# Patient Record
Sex: Female | Born: 1990 | Race: Black or African American | Hispanic: No | Marital: Single | Smoking: Former smoker
Health system: Southern US, Community
[De-identification: ages and names within clinical notes are randomized; demographics above are authoritative.]

## PROBLEM LIST (undated history)

## (undated) DIAGNOSIS — F419 Anxiety disorder, unspecified: Secondary | ICD-10-CM

## (undated) DIAGNOSIS — E0590 Thyrotoxicosis, unspecified without thyrotoxic crisis or storm: Secondary | ICD-10-CM

## (undated) DIAGNOSIS — K3184 Gastroparesis: Secondary | ICD-10-CM

## (undated) DIAGNOSIS — E0500 Thyrotoxicosis with diffuse goiter without thyrotoxic crisis or storm: Secondary | ICD-10-CM

## (undated) DIAGNOSIS — F319 Bipolar disorder, unspecified: Secondary | ICD-10-CM

## (undated) DIAGNOSIS — F32A Depression, unspecified: Secondary | ICD-10-CM

---
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
## 2012-01-20 DIAGNOSIS — F3130 Bipolar disorder, current episode depressed, mild or moderate severity, unspecified: Secondary | ICD-10-CM | POA: Insufficient documentation

## 2012-01-20 HISTORY — DX: Bipolar disorder, current episode depressed, mild or moderate severity, unspecified: F31.30

## 2015-10-27 DIAGNOSIS — Z9889 Other specified postprocedural states: Secondary | ICD-10-CM | POA: Insufficient documentation

## 2015-12-02 DIAGNOSIS — F419 Anxiety disorder, unspecified: Secondary | ICD-10-CM | POA: Insufficient documentation

## 2015-12-02 DIAGNOSIS — D72829 Elevated white blood cell count, unspecified: Secondary | ICD-10-CM | POA: Insufficient documentation

## 2016-04-17 DIAGNOSIS — D75839 Thrombocytosis, unspecified: Secondary | ICD-10-CM | POA: Insufficient documentation

## 2017-05-18 DIAGNOSIS — R45851 Suicidal ideations: Secondary | ICD-10-CM | POA: Insufficient documentation

## 2017-05-18 DIAGNOSIS — Z9151 Personal history of suicidal behavior: Secondary | ICD-10-CM

## 2017-05-18 HISTORY — DX: Personal history of suicidal behavior: Z91.51

## 2017-05-19 DIAGNOSIS — F1010 Alcohol abuse, uncomplicated: Secondary | ICD-10-CM

## 2017-05-19 HISTORY — DX: Alcohol abuse, uncomplicated: F10.10

## 2020-07-08 DIAGNOSIS — G8929 Other chronic pain: Secondary | ICD-10-CM

## 2020-07-08 DIAGNOSIS — N946 Dysmenorrhea, unspecified: Secondary | ICD-10-CM

## 2020-07-08 HISTORY — DX: Other chronic pain: G89.29

## 2020-07-08 HISTORY — DX: Dysmenorrhea, unspecified: N94.6

## 2021-03-15 DIAGNOSIS — K589 Irritable bowel syndrome 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: Secondary | ICD-10-CM | POA: Insufficient documentation

## 2021-03-15 DIAGNOSIS — R7989 Other specified abnormal findings of blood chemistry: Secondary | ICD-10-CM

## 2021-03-15 DIAGNOSIS — F1290 Cannabis use, unspecified, uncomplicated: Secondary | ICD-10-CM | POA: Insufficient documentation

## 2021-03-15 HISTORY — DX: Other specified abnormal findings of blood chemistry: R79.89

## 2021-03-15 HISTORY — DX: Cannabis use, unspecified, uncomplicated: F12.90

## 2021-04-25 ENCOUNTER — Other Ambulatory Visit: Payer: Self-pay

## 2021-04-25 ENCOUNTER — Encounter (HOSPITAL_COMMUNITY): Payer: Self-pay

## 2021-04-25 ENCOUNTER — Inpatient Hospital Stay (HOSPITAL_COMMUNITY)
Admission: EM | Admit: 2021-04-25 | Discharge: 2021-04-29 | DRG: 392 | Disposition: A | Discharge status: Home / Self Care | Payer: No Typology Code available for payment source | Attending: Internal Medicine | Admitting: Internal Medicine

## 2021-04-25 DIAGNOSIS — F319 Bipolar disorder, unspecified: Secondary | ICD-10-CM | POA: Diagnosis present

## 2021-04-25 DIAGNOSIS — E876 Hypokalemia: Secondary | ICD-10-CM | POA: Diagnosis present

## 2021-04-25 DIAGNOSIS — Z880 Allergy status to penicillin: Secondary | ICD-10-CM

## 2021-04-25 DIAGNOSIS — Z6841 Body Mass Index (BMI) 40.0 and over, adult: Secondary | ICD-10-CM

## 2021-04-25 DIAGNOSIS — Z79899 Other long term (current) drug therapy: Secondary | ICD-10-CM

## 2021-04-25 DIAGNOSIS — E0500 Thyrotoxicosis with diffuse goiter without thyrotoxic crisis or storm: Secondary | ICD-10-CM | POA: Diagnosis present

## 2021-04-25 DIAGNOSIS — R1110 Vomiting, unspecified: Secondary | ICD-10-CM | POA: Diagnosis present

## 2021-04-25 DIAGNOSIS — Z20822 Contact with and (suspected) exposure to covid-19: Secondary | ICD-10-CM | POA: Diagnosis present

## 2021-04-25 DIAGNOSIS — Z9079 Acquired absence of other genital organ(s): Secondary | ICD-10-CM

## 2021-04-25 DIAGNOSIS — F1210 Cannabis abuse, uncomplicated: Secondary | ICD-10-CM | POA: Diagnosis present

## 2021-04-25 DIAGNOSIS — Z888 Allergy status to other drugs, medicaments and biological substances status: Secondary | ICD-10-CM

## 2021-04-25 DIAGNOSIS — K3184 Gastroparesis: Secondary | ICD-10-CM | POA: Diagnosis not present

## 2021-04-25 DIAGNOSIS — R112 Nausea with vomiting, unspecified: Secondary | ICD-10-CM | POA: Diagnosis present

## 2021-04-25 DIAGNOSIS — I10 Essential (primary) hypertension: Secondary | ICD-10-CM | POA: Diagnosis present

## 2021-04-25 DIAGNOSIS — Z7151 Drug abuse counseling and surveillance of drug abuser: Secondary | ICD-10-CM

## 2021-04-25 DIAGNOSIS — F419 Anxiety disorder, unspecified: Secondary | ICD-10-CM | POA: Diagnosis present

## 2021-04-25 HISTORY — DX: Bipolar disorder, unspecified: F31.9

## 2021-04-25 HISTORY — DX: Anxiety disorder, unspecified: F41.9

## 2021-04-25 HISTORY — DX: Thyrotoxicosis with diffuse goiter without thyrotoxic crisis or storm: E05.00

## 2021-04-25 HISTORY — DX: Thyrotoxicosis, unspecified without thyrotoxic crisis or storm: E05.90

## 2021-04-25 HISTORY — DX: Depression, unspecified: F32.A

## 2021-04-25 HISTORY — DX: Gastroparesis: K31.84

## 2021-04-25 MED ORDER — ONDANSETRON 4 MG PO TBDP
4.0000 mg | ORAL_TABLET | Freq: Once | ORAL | Status: AC
  Administered 2021-04-25: 4 mg via ORAL
  Filled 2021-04-25: qty 1

## 2021-04-25 NOTE — ED Provider Triage Note (Signed)
Emergency Medicine Provider Triage Evaluation Note  Deborah Kelly , a 31 y.o. female  was evaluated in triage.  Pt complains of nausea and vomiting associated with abdominal pain x3-4 days. History of gastroparesis. Admits to smoking marijuana.   Review of Systems  Positive: Abdominal pain, N/V Negative: fever  Physical Exam  There were no vitals taken for this visit. Gen:   Awake, no distress   Resp:  Normal effort  MSK:   Moves extremities without difficulty  Other:  +epigastric tenderness  Medical Decision Making  Medically screening exam initiated at 11:26 PM.  Appropriate orders placed.  Deborah Kelly was informed that the remainder of the evaluation will be completed by another provider, this initial triage assessment does not replace that evaluation, and the importance of remaining in the ED until their evaluation is complete.  Abdominal labs Zofran given in triage   Jesusita Oka 04/25/21 2334

## 2021-04-25 NOTE — ED Triage Notes (Signed)
Patient BIB [facility] from home. Abdominal pain, nausea, vomiting for 4 days. Has not been able to eat/drink for 3 days. History of gastroparesis.

## 2021-04-26 ENCOUNTER — Emergency Department (HOSPITAL_COMMUNITY): Payer: No Typology Code available for payment source

## 2021-04-26 ENCOUNTER — Encounter (HOSPITAL_COMMUNITY): Payer: Self-pay

## 2021-04-26 DIAGNOSIS — E876 Hypokalemia: Secondary | ICD-10-CM

## 2021-04-26 DIAGNOSIS — R112 Nausea with vomiting, unspecified: Secondary | ICD-10-CM | POA: Diagnosis not present

## 2021-04-26 DIAGNOSIS — F1210 Cannabis abuse, uncomplicated: Secondary | ICD-10-CM | POA: Diagnosis present

## 2021-04-26 DIAGNOSIS — K3184 Gastroparesis: Secondary | ICD-10-CM

## 2021-04-26 DIAGNOSIS — I10 Essential (primary) hypertension: Secondary | ICD-10-CM

## 2021-04-26 DIAGNOSIS — F319 Bipolar disorder, unspecified: Secondary | ICD-10-CM

## 2021-04-26 LAB — URINALYSIS, ROUTINE W REFLEX MICROSCOPIC
Bacteria, UA: NONE SEEN
Bilirubin Urine: NEGATIVE
Glucose, UA: NEGATIVE mg/dL
Hgb urine dipstick: NEGATIVE
Ketones, ur: 5 mg/dL — AB
Leukocytes,Ua: NEGATIVE
Nitrite: NEGATIVE
Protein, ur: 100 mg/dL — AB
Specific Gravity, Urine: 1.034 — ABNORMAL HIGH (ref 1.005–1.030)
pH: 5 (ref 5.0–8.0)

## 2021-04-26 LAB — CBC WITH DIFFERENTIAL/PLATELET
Abs Immature Granulocytes: 0.07 10*3/uL (ref 0.00–0.07)
Basophils Absolute: 0.1 10*3/uL (ref 0.0–0.1)
Basophils Relative: 1 %
Eosinophils Absolute: 0 10*3/uL (ref 0.0–0.5)
Eosinophils Relative: 0 %
HCT: 48.1 % — ABNORMAL HIGH (ref 36.0–46.0)
Hemoglobin: 15.9 g/dL — ABNORMAL HIGH (ref 12.0–15.0)
Immature Granulocytes: 1 %
Lymphocytes Relative: 19 %
Lymphs Abs: 2.4 10*3/uL (ref 0.7–4.0)
MCH: 29.7 pg (ref 26.0–34.0)
MCHC: 33.1 g/dL (ref 30.0–36.0)
MCV: 89.7 fL (ref 80.0–100.0)
Monocytes Absolute: 1 10*3/uL (ref 0.1–1.0)
Monocytes Relative: 8 %
Neutro Abs: 8.9 10*3/uL — ABNORMAL HIGH (ref 1.7–7.7)
Neutrophils Relative %: 71 %
Platelets: 401 10*3/uL — ABNORMAL HIGH (ref 150–400)
RBC: 5.36 MIL/uL — ABNORMAL HIGH (ref 3.87–5.11)
RDW: 13.3 % (ref 11.5–15.5)
WBC: 12.5 10*3/uL — ABNORMAL HIGH (ref 4.0–10.5)
nRBC: 0 % (ref 0.0–0.2)

## 2021-04-26 LAB — RESP PANEL BY RT-PCR (FLU A&B, COVID) ARPGX2
Influenza A by PCR: NEGATIVE
Influenza B by PCR: NEGATIVE
SARS Coronavirus 2 by RT PCR: NEGATIVE

## 2021-04-26 LAB — COMPREHENSIVE METABOLIC PANEL
ALT: 36 U/L (ref 0–44)
AST: 38 U/L (ref 15–41)
Albumin: 5 g/dL (ref 3.5–5.0)
Alkaline Phosphatase: 78 U/L (ref 38–126)
Anion gap: 12 (ref 5–15)
BUN: 11 mg/dL (ref 6–20)
CO2: 29 mmol/L (ref 22–32)
Calcium: 9.6 mg/dL (ref 8.9–10.3)
Chloride: 98 mmol/L (ref 98–111)
Creatinine, Ser: 1.01 mg/dL — ABNORMAL HIGH (ref 0.44–1.00)
GFR, Estimated: >60 mL/min (ref >60)
Glucose, Bld: 126 mg/dL — ABNORMAL HIGH (ref 70–99)
Potassium: 3.4 mmol/L — ABNORMAL LOW (ref 3.5–5.1)
Sodium: 139 mmol/L (ref 135–145)
Total Bilirubin: 0.7 mg/dL (ref 0.3–1.2)
Total Protein: 8.9 g/dL — ABNORMAL HIGH (ref 6.5–8.1)

## 2021-04-26 LAB — LIPASE, BLOOD: Lipase: 23 U/L (ref 11–51)

## 2021-04-26 LAB — I-STAT BETA HCG BLOOD, ED (MC, WL, AP ONLY): I-stat hCG, quantitative: <5 m[IU]/mL (ref <5)

## 2021-04-26 LAB — MAGNESIUM: Magnesium: 2.2 mg/dL (ref 1.7–2.4)

## 2021-04-26 MED ORDER — SODIUM CHLORIDE 0.9 % IV BOLUS
1000.0000 mL | Freq: Once | INTRAVENOUS | Status: AC
  Administered 2021-04-26: 1000 mL via INTRAVENOUS

## 2021-04-26 MED ORDER — LORAZEPAM 2 MG/ML IJ SOLN
1.0000 mg | Freq: Once | INTRAMUSCULAR | Status: AC
Start: 2021-04-26 — End: 2021-04-26
  Administered 2021-04-26: 1 mg via INTRAVENOUS
  Filled 2021-04-26: qty 1

## 2021-04-26 MED ORDER — KETOROLAC TROMETHAMINE 15 MG/ML IJ SOLN
15.0000 mg | Freq: Three times a day (TID) | INTRAMUSCULAR | Status: DC | PRN
  Administered 2021-04-26 – 2021-04-29 (×6): 15 mg via INTRAVENOUS
  Filled 2021-04-26 (×6): qty 1

## 2021-04-26 MED ORDER — PROCHLORPERAZINE EDISYLATE 10 MG/2ML IJ SOLN
10.0000 mg | Freq: Four times a day (QID) | INTRAMUSCULAR | Status: DC | PRN
Start: 2021-04-26 — End: 2021-04-29
  Administered 2021-04-27: 10 mg via INTRAVENOUS
  Filled 2021-04-26: qty 2

## 2021-04-26 MED ORDER — POTASSIUM CHLORIDE 10 MEQ/100ML IV SOLN
10.0000 meq | INTRAVENOUS | Status: AC
  Administered 2021-04-26 (×4): 10 meq via INTRAVENOUS
  Filled 2021-04-26 (×4): qty 100

## 2021-04-26 MED ORDER — DROPERIDOL 2.5 MG/ML IJ SOLN
2.5000 mg | Freq: Once | INTRAMUSCULAR | Status: DC
Start: 2021-04-26 — End: 2021-04-26

## 2021-04-26 MED ORDER — METOPROLOL TARTRATE 5 MG/5ML IV SOLN: 5.0000 mg | Freq: Four times a day (QID) | INTRAVENOUS | Status: DC | PRN

## 2021-04-26 MED ORDER — HYDROMORPHONE HCL 1 MG/ML IJ SOLN
1.0000 mg | Freq: Four times a day (QID) | INTRAMUSCULAR | Status: DC | PRN
  Administered 2021-04-26 – 2021-04-29 (×11): 1 mg via INTRAVENOUS
  Filled 2021-04-26 (×11): qty 1

## 2021-04-26 MED ORDER — HALOPERIDOL LACTATE 5 MG/ML IJ SOLN
5.0000 mg | Freq: Two times a day (BID) | INTRAMUSCULAR | Status: DC
Start: 2021-04-26 — End: 2021-04-29
  Administered 2021-04-26 – 2021-04-29 (×6): 5 mg via INTRAMUSCULAR
  Filled 2021-04-26 (×6): qty 1

## 2021-04-26 MED ORDER — HYDROMORPHONE HCL 1 MG/ML IJ SOLN
1.0000 mg | Freq: Once | INTRAMUSCULAR | Status: AC
  Administered 2021-04-26: 1 mg via INTRAVENOUS
  Filled 2021-04-26: qty 1

## 2021-04-26 MED ORDER — DROPERIDOL 2.5 MG/ML IJ SOLN
1.2500 mg | Freq: Once | INTRAMUSCULAR | Status: AC
  Administered 2021-04-26: 1.25 mg via INTRAVENOUS
  Filled 2021-04-26: qty 2

## 2021-04-26 MED ORDER — PANTOPRAZOLE SODIUM 40 MG IV SOLR
40.0000 mg | INTRAVENOUS | Status: DC
  Administered 2021-04-26 – 2021-04-28 (×3): 40 mg via INTRAVENOUS
  Filled 2021-04-26 (×4): qty 10

## 2021-04-26 MED ORDER — FENTANYL CITRATE PF 50 MCG/ML IJ SOSY
50.0000 ug | PREFILLED_SYRINGE | Freq: Once | INTRAMUSCULAR | Status: AC
  Administered 2021-04-26: 50 ug via INTRAVENOUS
  Filled 2021-04-26: qty 1

## 2021-04-26 MED ORDER — IOHEXOL 300 MG/ML  SOLN
100.0000 mL | Freq: Once | INTRAMUSCULAR | Status: AC | PRN
  Administered 2021-04-26: 100 mL via INTRAVENOUS

## 2021-04-26 MED ORDER — LORAZEPAM 2 MG/ML IJ SOLN
1.0000 mg | Freq: Four times a day (QID) | INTRAMUSCULAR | Status: DC | PRN
  Administered 2021-04-26 – 2021-04-27 (×5): 1 mg via INTRAVENOUS
  Filled 2021-04-26 (×5): qty 1

## 2021-04-26 MED ORDER — SODIUM CHLORIDE 0.9 % IV SOLN
12.5000 mg | Freq: Four times a day (QID) | INTRAVENOUS | Status: DC | PRN
  Administered 2021-04-26 – 2021-04-28 (×4): 12.5 mg via INTRAVENOUS
  Filled 2021-04-26: qty 12.5
  Filled 2021-04-26 (×2): qty 0.5
  Filled 2021-04-26 (×3): qty 12.5

## 2021-04-26 MED ORDER — SODIUM CHLORIDE 0.9 % IV SOLN: INTRAVENOUS | Status: DC

## 2021-04-26 MED ORDER — FENTANYL CITRATE PF 50 MCG/ML IJ SOSY
50.0000 ug | PREFILLED_SYRINGE | Freq: Once | INTRAMUSCULAR | Status: AC
Start: 2021-04-26 — End: 2021-04-26
  Administered 2021-04-26: 50 ug via INTRAVENOUS
  Filled 2021-04-26: qty 1

## 2021-04-26 NOTE — ED Notes (Signed)
Unsuccessful IV insertion x3. IV team consulted.

## 2021-04-26 NOTE — H&P (Signed)
History and Physical    Patient: Deborah Kelly [id number] DOB: [birth year] DOA: 04/25/2021 DOS: the patient was seen and examined on 04/26/2021 PCP: Pcp, No  Patient coming from: Home  Chief Complaint:  Chief Complaint  Patient presents with   Abdominal Pain    HPI: Deborah Kelly is a 31 y.o. female with medical history significant of bipolar d/o, gastroparesis, graves disease. Presenting with intractable N/V/ab pain. She reports that she just moved to [place] from [place]. She has yet to establish care with GI here, but in [place] she was followed regularly. She reports that she seems to have a gastroparesis flare every couple of months. She is not a diabetic. She reports that 4 days ago she started having N/V. She was unable to keep her normal medications down -- or anything really. She began having hypogastric abdominal pain a couple of days ago. It felt like " someone trying to pull intestines through the belly button." When her symptoms didn't resolve last night, she decided to come to the ED for assistance.  Of note, she reports that she still uses marijuana "socially". Otherwise, she denies any other aggravating or alleviating factors.    Review of Systems: As mentioned in the history of present illness. All other systems reviewed and are negative. Past Medical History:  Diagnosis Date   Anxiety    Bipolar 1 disorder ([place])    Depression    Gastroparesis    Graves disease    Hyperthyroidism    History reviewed. No pertinent surgical history. Social History:  has no history on file for tobacco use, alcohol use, and drug use.  Allergies  Allergen Reactions   Amlodipine Hives   Dicyclomine Other (See Comments)    Pt states it makes her hands shake; pt also states a rash.    Penicillins     History reviewed. No pertinent family history.  Prior to Admission medications   Medication Sig Start Date End Date Taking? Authorizing Provider  acetaminophen-codeine  (TYLENOL #4) 300-60 MG tablet Take 1 tablet by mouth daily as needed for pain.   Yes [provider]  atenolol-chlorthalidone (TENORETIC) 100-25 MG tablet Take 1 tablet by mouth every evening. 09/16/20  Yes [provider]  clonazePAM (KLONOPIN) 1 MG tablet Take 1 mg by mouth 2 (two) times daily as needed for anxiety. 04/17/21  Yes [provider]  haloperidol (HALDOL) 5 MG tablet Take 10 mg by mouth at bedtime. 02/11/21  Yes [provider]  lamoTRIgine (LAMICTAL) 200 MG tablet Take 200 mg by mouth daily at 12 noon. 02/11/21  Yes [provider]  omeprazole (PRILOSEC OTC) 20 MG tablet Take 1 tablet by mouth daily as needed (for acid reflux). 08/30/20  Yes [provider]  promethazine (PHENERGAN) 12.5 MG tablet Take 25 mg by mouth every 6 (six) hours as needed for nausea or vomiting. 03/18/21  Yes [provider]  QUEtiapine (SEROQUEL) 300 MG tablet Take 300 mg by mouth at bedtime. 03/18/21  Yes [provider]    Physical Exam: Vitals:   04/26/21 0430 04/26/21 0530 04/26/21 0630 04/26/21 0728  BP: (!) 137/102 (!) 147/89 (!) 151/92 138/69  Pulse: 60 79 72 71  Resp: 17 15 18 18   Temp:      TempSrc:      SpO2: 99% 98% 98% 99%   General: 31 y.o. female resting in bed in NAD Eyes: PERRL, normal sclera ENMT: Nares patent w/o discharge, orophaynx clear, dentition normal, ears w/o discharge/lesions/ulcers Neck: Supple,  trachea midline Cardiovascular: RRR, +S1, S2, no m/g/r, equal pulses throughout Respiratory: CTABL, no w/r/r, normal WOB GI: BS+, ND, mild TTP LLQ, RLQ, no masses noted, no organomegaly noted MSK: No e/c/c Skin: No rashes, bruises, ulcerations noted Neuro: A&O x 3, no focal deficits Psyc: Appropriate interaction and affect, calm/cooperative  Data Reviewed:  K+ 3.4 WBC 12.5 Hgb 15.9 Plt 401  CT ab/pelvis w/ con: Negative CT Abdomen and Pelvis.  Assessment and Plan: No notes have been filed under this  hospital service. Service: Hospitalist Intractable N/V Abdominal pain Hx of gastroparesis Hx of cyclical vomiting syndrome     - place in obs, med-surg     - check EKG     - reports that reglan is ineffective for her; will start with phenergan/compazine/ativan; if not achieving better control, consider addition of erythromycin     - PPI     - was following with GI in [place]; just moved here, needs to establish with GI here; ambulatory referral at discharge     - NPO except ice chips for now; add IVF     - limit narcotic use     - have counseled against further mj use  Marijuana abuse     - counseled against further mj use  Hypokalemia     - replace K+, check Mg2+  HTN     - PRN BP meds available     - resume home regimen when she is tolerating PO  Bipolar d/o     - continue home regimen when tolerating PO; has PRN meds for now; will convert her haldol to IM  Advance Care Planning:   Code Status: FULL  Consults: None  Family Communication: None at bedside.  Severity of Illness: The appropriate patient status for this patient is OBSERVATION. Observation status is judged to be reasonable and necessary in order to provide the required intensity of service to ensure the patient's safety. The patient's presenting symptoms, physical exam findings, and initial radiographic and laboratory data in the context of their medical condition is felt to place them at decreased risk for further clinical deterioration. Furthermore, it is anticipated that the patient will be medically stable for discharge from the hospital within 2 midnights of admission.   Author: Jonnie Finner, DO 04/26/2021 9:06 AM  For on call review [email or web address].

## 2021-04-26 NOTE — ED Provider Notes (Signed)
[facility]-EMERGENCY DEPT Provider Note   CSN: [id number] Arrival date & time: 04/25/21  2319     History  Chief Complaint  Patient presents with   Abdominal Pain    Deborah Kelly is a 31 y.o. female.  Presented to the emergency department with concern for abdominal pain.  She reports that she suffers from gastroparesis, episode today feels similar to prior.  Current episode ongoing for the past 3 to 4 days.  Epigastric pain, multiple episodes of nausea and vomiting.  Pain is moderate to severe, upper abdomen.  Denies any pelvic pain, vaginal discharge, dysuria or hematuria.  Reports that she recently moved to [place] area from [place].  Had received most of her care in the [place] [facility] before.  HPI     Home Medications Prior to Admission medications   Medication Sig Start Date End Date Taking? Authorizing Provider  acetaminophen-codeine (TYLENOL #4) 300-60 MG tablet Take 1 tablet by mouth daily as needed for pain.   Yes [provider]  atenolol-chlorthalidone (TENORETIC) 100-25 MG tablet Take 1 tablet by mouth every evening. 09/16/20  Yes [provider]  clonazePAM (KLONOPIN) 1 MG tablet Take 1 mg by mouth 2 (two) times daily as needed for anxiety. 04/17/21  Yes [provider]  haloperidol (HALDOL) 5 MG tablet Take 10 mg by mouth at bedtime. 02/11/21  Yes [provider]  lamoTRIgine (LAMICTAL) 200 MG tablet Take 200 mg by mouth daily at 12 noon. 02/11/21  Yes [provider]  omeprazole (PRILOSEC OTC) 20 MG tablet Take 1 tablet by mouth daily as needed (for acid reflux). 08/30/20  Yes [provider]  promethazine (PHENERGAN) 12.5 MG tablet Take 25 mg by mouth every 6 (six) hours as needed for nausea or vomiting. 03/18/21  Yes [provider]  QUEtiapine (SEROQUEL) 300 MG tablet Take 300 mg by mouth at bedtime. 03/18/21  Yes [provider]      Allergies    Amlodipine,  Dicyclomine, and Penicillins    Review of Systems   Review of Systems  Constitutional:  Negative for chills and fever.  HENT:  Negative for ear pain and sore throat.   Eyes:  Negative for pain and visual disturbance.  Respiratory:  Negative for cough and shortness of breath.   Cardiovascular:  Negative for chest pain and palpitations.  Gastrointestinal:  Positive for abdominal pain, nausea and vomiting.  Genitourinary:  Negative for dysuria and hematuria.  Musculoskeletal:  Negative for arthralgias and back pain.  Skin:  Negative for color change and rash.  Neurological:  Negative for seizures and syncope.  All other systems reviewed and are negative.  Physical Exam Updated Vital Signs BP (!) 151/92    Pulse 72    Temp 99 F (37.2 C) (Oral)    Resp 18    SpO2 98%  Physical Exam Vitals and nursing note reviewed.  Constitutional:      General: She is not in acute distress.    Appearance: She is well-developed.  HENT:     Head: Normocephalic and atraumatic.  Eyes:     Conjunctiva/sclera: Conjunctivae normal.  Cardiovascular:     Rate and Rhythm: Normal rate and regular rhythm.     Heart sounds: No murmur heard. Pulmonary:     Effort: Pulmonary effort is normal. No respiratory distress.     Breath sounds: Normal breath sounds.  Abdominal:     Palpations: Abdomen is soft.     Tenderness: There is abdominal  tenderness in the epigastric area and left upper quadrant. There is no guarding or rebound. Negative signs include Murphy's sign and McBurney's sign.  Musculoskeletal:        General: No swelling.     Cervical back: Neck supple.  Skin:    General: Skin is warm and dry.     Capillary Refill: Capillary refill takes less than 2 seconds.  Neurological:     Mental Status: She is alert.  Psychiatric:        Mood and Affect: Mood normal.    ED Results / Procedures / Treatments   Labs (all labs ordered are listed, but only abnormal results are displayed) Labs Reviewed  CBC  WITH DIFFERENTIAL/PLATELET - Abnormal; Notable for the following components:      Result Value   WBC 12.5 (*)    RBC 5.36 (*)    Hemoglobin 15.9 (*)    HCT 48.1 (*)    Platelets 401 (*)    Neutro Abs 8.9 (*)    All other components within normal limits  COMPREHENSIVE METABOLIC PANEL - Abnormal; Notable for the following components:   Potassium 3.4 (*)    Glucose, Bld 126 (*)    Creatinine, Ser 1.01 (*)    Total Protein 8.9 (*)    All other components within normal limits  URINALYSIS, ROUTINE W REFLEX MICROSCOPIC - Abnormal; Notable for the following components:   Color, Urine AMBER (*)    Specific Gravity, Urine 1.034 (*)    Ketones, ur 5 (*)    Protein, ur 100 (*)    All other components within normal limits  LIPASE, BLOOD  I-STAT BETA HCG BLOOD, ED (MC, WL, AP ONLY)    EKG None  Radiology No results found.  Procedures Ultrasound ED Peripheral IV (Provider)  Date/Time: 04/26/2021 4:29 AM Performed by: Lucrezia Starch, MD Authorized by: Lucrezia Starch, MD   Procedure details:    Indications: hydration, multiple failed IV attempts and poor IV access     Skin Prep: isopropyl alcohol     Location:  Left AC   Angiocath:  20 G   Bedside Ultrasound Guided: Yes     Images: not archived     Patient tolerated procedure without complications: Yes     Dressing applied: Yes      Medications Ordered in ED Medications  promethazine (PHENERGAN) 12.5 mg in sodium chloride 0.9 % 50 mL IVPB (has no administration in time range)  ondansetron (ZOFRAN-ODT) disintegrating tablet 4 mg (4 mg Oral Given 04/25/21 2350)  droperidol (INAPSINE) 2.5 MG/ML injection 1.25 mg (1.25 mg Intravenous Given 04/26/21 0340)  HYDROmorphone (DILAUDID) injection 1 mg (1 mg Intravenous Given 04/26/21 0340)  sodium chloride 0.9 % bolus 1,000 mL (0 mLs Intravenous Stopped 04/26/21 0514)  LORazepam (ATIVAN) injection 1 mg (1 mg Intravenous Given 04/26/21 0439)  fentaNYL (SUBLIMAZE) injection 50 mcg (50  mcg Intravenous Given 04/26/21 0439)    ED Course/ Medical Decision Making/ A&P                           Medical Decision Making Risk Prescription drug management.   31 year old presents to ER with concern for nausea vomiting and abdominal pain.  She endorses history of gastroparesis and reports this feels similar to prior.  Also had endorsed marijuana abuse in triage.  Suspect either gastroparesis or cyclic vomiting from cannabis abuse.  On exam she is in no distress and has stable vital signs,  mild tenderness in the left upper quadrant and epigastric regions noted.  Lab work reviewed, mild leukocytosis, no transaminitis.  Normal lipase.  UA without infection.  Provided symptomatic control.   On reassessment her symptoms were improving and attempted p.o. trial.  However she states that she felt nauseated after drinking some fluids.  Will provide additional dose of antiemetic with Phenergan.  While awaiting additional antiemetic and repeat p.o. trial and reassessment, signed out to Dr. Armandina Gemma.  Plan and disposition pending reassessment.          Final Clinical Impression(s) / ED Diagnoses Final diagnoses:  Gastroparesis    Rx / DC Orders ED Discharge Orders     None         Lucrezia Starch, MD 04/26/21 [phone]

## 2021-04-26 NOTE — ED Provider Notes (Signed)
°  Physical Exam  BP (!) 151/92    Pulse 72    Temp 99 F (37.2 C) (Oral)    Resp 18    SpO2 98%     Procedures  Procedures  ED Course / MDM    Medical Decision Making Amount and/or Complexity of Data Reviewed Radiology: ordered.  Risk Prescription drug management. Decision regarding hospitalization.   89F w/ gastroparesis, still not tolerating PO, plan for Phenergan and repeat PO challenge.   0734 I assessed the patient bedside.  The patient states that she is complaining of 8 out of 10 periumbilical pain with some radiation to her right lower quadrant.  She states that she her last bowel movement was 4 days ago.  She states that she thinks she has been passing gas over the last 24 hours.  She describes a sharp pain periumbilically that "feels like someone is trying to rip my intestines out through my bellybutton."  IV fentanyl ordered.  Her Phenergan for nausea and vomiting was being hooked up bedside.  I reviewed the patient's laboratory work-up.  She does have a leukocytosis to 12.5.  I examined the patient's abdomen.  She has periumbilical and right lower quadrant tenderness to palpation.  The patient states that her fallopian tube was removed previously but that is her only intra-abdominal surgery.  I have moderate concern for appendicitis due to a leukocytosis, anorexia, periumbilical and radiating right lower quadrant abdominal pain in the setting of nausea and vomiting.  The patient's temperature was low-grade elevated to 99.  No fever.  We will evaluate further with a CT abdomen pelvis, administer pain medications and antiemetics and reassess.  0853 CT abdomen pelvis negative for evidence of appendicitis, negative for other acute abnormalities.  The patient is still unable to tolerate oral intake despite IV Phenergan.  Hospitalist medicine consulted for admission for rehydration in the setting of severe gastroparesis. Dr. Marylyn Ishihara accepted the patient in admission.    Regan Lemming, MD 04/26/21 1108

## 2021-04-27 DIAGNOSIS — Z880 Allergy status to penicillin: Secondary | ICD-10-CM | POA: Diagnosis not present

## 2021-04-27 DIAGNOSIS — F1210 Cannabis abuse, uncomplicated: Secondary | ICD-10-CM | POA: Diagnosis present

## 2021-04-27 DIAGNOSIS — K3184 Gastroparesis: Principal | ICD-10-CM

## 2021-04-27 DIAGNOSIS — Z79899 Other long term (current) drug therapy: Secondary | ICD-10-CM | POA: Diagnosis not present

## 2021-04-27 DIAGNOSIS — E876 Hypokalemia: Secondary | ICD-10-CM | POA: Diagnosis present

## 2021-04-27 DIAGNOSIS — R1110 Vomiting, unspecified: Secondary | ICD-10-CM | POA: Diagnosis present

## 2021-04-27 DIAGNOSIS — Z6841 Body Mass Index (BMI) 40.0 and over, adult: Secondary | ICD-10-CM | POA: Diagnosis not present

## 2021-04-27 DIAGNOSIS — Z20822 Contact with and (suspected) exposure to covid-19: Secondary | ICD-10-CM | POA: Diagnosis present

## 2021-04-27 DIAGNOSIS — I10 Essential (primary) hypertension: Secondary | ICD-10-CM | POA: Diagnosis present

## 2021-04-27 DIAGNOSIS — Z7151 Drug abuse counseling and surveillance of drug abuser: Secondary | ICD-10-CM | POA: Diagnosis not present

## 2021-04-27 DIAGNOSIS — R112 Nausea with vomiting, unspecified: Secondary | ICD-10-CM | POA: Diagnosis not present

## 2021-04-27 DIAGNOSIS — Z9079 Acquired absence of other genital organ(s): Secondary | ICD-10-CM | POA: Diagnosis not present

## 2021-04-27 DIAGNOSIS — F319 Bipolar disorder, unspecified: Secondary | ICD-10-CM | POA: Diagnosis present

## 2021-04-27 DIAGNOSIS — F419 Anxiety disorder, unspecified: Secondary | ICD-10-CM | POA: Diagnosis present

## 2021-04-27 DIAGNOSIS — Z888 Allergy status to other drugs, medicaments and biological substances status: Secondary | ICD-10-CM | POA: Diagnosis not present

## 2021-04-27 DIAGNOSIS — E0500 Thyrotoxicosis with diffuse goiter without thyrotoxic crisis or storm: Secondary | ICD-10-CM | POA: Diagnosis present

## 2021-04-27 HISTORY — DX: Vomiting, unspecified: R11.10

## 2021-04-27 LAB — COMPREHENSIVE METABOLIC PANEL
ALT: 51 U/L — ABNORMAL HIGH (ref 0–44)
AST: 38 U/L (ref 15–41)
Albumin: 3.7 g/dL (ref 3.5–5.0)
Alkaline Phosphatase: 58 U/L (ref 38–126)
Anion gap: 5 (ref 5–15)
BUN: 10 mg/dL (ref 6–20)
CO2: 27 mmol/L (ref 22–32)
Calcium: 7.9 mg/dL — ABNORMAL LOW (ref 8.9–10.3)
Chloride: 108 mmol/L (ref 98–111)
Creatinine, Ser: 0.69 mg/dL (ref 0.44–1.00)
GFR, Estimated: >60 mL/min (ref >60)
Glucose, Bld: 85 mg/dL (ref 70–99)
Potassium: 2.9 mmol/L — ABNORMAL LOW (ref 3.5–5.1)
Sodium: 140 mmol/L (ref 135–145)
Total Bilirubin: 0.8 mg/dL (ref 0.3–1.2)
Total Protein: 6.2 g/dL — ABNORMAL LOW (ref 6.5–8.1)

## 2021-04-27 LAB — CBC
HCT: 38.3 % (ref 36.0–46.0)
Hemoglobin: 12.4 g/dL (ref 12.0–15.0)
MCH: 29.6 pg (ref 26.0–34.0)
MCHC: 32.4 g/dL (ref 30.0–36.0)
MCV: 91.4 fL (ref 80.0–100.0)
Platelets: 308 10*3/uL (ref 150–400)
RBC: 4.19 MIL/uL (ref 3.87–5.11)
RDW: 13.3 % (ref 11.5–15.5)
WBC: 9.6 10*3/uL (ref 4.0–10.5)
nRBC: 0 % (ref 0.0–0.2)

## 2021-04-27 LAB — HIV ANTIBODY (ROUTINE TESTING W REFLEX): HIV Screen 4th Generation wRfx: NONREACTIVE

## 2021-04-27 MED ORDER — CLONAZEPAM 0.125 MG PO TBDP
1.0000 mg | ORAL_TABLET | Freq: Two times a day (BID) | ORAL | Status: DC
  Administered 2021-04-27 – 2021-04-29 (×5): 1 mg via ORAL
  Filled 2021-04-27 (×5): qty 8

## 2021-04-27 MED ORDER — LAMOTRIGINE 100 MG PO TABS
200.0000 mg | ORAL_TABLET | Freq: Every day | ORAL | Status: DC
  Administered 2021-04-27 – 2021-04-28 (×2): 200 mg via ORAL
  Filled 2021-04-27 (×2): qty 2

## 2021-04-27 MED ORDER — QUETIAPINE FUMARATE 50 MG PO TABS
300.0000 mg | ORAL_TABLET | Freq: Every day | ORAL | Status: DC
  Administered 2021-04-27 – 2021-04-28 (×2): 300 mg via ORAL
  Filled 2021-04-27 (×2): qty 6

## 2021-04-27 MED ORDER — POTASSIUM CHLORIDE 10 MEQ/100ML IV SOLN
10.0000 meq | INTRAVENOUS | Status: AC
  Administered 2021-04-27 (×4): 10 meq via INTRAVENOUS
  Filled 2021-04-27: qty 100

## 2021-04-27 NOTE — Assessment & Plan Note (Addendum)
Could be contributing to cyclic vomiting.  Counseling was done

## 2021-04-27 NOTE — Assessment & Plan Note (Addendum)
History of cyclic vomiting syndrome.  Patient received Phenergan, Haldol and Klonopin, Zofran ODT.  Reglan not effective as per the patient..  Continue PPI on discharge..  Follows up with GI in [place] but has recently moved to this area..  Also received IV Dilaudid for pain with Toradol.    Nondiabetic gastroparesis.  Had previous admissions for the same.Counseled about dietary modification including low fiber, low-fat small frequent meals.  At this time patient has tolerated oral diet and wishes to go home.  Advised soft/full liquids and advance as tolerated.

## 2021-04-27 NOTE — Assessment & Plan Note (Addendum)
On atenolol chlorthalidone at home.  Will be given potassium supplementation as well.

## 2021-04-27 NOTE — Assessment & Plan Note (Addendum)
Received significant IV supplementation during hospitalization.  Will be given oral potassium on discharge as well.

## 2021-04-27 NOTE — Hospital Course (Addendum)
Deborah Kelly is a 31 y.o. female with past medical history of bipolar disorder, gastroparesis, Graves' disease presented to hospital with intractable nausea,vomiting and abdominal pain.  Patient recently moved from [place] to [place] and has not established care here.  She does have a history of gastroparesis flare every few months but this is not due to diabetes.  She was unable to keep anything down at home and presented to the hospital.  She does however smoke marijuana  "socially".  Patient was then admitted to hospital for further evaluation and treatment.

## 2021-04-27 NOTE — Assessment & Plan Note (Addendum)
Continue Haldol,Lamictal Seroquel and Klonopin on discharge.  Prescription for Haldol given.

## 2021-04-27 NOTE — Progress Notes (Signed)
PROGRESS NOTE    Deborah Kelly  RXV:[id number] DOB: 22-Feb-1991 DOA: 04/25/2021 PCP: Pcp, No    Brief Narrative:  Deborah Kelly is a 31 y.o. female with past medical history of bipolar disorder, gastroparesis, Graves' disease presented to hospital with intractable nausea vomiting and abdominal pain.  Patient recently moved from [place] to [place] and has not established care here.  She does have a history of gastroparesis flare every few months but this is not diabetes.  She was unable to keep anything down at home and presented to the hospital.  She did however smoke marijuana  "socially".  Patient was then admitted to hospital for further evaluation and treatment.     Assessment and Plan: * Intractable nausea and vomiting- (present on admission) History of cyclic vomiting syndrome.  On Phenergan Compazine and Ativan.  Reglan not effective.  Continue PPI.  Follows up with GI in [place].  We will limit narcotic usage.  Counseled against marijuana usage.  We will start clears today.  Nondiabetic gastroparesis.  Had previous admissions for the same  Gastroparesis Continue supportive care.  Ineffective Reglan as per the patient.  Counseled against marijuana usage.  Hypokalemia Significant hypokalemia.  We will replace with IV KCl.  Continue IV fluids.  Bipolar disorder (HCC) On Haldol IM at this time.  Patient is on Haldol p.o. at bedtime, Lamictal daily and Seroquel at nighttime.  She also takes Klonopin twice a day.  Will resume Lamictal Seroquel and Klonopin.  HTN (hypertension) On atenolol chlorthalidone at home.  Currently on IV metoprolol.  Marijuana abuse- (present on admission) Could be contributing to cyclic vomiting.  Counseling done.     DVT prophylaxis: SCDs Start: 04/26/21 1622   Code Status:     Code Status: Full Code  Disposition: Home likely in 1 to 2 days  Status is: Observation The patient will require care spanning > 2 midnights and should be moved to  inpatient because: IV fluids, antiemetics, gastroparesis, poor oral intolerance    Family Communication: With the patient at bedside  Consultants:  None  Procedures:  None  Antimicrobials:  None  Anti-infectives (From admission, onward)    None        Subjective: Today, patient was seen and examined at bedside.  Patient still complains of nausea but no overt vomiting.  N.p.o. still.  Has mild abdominal pain.  States that she cannot sleep at all yesterday.  Feels anxious.  Wishes to be resumed on her home medication regimen.  Objective: Vitals:   04/26/21 2151 04/27/21 0112 04/27/21 0515 04/27/21 0932  BP: (!) 105/59 128/63 110/69 119/70  Pulse: (!) 54 66 (!) 55 70  Resp: 16 16 16 18   Temp: 98.9 F (37.2 C) 98.5 F (36.9 C) 98.3 F (36.8 C) 97.7 F (36.5 C)  TempSrc: Oral Oral Oral Oral  SpO2: 98% 99% 100% 100%  Weight:      Height:        Intake/Output Summary (Last 24 hours) at 04/27/2021 1305 Last data filed at 04/27/2021 0600 Gross per 24 hour  Intake 2442.79 ml  Output --  Net 2442.79 ml   Filed Weights   04/26/21 1618  Weight: (!) 147.2 kg   Body mass index is 49.34 kg/m.   Physical Examination:  General: Morbidly obese built, not in obvious distress HENT:   No scleral pallor or icterus noted. Oral mucosa is moist.  Chest:  Clear breath sounds.  Diminished breath sounds bilaterally. No crackles or wheezes.  CVS: S1 &  S2 heard. No murmur.  Regular rate and rhythm. Abdomen: Soft, mild tenderness over the lower abdomen, nondistended.  Bowel sounds are heard.   Extremities: No cyanosis, clubbing or edema.  Peripheral pulses are palpable. Psych: Alert, awake and oriented, normal mood CNS:  No cranial nerve deficits.  Power equal in all extremities.   Skin: Warm and dry.  No rashes noted.  Data Reviewed:   CBC: Recent Labs  Lab 04/25/21 2325 04/27/21 0423  WBC 12.5* 9.6  NEUTROABS 8.9*  --   HGB 15.9* 12.4  HCT 48.1* 38.3  MCV 89.7 91.4  PLT  401* 308    Basic Metabolic Panel: Recent Labs  Lab 04/25/21 2325 04/26/21 1015 04/27/21 0423  NA 139  --  140  K 3.4*  --  2.9*  CL 98  --  108  CO2 29  --  27  GLUCOSE 126*  --  85  BUN 11  --  10  CREATININE 1.01*  --  0.69  CALCIUM 9.6  --  7.9*  MG  --  2.2  --     Liver Function Tests: Recent Labs  Lab 04/25/21 2325 04/27/21 0423  AST 38 38  ALT 36 51*  ALKPHOS 78 58  BILITOT 0.7 0.8  PROT 8.9* 6.2*  ALBUMIN 5.0 3.7     Radiology Studies: CT ABDOMEN PELVIS W CONTRAST  Result Date: 04/26/2021 CLINICAL DATA:  31 year old female with nausea vomiting. Abdominal pain for 4 days. EXAM: CT ABDOMEN AND PELVIS WITH CONTRAST TECHNIQUE: Multidetector CT imaging of the abdomen and pelvis was performed using the standard protocol following bolus administration of intravenous contrast. RADIATION DOSE REDUCTION: This exam was performed according to the departmental dose-optimization program which includes automated exposure control, adjustment of the mA and/or kV according to patient size and/or use of iterative reconstruction technique. CONTRAST:  OMNIPAQUE IOHEXOL 300 MG/ML  SOLN COMPARISON:  None. FINDINGS: Lower chest: Mild lung base atelectasis but otherwise negative. No pericardial or pleural effusion. Hepatobiliary: Gallbladder Phrygian cap, normal variant. Negative liver and gallbladder. No bile duct enlargement. Pancreas: Negative. Spleen: Negative. Adrenals/Urinary Tract: Normal adrenal glands. Kidneys appears symmetric and negative. No nephrolithiasis or pararenal inflammation. Decompressed ureters. Unremarkable bladder. Deep left hemipelvis phlebolith. Stomach/Bowel: Decompressed large bowel with some oral contrast. Questionable solitary sigmoid diverticula. No large bowel inflammation. Diminutive, normal appendix on coronal image 80. Small volume oral contrast in the terminal ileum which otherwise appears negative. No dilated small bowel. Negative stomach and duodenum.  No free air, free fluid, mesenteric inflammation. Vascular/Lymphatic: Major arterial structures appear patent and normal. No lymphadenopathy identified. Grossly patent portal venous system, early portal venous phase contrast. Reproductive: Negative. Other: No pelvic free fluid. Musculoskeletal: Negative. IMPRESSION: Negative CT Abdomen and Pelvis. Electronically Signed   By: Odessa Fleming M.D.   On: 04/26/2021 08:19      LOS: 0 days    Joycelyn Das, MD [facility] 04/27/2021, 1:05 PM

## 2021-04-27 NOTE — Assessment & Plan Note (Addendum)
Improved after supportive care..  Reglan is ineffective as per the patient.  Counseled against marijuana usage.  On Phenergan at home.

## 2021-04-28 LAB — MAGNESIUM: Magnesium: 2.1 mg/dL (ref 1.7–2.4)

## 2021-04-28 LAB — BASIC METABOLIC PANEL
Anion gap: 3 — ABNORMAL LOW (ref 5–15)
BUN: 7 mg/dL (ref 6–20)
CO2: 26 mmol/L (ref 22–32)
Calcium: 7.6 mg/dL — ABNORMAL LOW (ref 8.9–10.3)
Chloride: 107 mmol/L (ref 98–111)
Creatinine, Ser: 0.62 mg/dL (ref 0.44–1.00)
GFR, Estimated: >60 mL/min (ref >60)
Glucose, Bld: 93 mg/dL (ref 70–99)
Potassium: 2.9 mmol/L — ABNORMAL LOW (ref 3.5–5.1)
Sodium: 136 mmol/L (ref 135–145)

## 2021-04-28 MED ORDER — ONDANSETRON 4 MG PO TBDP
4.0000 mg | ORAL_TABLET | Freq: Four times a day (QID) | ORAL | Status: DC | PRN
  Administered 2021-04-28 – 2021-04-29 (×2): 4 mg via ORAL
  Filled 2021-04-28 (×2): qty 1

## 2021-04-28 MED ORDER — POTASSIUM CHLORIDE 10 MEQ/100ML IV SOLN
10.0000 meq | INTRAVENOUS | Status: AC
  Administered 2021-04-28 (×6): 10 meq via INTRAVENOUS
  Filled 2021-04-28 (×6): qty 100

## 2021-04-28 MED ORDER — POTASSIUM CHLORIDE CRYS ER 20 MEQ PO TBCR
40.0000 meq | EXTENDED_RELEASE_TABLET | Freq: Once | ORAL | Status: AC
  Administered 2021-04-28: 40 meq via ORAL
  Filled 2021-04-28: qty 2

## 2021-04-28 NOTE — TOC Initial Note (Signed)
Transition of Care [facility]) - Initial/Assessment Note    Patient Details  Name: Deborah Kelly MRN: [id number] Date of Birth: 19-Feb-1991  Transition of Care [facility]) CM/SW Contact:    Joaquin Courts, RN Phone Number: 04/28/2021, 12:12 PM  Clinical Narrative:                 CM noted no PCP listed on chart review.  Met with patient at bedside and provided List on pcp providers.  Patient instructed to reach out to provider of preference to establish care.  No further TOC needs identified.   Expected Discharge Plan: Home/Self Care Barriers to Discharge: Continued Medical Work up   Patient Goals and CMS Choice Patient states their goals for this hospitalization and ongoing recovery are:: to get better      Expected Discharge Plan and Services Expected Discharge Plan: Home/Self Care   Discharge Planning Services: CM Consult   Living arrangements for the past 2 months: Apartment                                      Prior Living Arrangements/Services Living arrangements for the past 2 months: Apartment Lives with:: Self Patient language and need for interpreter reviewed:: Yes Do you feel safe going back to the place where you live?: Yes      Need for Family Participation in Patient Care: No (Comment)     Criminal Activity/Legal Involvement Pertinent to Current Situation/Hospitalization: No - Comment as needed  Activities of Daily Living Home Assistive Devices/Equipment: None ADL Screening (condition at time of admission) Patient's cognitive ability adequate to safely complete daily activities?: No Is the patient deaf or have difficulty hearing?: No Does the patient have difficulty seeing, even when wearing glasses/contacts?: No Does the patient have difficulty concentrating, remembering, or making decisions?: No Patient able to express need for assistance with ADLs?: Yes Does the patient have difficulty dressing or bathing?: No Independently performs ADLs?: Yes  (appropriate for developmental age) Does the patient have difficulty walking or climbing stairs?: No Weakness of Legs: None Weakness of Arms/Hands: None  Permission Sought/Granted                  Emotional Assessment Appearance:: Appears stated age Attitude/Demeanor/Rapport: Engaged Affect (typically observed): Accepting Orientation: : Oriented to Self, Oriented to Place, Oriented to  Time, Oriented to Situation   Psych Involvement: No (comment)  Admission diagnosis:  Gastroparesis [K31.84] Intractable nausea and vomiting [R11.2] Intractable vomiting [R11.10] Patient Active Problem List   Diagnosis Date Noted   Intractable vomiting 04/27/2021   Intractable nausea and vomiting 04/26/2021   Gastroparesis 04/26/2021   Marijuana abuse 04/26/2021   HTN (hypertension) 04/26/2021   Bipolar disorder ([place]) 04/26/2021   Hypokalemia 04/26/2021   PCP:  Merryl Hacker, No Pharmacy:   [facility] [id number], [place] [address] Phone: [phone] Fax: [phone]     Social Determinants of Health (SDOH) Interventions    Readmission Risk Interventions No flowsheet data found.

## 2021-04-28 NOTE — Plan of Care (Signed)
°  Problem: Pain Managment: Goal: General experience of comfort will improve Outcome: Progressing   Problem: Elimination: Goal: Will not experience complications related to bowel motility Outcome: Progressing   Problem: Coping: Goal: Level of anxiety will decrease Outcome: Progressing

## 2021-04-28 NOTE — Progress Notes (Signed)
PROGRESS NOTE    Deborah Kelly  [id number] DOB: 24-Nov-1990 DOA: 04/25/2021 PCP: Pcp, No    Brief Narrative:  Deborah Kelly is a 31 y.o. female with past medical history of bipolar disorder, gastroparesis, Graves' disease presented to hospital with intractable nausea,vomiting and abdominal pain.  Patient recently moved from [place] to [place] and has not established care here.  She does have a history of gastroparesis flare every few months but this is not due to diabetes.  She was unable to keep anything down at home and presented to the hospital.  She does however smoke marijuana  "socially".  Patient was then admitted to hospital for further evaluation and treatment.     Assessment and Plan: * Intractable nausea and vomiting- (present on admission) History of cyclic vomiting syndrome.  On Phenergan, Haldol and Klonopin.  Add Zofran ODT.  Reglan not effective as per the patient..  Continue PPI.  Follows up with GI in [place].  On IV Dilaudid for pain with Toradol.    Nondiabetic gastroparesis.  Had previous admissions for the same.Counseled about cutting down on the doses. on clears and wishes to be advanced on the diet.  Will advance to full liquids today.  We will gradually advance diet.  Gastroparesis Continue supportive care.  Reglan is ineffective as per the patient.  Counseled against marijuana usage.  On Phenergan at this time  Hypokalemia Still with significant hypokalemia.  We will replace with IV KCl.  Add oral potassium chloride as well.  Check levels in a.m.  Continue IV fluids.  Bipolar disorder ([place]) On Haldol IM at this time.  Have resumed Lamictal Seroquel and Klonopin.  HTN (hypertension) On atenolol chlorthalidone at home.  Currently on IV metoprolol.  Continue to hold her chlorthalidone due to significant hypokalemia.  Might not be a good choice on discharge.  Marijuana abuse- (present on admission) Could be contributing to cyclic vomiting.     DVT  prophylaxis: SCDs Start: 04/26/21 1622   Code Status:     Code Status: Full Code  Disposition: Home likely in 1 to 2 days  Status is: Inpatient  The patient wis inpatient because: IV fluids, antiemetics, gastroparesis, poor oral intolerance    Family Communication:  Spoke with the patient at bedside  Consultants:  None  Procedures:  None  Antimicrobials:  None  Anti-infectives (From admission, onward)    None       Subjective: Today, patient was seen and examined at bedside.  Patient complains of abdominal pain but no nausea or vomiting over the last 24 hours was able to tolerate clears.  Wishes to be advanced on full liquids today.  Has not had a bowel movement.    Objective: Vitals:   04/27/21 0932 04/27/21 1309 04/27/21 2155 04/28/21 0557  BP: 119/70 (!) 145/97 (!) 156/68 (!) 145/73  Pulse: 70 (!) 54 80 73  Resp: 18 18 18 18   Temp: 97.7 F (36.5 C)  99 F (37.2 C) 97.7 F (36.5 C)  TempSrc: Oral  Oral Oral  SpO2: 100% 99% 98% 100%  Weight:      Height:        Intake/Output Summary (Last 24 hours) at 04/28/2021 1130 Last data filed at 04/28/2021 0600 Gross per 24 hour  Intake 2669.94 ml  Output --  Net 2669.94 ml   Filed Weights   04/26/21 1618  Weight: (!) 147.2 kg   Body mass index is 49.34 kg/m.   Physical Examination:  General: Morbidly obese built, not  in obvious distress HENT:   No scleral pallor or icterus noted. Oral mucosa is moist.  Chest:  Clear breath sounds.  Diminished breath sounds bilaterally. No crackles or wheezes.  CVS: S1 &S2 heard. No murmur.  Regular rate and rhythm. Abdomen: Soft, nonspecific epigastric tenderness noted, nondistended.  Bowel sounds are heard.   Extremities: No cyanosis, clubbing or edema.  Peripheral pulses are palpable. Psych: Alert, awake and oriented, normal mood CNS:  No cranial nerve deficits.  Power equal in all extremities.   Skin: Warm and dry.  No rashes noted.   Data Reviewed:    CBC: Recent Labs  Lab 04/25/21 2325 04/27/21 0423  WBC 12.5* 9.6  NEUTROABS 8.9*  --   HGB 15.9* 12.4  HCT 48.1* 38.3  MCV 89.7 91.4  PLT 401* [id number]    Basic Metabolic Panel: Recent Labs  Lab 04/25/21 2325 04/26/21 1015 04/27/21 0423 04/28/21 0723  NA 139  --  140 136  K 3.4*  --  2.9* 2.9*  CL 98  --  108 107  CO2 29  --  27 26  GLUCOSE 126*  --  85 93  BUN 11  --  10 7  CREATININE 1.01*  --  0.69 0.62  CALCIUM 9.6  --  7.9* 7.6*  MG  --  2.2  --  2.1    Liver Function Tests: Recent Labs  Lab 04/25/21 2325 04/27/21 0423  AST 38 38  ALT 36 51*  ALKPHOS 78 58  BILITOT 0.7 0.8  PROT 8.9* 6.2*  ALBUMIN 5.0 3.7     Radiology Studies: No results found.    LOS: 1 day    Flora Lipps, MD [facility] 04/28/2021, 11:30 AM

## 2021-04-29 DIAGNOSIS — R1110 Vomiting, unspecified: Secondary | ICD-10-CM

## 2021-04-29 LAB — COMPREHENSIVE METABOLIC PANEL
ALT: 28 U/L (ref 0–44)
AST: 17 U/L (ref 15–41)
Albumin: 2.9 g/dL — ABNORMAL LOW (ref 3.5–5.0)
Alkaline Phosphatase: 51 U/L (ref 38–126)
Anion gap: 5 (ref 5–15)
BUN: 6 mg/dL (ref 6–20)
CO2: 24 mmol/L (ref 22–32)
Calcium: 7.8 mg/dL — ABNORMAL LOW (ref 8.9–10.3)
Chloride: 110 mmol/L (ref 98–111)
Creatinine, Ser: 0.7 mg/dL (ref 0.44–1.00)
GFR, Estimated: >60 mL/min (ref >60)
Glucose, Bld: 103 mg/dL — ABNORMAL HIGH (ref 70–99)
Potassium: 3.7 mmol/L (ref 3.5–5.1)
Sodium: 139 mmol/L (ref 135–145)
Total Bilirubin: 0.5 mg/dL (ref 0.3–1.2)
Total Protein: 5.2 g/dL — ABNORMAL LOW (ref 6.5–8.1)

## 2021-04-29 LAB — CBC
HCT: 35 % — ABNORMAL LOW (ref 36.0–46.0)
Hemoglobin: 12 g/dL (ref 12.0–15.0)
MCH: 30.5 pg (ref 26.0–34.0)
MCHC: 34.3 g/dL (ref 30.0–36.0)
MCV: 89.1 fL (ref 80.0–100.0)
Platelets: 303 10*3/uL (ref 150–400)
RBC: 3.93 MIL/uL (ref 3.87–5.11)
RDW: 12.8 % (ref 11.5–15.5)
WBC: 7.4 10*3/uL (ref 4.0–10.5)
nRBC: 0 % (ref 0.0–0.2)

## 2021-04-29 LAB — MAGNESIUM: Magnesium: 1.9 mg/dL (ref 1.7–2.4)

## 2021-04-29 MED ORDER — HALOPERIDOL 5 MG PO TABS: 10.0000 mg | ORAL_TABLET | Freq: Every evening | ORAL | 2 refills | Status: DC

## 2021-04-29 MED ORDER — PROMETHAZINE HCL 12.5 MG PO TABS: 25.0000 mg | ORAL_TABLET | Freq: Four times a day (QID) | ORAL | 0 refills | Status: DC | PRN

## 2021-04-29 MED ORDER — POTASSIUM CHLORIDE CRYS ER 20 MEQ PO TBCR: 20.0000 meq | EXTENDED_RELEASE_TABLET | Freq: Every day | ORAL | 0 refills | Status: DC

## 2021-04-29 MED ORDER — PANTOPRAZOLE SODIUM 40 MG PO TBEC: 40.0000 mg | DELAYED_RELEASE_TABLET | Freq: Every day | ORAL | 1 refills | Status: AC

## 2021-04-29 NOTE — Discharge Summary (Signed)
Physician Discharge Summary   Patient: Deborah Kelly MRN: [id number] DOB: 01/22/1991  Admit date:     04/25/2021  Discharge date: 04/29/21  Discharge Physician: Rebekah Chesterfield Sennie Borden   PCP: Pcp, No   Recommendations at discharge:   Follow-up with your primary care physician in 1 week.    Discharge Diagnoses: Active Problems:   Gastroparesis   Hypokalemia   Marijuana abuse   HTN (hypertension)   Bipolar disorder (HCC)   Intractable vomiting  Principal Problem (Resolved):   Intractable nausea and vomiting   Hospital Course: Deborah Kelly is a 31 y.o. female with past medical history of bipolar disorder, gastroparesis, Graves' disease presented to hospital with intractable nausea,vomiting and abdominal pain.  Patient recently moved from [place] to [place] and has not established care here.  She does have a history of gastroparesis flare every few months but this is not due to diabetes.  She was unable to keep anything down at home and presented to the hospital.  She does however smoke marijuana  "socially".  Patient was then admitted to hospital for further evaluation and treatment.   Assessment and Plan: * Intractable nausea and vomiting-resolved as of 04/29/2021, (present on admission) History of cyclic vomiting syndrome.  Patient received Phenergan, Haldol and Klonopin, Zofran ODT.  Reglan not effective as per the patient..  Continue PPI on discharge..  Follows up with GI in [place] but has recently moved to this area..  Also received IV Dilaudid for pain with Toradol.    Nondiabetic gastroparesis.  Had previous admissions for the same.Counseled about dietary modification including low fiber, low-fat small frequent meals.  At this time patient has tolerated oral diet and wishes to go home.  Advised soft/full liquids and advance as tolerated.  Gastroparesis Improved after supportive care..  Reglan is ineffective as per the patient.  Counseled against marijuana usage.  On Phenergan at  home.  Hypokalemia Received significant IV supplementation during hospitalization.  Will be given oral potassium on discharge as well.  Bipolar disorder (HCC) Continue Haldol,Lamictal Seroquel and Klonopin on discharge.  Prescription for Haldol given.  HTN (hypertension) On atenolol chlorthalidone at home.  Will be given potassium supplementation as well.  Marijuana abuse- (present on admission) Could be contributing to cyclic vomiting.  Counseling was done    Consultants: Full code Procedures performed: None Disposition: Home Diet recommendation:  Discharge Diet Orders (From admission, onward)     Start     Ordered   04/29/21 0000  Diet - low sodium heart healthy        04/29/21 0845           Full liquid diet  DISCHARGE MEDICATION: Allergies as of 04/29/2021       Reactions   Amlodipine Hives   Dicyclomine Other (See Comments)   Pt states it makes her hands shake; pt also states a rash.    Penicillins         Medication List     TAKE these medications    acetaminophen-codeine 300-60 MG tablet Commonly known as: TYLENOL #4 Take 1 tablet by mouth daily as needed for pain.   atenolol-chlorthalidone 100-25 MG tablet Commonly known as: TENORETIC Take 1 tablet by mouth every evening.   clonazePAM 1 MG tablet Commonly known as: KLONOPIN Take 1 mg by mouth 2 (two) times daily as needed for anxiety.   haloperidol 5 MG tablet Commonly known as: HALDOL Take 2 tablets (10 mg total) by mouth at bedtime.   lamoTRIgine 200 MG tablet Commonly  known as: LAMICTAL Take 200 mg by mouth daily at 12 noon.   omeprazole 20 MG tablet Commonly known as: PRILOSEC OTC Take 1 tablet by mouth daily as needed (for acid reflux).   pantoprazole 40 MG tablet Commonly known as: Protonix Take 1 tablet (40 mg total) by mouth daily.   potassium chloride SA 20 MEQ tablet Commonly known as: KLOR-CON M Take 1 tablet (20 mEq total) by mouth daily for 10 days.   promethazine  12.5 MG tablet Commonly known as: PHENERGAN Take 2 tablets (25 mg total) by mouth every 6 (six) hours as needed for nausea or vomiting.   QUEtiapine 300 MG tablet Commonly known as: SEROQUEL Take 300 mg by mouth at bedtime.        Follow-up Information     Primary care provider. Schedule an appointment as soon as possible for a visit in 1 week(s).   Why: blood work, regular followup               Subjective Today, patient was seen and examined at bedside.  Has been able to tolerate oral diet.  No nausea vomiting.  Wishes to go home.  Discharge Exam: Filed Weights   04/26/21 1618  Weight: (!) 147.2 kg   General: Morbidly obese not in obvious distress HENT:   No scleral pallor or icterus noted. Oral mucosa is moist.  Chest:  Clear breath sounds.  Diminished breath sounds bilaterally. No crackles or wheezes.  CVS: S1 &S2 heard. No murmur.  Regular rate and rhythm. Abdomen: Soft, nontender, nondistended.  Bowel sounds are heard.   Extremities: No cyanosis, clubbing or edema.  Peripheral pulses are palpable. Psych: Alert, awake and oriented, normal mood CNS:  No cranial nerve deficits.  Power equal in all extremities.   Skin: Warm and dry.  No rashes noted.   Condition at discharge: good  The results of significant diagnostics from this hospitalization (including imaging, microbiology, ancillary and laboratory) are listed below for reference.   Imaging Studies: CT ABDOMEN PELVIS W CONTRAST  Result Date: 04/26/2021 CLINICAL DATA:  31 year old female with nausea vomiting. Abdominal pain for 4 days. EXAM: CT ABDOMEN AND PELVIS WITH CONTRAST TECHNIQUE: Multidetector CT imaging of the abdomen and pelvis was performed using the standard protocol following bolus administration of intravenous contrast. RADIATION DOSE REDUCTION: This exam was performed according to the departmental dose-optimization program which includes automated exposure control, adjustment of the mA and/or kV  according to patient size and/or use of iterative reconstruction technique. CONTRAST:  100mL OMNIPAQUE IOHEXOL 300 MG/ML  SOLN COMPARISON:  None. FINDINGS: Lower chest: Mild lung base atelectasis but otherwise negative. No pericardial or pleural effusion. Hepatobiliary: Gallbladder Phrygian cap, normal variant. Negative liver and gallbladder. No bile duct enlargement. Pancreas: Negative. Spleen: Negative. Adrenals/Urinary Tract: Normal adrenal glands. Kidneys appears symmetric and negative. No nephrolithiasis or pararenal inflammation. Decompressed ureters. Unremarkable bladder. Deep left hemipelvis phlebolith. Stomach/Bowel: Decompressed large bowel with some oral contrast. Questionable solitary sigmoid diverticula. No large bowel inflammation. Diminutive, normal appendix on coronal image 80. Small volume oral contrast in the terminal ileum which otherwise appears negative. No dilated small bowel. Negative stomach and duodenum. No free air, free fluid, mesenteric inflammation. Vascular/Lymphatic: Major arterial structures appear patent and normal. No lymphadenopathy identified. Grossly patent portal venous system, early portal venous phase contrast. Reproductive: Negative. Other: No pelvic free fluid. Musculoskeletal: Negative. IMPRESSION: Negative CT Abdomen and Pelvis. Electronically Signed   By: Odessa FlemingH  Hall M.D.   On: 04/26/2021 08:19  Microbiology: Results for orders placed or performed during the hospital encounter of 04/25/21  Resp Panel by RT-PCR (Flu A&B, Covid) Nasopharyngeal Swab     Status: None   Collection Time: 04/26/21 10:14 AM   Specimen: Nasopharyngeal Swab; Nasopharyngeal(NP) swabs in vial transport medium  Result Value Ref Range Status   SARS Coronavirus 2 by RT PCR NEGATIVE NEGATIVE Final    Comment: (NOTE) SARS-CoV-2 target nucleic acids are NOT DETECTED.  The SARS-CoV-2 RNA is generally detectable in upper respiratory specimens during the acute phase of infection. The  lowest concentration of SARS-CoV-2 viral copies this assay can detect is 138 copies/mL. A negative result does not preclude SARS-Cov-2 infection and should not be used as the sole basis for treatment or other patient management decisions. A negative result may occur with  improper specimen collection/handling, submission of specimen other than nasopharyngeal swab, presence of viral mutation(s) within the areas targeted by this assay, and inadequate number of viral copies(<138 copies/mL). A negative result must be combined with clinical observations, patient history, and epidemiological information. The expected result is Negative.  Fact Sheet for Patients:  BloggerCourse.com  Fact Sheet for Healthcare Providers:  SeriousBroker.it  This test is no t yet approved or cleared by the [place] FDA and  has been authorized for detection and/or diagnosis of SARS-CoV-2 by FDA under an Emergency Use Authorization (EUA). This EUA will remain  in effect (meaning this test can be used) for the duration of the COVID-19 declaration under Section 564(b)(1) of the Act, 21 U.S.C.section 360bbb-3(b)(1), unless the authorization is terminated  or revoked sooner.       Influenza A by PCR NEGATIVE NEGATIVE Final   Influenza B by PCR NEGATIVE NEGATIVE Final    Comment: (NOTE) The Xpert Xpress SARS-CoV-2/FLU/RSV plus assay is intended as an aid in the diagnosis of influenza from Nasopharyngeal swab specimens and should not be used as a sole basis for treatment. Nasal washings and aspirates are unacceptable for Xpert Xpress SARS-CoV-2/FLU/RSV testing.  Fact Sheet for Patients: BloggerCourse.com  Fact Sheet for Healthcare Providers: SeriousBroker.it  This test is not yet approved or cleared by the [place] FDA and has been authorized for detection and/or diagnosis of SARS-CoV-2 by FDA under  an Emergency Use Authorization (EUA). This EUA will remain in effect (meaning this test can be used) for the duration of the COVID-19 declaration under Section 564(b)(1) of the Act, 21 U.S.C. section 360bbb-3(b)(1), unless the authorization is terminated or revoked.  Performed at [facility], [address] [place], [place] [address]     Labs: CBC: Recent Labs  Lab 04/25/21 2325 04/27/21 0423 04/29/21 0446  WBC 12.5* 9.6 7.4  NEUTROABS 8.9*  --   --   HGB 15.9* 12.4 12.0  HCT 48.1* 38.3 35.0*  MCV 89.7 91.4 89.1  PLT 401* 308 303   Basic Metabolic Panel: Recent Labs  Lab 04/25/21 2325 04/26/21 1015 04/27/21 0423 04/28/21 0723 04/29/21 0446  NA 139  --  140 136 139  K 3.4*  --  2.9* 2.9* 3.7  CL 98  --  108 107 110  CO2 29  --  27 26 24   GLUCOSE 126*  --  85 93 103*  BUN 11  --  10 7 6   CREATININE 1.01*  --  0.69 0.62 0.70  CALCIUM 9.6  --  7.9* 7.6* 7.8*  MG  --  2.2  --  2.1 1.9   Liver Function Tests: Recent Labs  Lab 04/25/21  2325 04/27/21 0423 04/29/21 0446  AST 38 38 17  ALT 36 51* 28  ALKPHOS 78 58 51  BILITOT 0.7 0.8 0.5  PROT 8.9* 6.2* 5.2*  ALBUMIN 5.0 3.7 2.9*   CBG: No results for input(s): GLUCAP in the last 168 hours.  Discharge time spent: greater than 30 minutes.  Signed: Joycelyn Das, MD [facility] 04/29/2021

## 2021-07-14 ENCOUNTER — Inpatient Hospital Stay (HOSPITAL_COMMUNITY)
Admission: EM | Admit: 2021-07-14 | Discharge: 2021-07-22 | DRG: 392 | Disposition: A | Discharge status: Home / Self Care | Payer: No Typology Code available for payment source | Attending: Internal Medicine | Admitting: Internal Medicine

## 2021-07-14 ENCOUNTER — Encounter (HOSPITAL_COMMUNITY): Payer: Self-pay | Admitting: Emergency Medicine

## 2021-07-14 ENCOUNTER — Other Ambulatory Visit: Payer: Self-pay

## 2021-07-14 DIAGNOSIS — R1110 Vomiting, unspecified: Secondary | ICD-10-CM | POA: Diagnosis present

## 2021-07-14 DIAGNOSIS — E876 Hypokalemia: Secondary | ICD-10-CM | POA: Diagnosis present

## 2021-07-14 DIAGNOSIS — Z880 Allergy status to penicillin: Secondary | ICD-10-CM

## 2021-07-14 DIAGNOSIS — G8929 Other chronic pain: Secondary | ICD-10-CM | POA: Diagnosis present

## 2021-07-14 DIAGNOSIS — E66813 Obesity, class 3: Secondary | ICD-10-CM | POA: Diagnosis present

## 2021-07-14 DIAGNOSIS — Z79899 Other long term (current) drug therapy: Secondary | ICD-10-CM

## 2021-07-14 DIAGNOSIS — R109 Unspecified abdominal pain: Secondary | ICD-10-CM | POA: Diagnosis present

## 2021-07-14 DIAGNOSIS — E0500 Thyrotoxicosis with diffuse goiter without thyrotoxic crisis or storm: Secondary | ICD-10-CM | POA: Diagnosis present

## 2021-07-14 DIAGNOSIS — K3184 Gastroparesis: Principal | ICD-10-CM | POA: Diagnosis present

## 2021-07-14 DIAGNOSIS — F1290 Cannabis use, unspecified, uncomplicated: Secondary | ICD-10-CM | POA: Diagnosis present

## 2021-07-14 DIAGNOSIS — F319 Bipolar disorder, unspecified: Secondary | ICD-10-CM | POA: Diagnosis present

## 2021-07-14 DIAGNOSIS — R112 Nausea with vomiting, unspecified: Secondary | ICD-10-CM | POA: Diagnosis present

## 2021-07-14 DIAGNOSIS — F419 Anxiety disorder, unspecified: Secondary | ICD-10-CM | POA: Diagnosis present

## 2021-07-14 DIAGNOSIS — K56609 Unspecified intestinal obstruction, unspecified as to partial versus complete obstruction: Secondary | ICD-10-CM

## 2021-07-14 DIAGNOSIS — Z6841 Body Mass Index (BMI) 40.0 and over, adult: Secondary | ICD-10-CM

## 2021-07-14 DIAGNOSIS — Z888 Allergy status to other drugs, medicaments and biological substances status: Secondary | ICD-10-CM

## 2021-07-14 DIAGNOSIS — I10 Essential (primary) hypertension: Secondary | ICD-10-CM | POA: Diagnosis present

## 2021-07-14 DIAGNOSIS — R591 Generalized enlarged lymph nodes: Secondary | ICD-10-CM | POA: Diagnosis present

## 2021-07-14 DIAGNOSIS — R9431 Abnormal electrocardiogram [ECG] [EKG]: Secondary | ICD-10-CM | POA: Diagnosis present

## 2021-07-14 DIAGNOSIS — Z87891 Personal history of nicotine dependence: Secondary | ICD-10-CM

## 2021-07-14 NOTE — ED Triage Notes (Signed)
Pt has hx of gastroparesis and has been having abdominal pain and vomiting for 4 days. Pt reports pain around her navel. ?

## 2021-07-15 ENCOUNTER — Encounter (HOSPITAL_COMMUNITY): Payer: Self-pay | Admitting: Emergency Medicine

## 2021-07-15 DIAGNOSIS — R109 Unspecified abdominal pain: Secondary | ICD-10-CM | POA: Diagnosis present

## 2021-07-15 DIAGNOSIS — K3184 Gastroparesis: Secondary | ICD-10-CM | POA: Diagnosis not present

## 2021-07-15 DIAGNOSIS — R1013 Epigastric pain: Secondary | ICD-10-CM

## 2021-07-15 DIAGNOSIS — E876 Hypokalemia: Secondary | ICD-10-CM

## 2021-07-15 DIAGNOSIS — F319 Bipolar disorder, unspecified: Secondary | ICD-10-CM | POA: Diagnosis not present

## 2021-07-15 LAB — COMPREHENSIVE METABOLIC PANEL
ALT: 20 U/L (ref 0–44)
AST: 32 U/L (ref 15–41)
Albumin: 4.6 g/dL (ref 3.5–5.0)
Alkaline Phosphatase: 70 U/L (ref 38–126)
Anion gap: 10 (ref 5–15)
BUN: 15 mg/dL (ref 6–20)
CO2: 26 mmol/L (ref 22–32)
Calcium: 8.9 mg/dL (ref 8.9–10.3)
Chloride: 106 mmol/L (ref 98–111)
Creatinine, Ser: 1.04 mg/dL — ABNORMAL HIGH (ref 0.44–1.00)
GFR, Estimated: >60 mL/min (ref >60)
Glucose, Bld: 135 mg/dL — ABNORMAL HIGH (ref 70–99)
Potassium: 3.5 mmol/L (ref 3.5–5.1)
Sodium: 142 mmol/L (ref 135–145)
Total Bilirubin: 1.1 mg/dL (ref 0.3–1.2)
Total Protein: 8.2 g/dL — ABNORMAL HIGH (ref 6.5–8.1)

## 2021-07-15 LAB — CBC
HCT: 41.5 % (ref 36.0–46.0)
HCT: 43.5 % (ref 36.0–46.0)
Hemoglobin: 13.3 g/dL (ref 12.0–15.0)
Hemoglobin: 14.6 g/dL (ref 12.0–15.0)
MCH: 29.5 pg (ref 26.0–34.0)
MCH: 30.2 pg (ref 26.0–34.0)
MCHC: 32 g/dL (ref 30.0–36.0)
MCHC: 33.6 g/dL (ref 30.0–36.0)
MCV: 89.9 fL (ref 80.0–100.0)
MCV: 92 fL (ref 80.0–100.0)
Platelets: 373 10*3/uL (ref 150–400)
Platelets: 404 10*3/uL — ABNORMAL HIGH (ref 150–400)
RBC: 4.51 MIL/uL (ref 3.87–5.11)
RBC: 4.84 MIL/uL (ref 3.87–5.11)
RDW: 14.2 % (ref 11.5–15.5)
RDW: 14.2 % (ref 11.5–15.5)
WBC: 13.4 10*3/uL — ABNORMAL HIGH (ref 4.0–10.5)
WBC: 16.2 10*3/uL — ABNORMAL HIGH (ref 4.0–10.5)
nRBC: 0 % (ref 0.0–0.2)
nRBC: 0 % (ref 0.0–0.2)

## 2021-07-15 LAB — BASIC METABOLIC PANEL
Anion gap: 10 (ref 5–15)
BUN: 15 mg/dL (ref 6–20)
CO2: 29 mmol/L (ref 22–32)
Calcium: 8.7 mg/dL — ABNORMAL LOW (ref 8.9–10.3)
Chloride: 106 mmol/L (ref 98–111)
Creatinine, Ser: 0.9 mg/dL (ref 0.44–1.00)
GFR, Estimated: >60 mL/min (ref >60)
Glucose, Bld: 123 mg/dL — ABNORMAL HIGH (ref 70–99)
Potassium: 3.3 mmol/L — ABNORMAL LOW (ref 3.5–5.1)
Sodium: 145 mmol/L (ref 135–145)

## 2021-07-15 LAB — URINALYSIS, ROUTINE W REFLEX MICROSCOPIC
Bilirubin Urine: NEGATIVE
Glucose, UA: NEGATIVE mg/dL
Hgb urine dipstick: NEGATIVE
Ketones, ur: 5 mg/dL — AB
Nitrite: NEGATIVE
Protein, ur: 30 mg/dL — AB
Specific Gravity, Urine: 1.033 — ABNORMAL HIGH (ref 1.005–1.030)
pH: 6 (ref 5.0–8.0)

## 2021-07-15 LAB — MAGNESIUM: Magnesium: 2.5 mg/dL — ABNORMAL HIGH (ref 1.7–2.4)

## 2021-07-15 LAB — RAPID URINE DRUG SCREEN, HOSP PERFORMED
Amphetamines: NOT DETECTED
Barbiturates: NOT DETECTED
Benzodiazepines: POSITIVE — AB
Cocaine: NOT DETECTED
Opiates: POSITIVE — AB
Tetrahydrocannabinol: POSITIVE — AB

## 2021-07-15 LAB — I-STAT BETA HCG BLOOD, ED (MC, WL, AP ONLY): I-stat hCG, quantitative: <5 m[IU]/mL (ref <5)

## 2021-07-15 LAB — LIPASE, BLOOD: Lipase: 27 U/L (ref 11–51)

## 2021-07-15 MED ORDER — METOPROLOL TARTRATE 5 MG/5ML IV SOLN: 5.0000 mg | Freq: Four times a day (QID) | INTRAVENOUS | Status: DC

## 2021-07-15 MED ORDER — SODIUM CHLORIDE 0.9 % IV SOLN
25.0000 mg | Freq: Four times a day (QID) | INTRAVENOUS | Status: DC | PRN
  Administered 2021-07-15 – 2021-07-21 (×13): 25 mg via INTRAVENOUS
  Filled 2021-07-15: qty 25
  Filled 2021-07-15: qty 1
  Filled 2021-07-15: qty 25
  Filled 2021-07-15: qty 1
  Filled 2021-07-15 (×4): qty 25
  Filled 2021-07-15: qty 1
  Filled 2021-07-15 (×4): qty 25
  Filled 2021-07-15: qty 1
  Filled 2021-07-15: qty 25

## 2021-07-15 MED ORDER — CLONAZEPAM 1 MG PO TBDP
1.0000 mg | ORAL_TABLET | Freq: Once | ORAL | Status: AC
  Administered 2021-07-15: 1 mg via ORAL
  Filled 2021-07-15: qty 1

## 2021-07-15 MED ORDER — POTASSIUM CHLORIDE IN NACL 20-0.9 MEQ/L-% IV SOLN
INTRAVENOUS | Status: DC
  Filled 2021-07-15 (×26): qty 1000

## 2021-07-15 MED ORDER — PANTOPRAZOLE SODIUM 40 MG IV SOLR
40.0000 mg | INTRAVENOUS | Status: DC
  Administered 2021-07-16 – 2021-07-22 (×7): 40 mg via INTRAVENOUS
  Filled 2021-07-15 (×7): qty 10

## 2021-07-15 MED ORDER — CLONAZEPAM 1 MG PO TABS
1.0000 mg | ORAL_TABLET | Freq: Once | ORAL | Status: AC
  Administered 2021-07-15: 1 mg via ORAL
  Filled 2021-07-15: qty 1

## 2021-07-15 MED ORDER — POTASSIUM CHLORIDE IN NACL 40-0.9 MEQ/L-% IV SOLN
INTRAVENOUS | Status: AC
  Filled 2021-07-15 (×2): qty 1000

## 2021-07-15 MED ORDER — QUETIAPINE FUMARATE 200 MG PO TABS
400.0000 mg | ORAL_TABLET | Freq: Every day | ORAL | Status: DC
  Administered 2021-07-15 – 2021-07-21 (×7): 400 mg via ORAL
  Filled 2021-07-15 (×8): qty 2

## 2021-07-15 MED ORDER — METOCLOPRAMIDE HCL 5 MG/ML IJ SOLN
10.0000 mg | Freq: Four times a day (QID) | INTRAMUSCULAR | Status: DC
  Administered 2021-07-15 – 2021-07-17 (×9): 10 mg via INTRAVENOUS
  Filled 2021-07-15 (×10): qty 2

## 2021-07-15 MED ORDER — LACTATED RINGERS IV BOLUS
1000.0000 mL | Freq: Once | INTRAVENOUS | Status: AC
Start: 2021-07-15 — End: 2021-07-15
  Administered 2021-07-15: 1000 mL via INTRAVENOUS

## 2021-07-15 MED ORDER — MILK AND MOLASSES ENEMA
1.0000 | Freq: Once | RECTAL | Status: DC | PRN
  Filled 2021-07-15: qty 240

## 2021-07-15 MED ORDER — PROCHLORPERAZINE EDISYLATE 10 MG/2ML IJ SOLN
10.0000 mg | Freq: Once | INTRAMUSCULAR | Status: AC
  Administered 2021-07-15: 10 mg via INTRAVENOUS
  Filled 2021-07-15: qty 2

## 2021-07-15 MED ORDER — HALOPERIDOL LACTATE 5 MG/ML IJ SOLN
5.0000 mg | Freq: Once | INTRAMUSCULAR | Status: AC
Start: 2021-07-15 — End: 2021-07-15
  Administered 2021-07-15: 5 mg via INTRAVENOUS
  Filled 2021-07-15: qty 1

## 2021-07-15 MED ORDER — HYDROMORPHONE HCL 1 MG/ML IJ SOLN
1.0000 mg | INTRAMUSCULAR | Status: DC | PRN
  Administered 2021-07-15 – 2021-07-22 (×44): 1 mg via INTRAVENOUS
  Filled 2021-07-15 (×46): qty 1

## 2021-07-15 MED ORDER — LACTATED RINGERS IV BOLUS
1000.0000 mL | Freq: Once | INTRAVENOUS | Status: AC
  Administered 2021-07-15: 1000 mL via INTRAVENOUS

## 2021-07-15 MED ORDER — ACETAMINOPHEN 325 MG PO TABS
650.0000 mg | ORAL_TABLET | Freq: Four times a day (QID) | ORAL | Status: DC | PRN
  Administered 2021-07-19: 650 mg via ORAL
  Filled 2021-07-15 (×2): qty 2

## 2021-07-15 MED ORDER — BISACODYL 10 MG RE SUPP: 10.0000 mg | Freq: Every day | RECTAL | Status: DC | PRN

## 2021-07-15 MED ORDER — CLONAZEPAM 1 MG PO TABS
1.0000 mg | ORAL_TABLET | Freq: Two times a day (BID) | ORAL | Status: DC | PRN
  Administered 2021-07-16 – 2021-07-21 (×12): 1 mg via ORAL
  Filled 2021-07-15 (×13): qty 1

## 2021-07-15 MED ORDER — PANTOPRAZOLE SODIUM 40 MG IV SOLR
40.0000 mg | INTRAVENOUS | Status: DC
Start: 2021-07-15 — End: 2021-07-15

## 2021-07-15 MED ORDER — HYDROMORPHONE HCL 1 MG/ML IJ SOLN
1.0000 mg | Freq: Once | INTRAMUSCULAR | Status: AC
  Administered 2021-07-15: 1 mg via INTRAVENOUS
  Filled 2021-07-15: qty 1

## 2021-07-15 MED ORDER — ACETAMINOPHEN 650 MG RE SUPP: 650.0000 mg | Freq: Four times a day (QID) | RECTAL | Status: DC | PRN

## 2021-07-15 MED ORDER — HALOPERIDOL 2 MG PO TABS
10.0000 mg | ORAL_TABLET | Freq: Every day | ORAL | Status: DC
Start: 2021-07-15 — End: 2021-07-22
  Administered 2021-07-15 – 2021-07-21 (×7): 10 mg via ORAL
  Filled 2021-07-15 (×2): qty 5
  Filled 2021-07-15: qty 2
  Filled 2021-07-15: qty 5
  Filled 2021-07-15: qty 2
  Filled 2021-07-15 (×4): qty 5

## 2021-07-15 MED ORDER — CLONAZEPAM 0.125 MG PO TBDP
1.0000 mg | ORAL_TABLET | Freq: Once | ORAL | Status: DC
  Filled 2021-07-15: qty 8

## 2021-07-15 NOTE — H&P (Signed)
?History and Physical  ? ? ?Patient: Deborah Kelly OZH:[id number] DOB: May 16, 1990 ?DOA: 07/14/2021 ?DOS: the patient was seen and examined on 07/15/2021 ?PCP: Pcp, No  ?Patient coming from: Home ? ?Chief Complaint:  ?Chief Complaint  ?Patient presents with  ? Abdominal Pain  ? ?HPI: Deborah Kelly is a 31 y.o. female with medical history significant of alcohol abuse, cannabis misuse, anxiety, depression, bipolar 1 disorder, history of suicide attempt, history of chronic pelvic pain in female, dysmenorrhea, elevated prolactin level, laparoscopic with salpingectomy, Graves' disease/hyperthyroidism, gastroparesis with history of intractable abdominal pain and vomiting, class III obesity with a BMI of 48.94 kg/m? who is coming to the emergency department with complaints of periumbilical abdominal pain associated with nausea and multiple episodes of emesis for the past 4 days.  She has also been constipated for the past few days.  No hematemesis or bilious vomiting.  No melena, hematochezia, flank pain, dysuria, frequency or hematuria.  No fever, chills, but has been having a frontal headache.  No sore throat, rhinorrhea, dyspnea, wheezing or hemoptysis.  No chest pain, palpitations, diaphoresis, PND, orthopnea or recent pitting edema lower extremities. ? ?ED course: Initial vital signs were temperature 98 ?F, pulse 57, respirations 16, BP 144/82 mmHg O2 sat 99% on room air.  The patient received 2000 mL of LR bolus hydromorphone 1 mg IVP x3, clonazepam 1 mg p.o. x1 and prochlorperazine 10 mg IVP. ? ?Lab work: Her CBC showed a white count of 13.4, hemoglobin 14.6 g/dL platelets [id number].  Lipase is normal.  CMP showing normal electrolytes, glucose of 135 and creatinine 1.04 mg/dL.  LFTs were normal except for a total protein of 8.2 g/dL.  Lipase and i-STAT hCG were normal. ? ?Review of Systems: As mentioned in the history of present illness. All other systems reviewed and are negative. ? ?Past Medical History:  ?Diagnosis Date  ?  Alcohol abuse 05/19/2017  ? Anxiety   ? Bipolar 1 disorder (HCC)   ? Bipolar affective disorder, depressed (HCC) 01/20/2012  ? Cannabis misuse 03/15/2021  ? Chronic pelvic pain in female 07/08/2020  ? Depression   ? Dysmenorrhea 07/08/2020  ? Elevated prolactin level 03/15/2021  ? Gastroparesis   ? Graves disease   ? History of suicide attempt 05/18/2017  ? Hyperthyroidism   ? Intractable vomiting 04/27/2021  ? ?History reviewed. No pertinent surgical history. ?Social History:  reports that she has quit smoking. Her smoking use included cigarettes. She has never used smokeless tobacco. She reports that she does not currently use alcohol. She reports current drug use. Drug: Marijuana. ? ?Allergies  ?Allergen Reactions  ? Amlodipine Hives  ? Dicyclomine Other (See Comments)  ?  Pt states it makes her hands shake; pt also states a rash.   ? Penicillins   ? ? ?History reviewed. No pertinent family history. ? ?Prior to Admission medications   ?Medication Sig Start Date End Date Taking? Authorizing Provider  ?acetaminophen-codeine (TYLENOL #4) 300-60 MG tablet Take 1 tablet by mouth daily as needed for pain.    [provider]  ?atenolol-chlorthalidone (TENORETIC) 100-25 MG tablet Take 1 tablet by mouth every evening. 09/16/20   [provider]  ?clonazePAM (KLONOPIN) 1 MG tablet Take 1 mg by mouth 2 (two) times daily as needed for anxiety. 04/17/21   [provider]  ?haloperidol (HALDOL) 5 MG tablet Take 2 tablets (10 mg total) by mouth at bedtime. 04/29/21   Pokhrel, Rebekah ChesterfieldLaxman, MD  ?lamoTRIgine (LAMICTAL) 200 MG tablet Take 200 mg by mouth daily  at 12 noon. 02/11/21   [provider]  ?omeprazole (PRILOSEC OTC) 20 MG tablet Take 1 tablet by mouth daily as needed (for acid reflux). 08/30/20   [provider]  ?pantoprazole (PROTONIX) 40 MG tablet Take 1 tablet (40 mg total) by mouth daily. 04/29/21 04/29/22  Pokhrel, Rebekah Chesterfield, MD  ?potassium chloride SA (KLOR-CON M) 20 MEQ tablet Take 1 tablet (20  mEq total) by mouth daily for 10 days. 04/29/21 05/09/21  Pokhrel, Rebekah Chesterfield, MD  ?promethazine (PHENERGAN) 12.5 MG tablet Take 2 tablets (25 mg total) by mouth every 6 (six) hours as needed for nausea or vomiting. 04/29/21   Pokhrel, Rebekah Chesterfield, MD  ?QUEtiapine (SEROQUEL) 300 MG tablet Take 300 mg by mouth at bedtime. 03/18/21   [provider]  ? ? ?Physical Exam: ?Vitals:  ? 07/15/21 0200 07/15/21 0430 07/15/21 0630 07/15/21 7282  ?BP: 138/78 (!) 149/94 (!) 165/115   ?Pulse: 81 84 77   ?Resp: 16 18 18    ?Temp:    98 ?F (36.7 ?C)  ?TempSrc:    Oral  ?SpO2: 99% 97% 97%   ?Weight:      ?Height:      ? ?Physical Exam ?Vitals and nursing note reviewed.  ?Constitutional:   ?   Appearance: She is obese.  ?HENT:  ?   Head: Normocephalic.  ?   Mouth/Throat:  ?   Mouth: Mucous membranes are dry.  ?Eyes:  ?   General: No scleral icterus. ?   Pupils: Pupils are equal, round, and reactive to light.  ?Neck:  ?   Vascular: No JVD.  ?Cardiovascular:  ?   Rate and Rhythm: Normal rate and regular rhythm.  ?   Heart sounds: S1 normal and S2 normal.  ?Pulmonary:  ?   Effort: Pulmonary effort is normal.  ?   Breath sounds: Normal breath sounds.  ?Abdominal:  ?   General: Abdomen is protuberant. Bowel sounds are normal. There is no distension.  ?   Palpations: Abdomen is soft.  ?   Tenderness: There is abdominal tenderness in the epigastric area and left upper quadrant. There is no right CVA tenderness or left CVA tenderness.  ?Musculoskeletal:  ?   Cervical back: Neck supple.  ?   Right lower leg: No edema.  ?   Left lower leg: No edema.  ?Skin: ?   General: Skin is warm and dry.  ?Neurological:  ?   General: No focal deficit present.  ?   Mental Status: She is alert and oriented to person, place, and time.  ?Psychiatric:     ?   Mood and Affect: Mood normal.     ?   Behavior: Behavior normal.  ? ? ?Data Reviewed: ? ?There are no new results to review at this time. ? ?Assessment and Plan: ?Principal Problem: ?  Abdominal pain ?In the  setting of: ?  Gastroparesis ?Observation/MedSurg. ?Continue IV fluids. ?N.p.o. except for ice chips. ?Urinalysis still pending. ?Hydromorphone 1 mg IVP q 3 hours PRN. ?Metoclopramide 10 mg IVP every 6 hours. ?Prochlorperazine 10 mg IVP q 6 hours PRN. ?Dulcolax suppository and/or milk of molasses enema PRN. ?Follow-up CBC, CMP. ? ?Active Problems: ?  HTN (hypertension) ?Hold HCTZ to avoid dehydration. ?Hold atenolol due to occasional bradycardia. ?As needed parenteral antihypertensive. ? ?  Bipolar disorder (HCC) ?Continue Seroquel 400 mg at bedtime. ?Continue haloperidol 10 mg p.o. bedtime. ?Hold Lamictal per patient's request due to possible GI upset. ? ?  Hypokalemia ?Replacing. ?Magnesium was supplemented. ?Follow  potassium level. ? ?  Hypocalcemia ?Check calcium/albumin level in the morning. ?Further work-up depending on results. ? ?  Hypermagnesemia ?Continue IV fluids. ?Follow-up magnesium level as needed. ? ?  Class 3 obesity (HCC) ?BMI of 48.94 kg/m?. ?Lifestyle modifications. ?Follow-up closely with PCP. ? ? ? Advance Care Planning:   Code Status: Full code. ? ?Consults:  ? ?Family Communication:  ? ?Severity of Illness: ?The appropriate patient status for this patient is OBSERVATION. Observation status is judged to be reasonable and necessary in order to provide the required intensity of service to ensure the patient's safety. The patient's presenting symptoms, physical exam findings, and initial radiographic and laboratory data in the context of their medical condition is felt to place them at decreased risk for further clinical deterioration. Furthermore, it is anticipated that the patient will be medically stable for discharge from the hospital within 2 midnights of admission.  ? ?Author: ?Bobette Mo, MD ?07/15/2021 7:45 AM ? ?For on call review [email or web address].  ? ?This document was prepared using Dragon voice recognition software and may contain some unintended transcription errors. ?

## 2021-07-15 NOTE — ED Provider Notes (Signed)
?[facility]-EMERGENCY DEPT ?Provider Note ? ? ?CSN: [id number] ?Arrival date & time: 07/14/21  2342 ? ?  ? ?History ? ?Chief Complaint  ?Patient presents with  ? Abdominal Pain  ? ? ?Deborah Kelly is a 31 y.o. female. ? ?The history is provided by the patient.  ?Abdominal Pain ?She has history of hypertension, Graves' disease, bipolar disorder, gastroparesis, marijuana abuse and comes in because of a 4-day history of periumbilical pain with nausea and vomiting, typical of her flareups of gastroparesis.  She denies fever or chills and denies constipation or diarrhea.  She has been taking promethazine and pantoprazole at home without any benefit.  Of note, she denies any marijuana use. ?  ?Home Medications ?Prior to Admission medications   ?Medication Sig Start Date End Date Taking? Authorizing Provider  ?acetaminophen-codeine (TYLENOL #4) 300-60 MG tablet Take 1 tablet by mouth daily as needed for pain.    [provider]  ?atenolol-chlorthalidone (TENORETIC) 100-25 MG tablet Take 1 tablet by mouth every evening. 09/16/20   [provider]  ?clonazePAM (KLONOPIN) 1 MG tablet Take 1 mg by mouth 2 (two) times daily as needed for anxiety. 04/17/21   [provider]  ?haloperidol (HALDOL) 5 MG tablet Take 2 tablets (10 mg total) by mouth at bedtime. 04/29/21   Pokhrel, Rebekah Chesterfield, MD  ?lamoTRIgine (LAMICTAL) 200 MG tablet Take 200 mg by mouth daily at 12 noon. 02/11/21   [provider]  ?omeprazole (PRILOSEC OTC) 20 MG tablet Take 1 tablet by mouth daily as needed (for acid reflux). 08/30/20   [provider]  ?pantoprazole (PROTONIX) 40 MG tablet Take 1 tablet (40 mg total) by mouth daily. 04/29/21 04/29/22  Pokhrel, Rebekah Chesterfield, MD  ?potassium chloride SA (KLOR-CON M) 20 MEQ tablet Take 1 tablet (20 mEq total) by mouth daily for 10 days. 04/29/21 05/09/21  Pokhrel, Rebekah Chesterfield, MD  ?promethazine (PHENERGAN) 12.5 MG tablet Take 2 tablets (25 mg total) by mouth every 6 (six) hours  as needed for nausea or vomiting. 04/29/21   Pokhrel, Rebekah Chesterfield, MD  ?QUEtiapine (SEROQUEL) 300 MG tablet Take 300 mg by mouth at bedtime. 03/18/21   [provider]  ?   ? ?Allergies    ?Amlodipine, Dicyclomine, and Penicillins   ? ?Review of Systems   ?Review of Systems  ?Gastrointestinal:  Positive for abdominal pain.  ?All other systems reviewed and are negative. ? ?Physical Exam ?Updated Vital Signs ?BP (!) 141/79   Pulse 60   Temp 98 ?F (36.7 ?C) (Oral)   Resp 18   Ht 5\' 8"  (1.727 m)   Wt (!) 146 kg   SpO2 100%   BMI 48.94 kg/m?  ?Physical Exam ?Vitals and nursing note reviewed.  ?31 year old female, in obvious pain, but in no acute distress. Vital signs are significant for borderline elevated blood pressure. Oxygen saturation is 100%, which is normal. ?Head is normocephalic and atraumatic. PERRLA, EOMI. Oropharynx is clear. ?Neck is nontender and supple without adenopathy or JVD. ?Back is nontender and there is no CVA tenderness. ?Lungs are clear without rales, wheezes, or rhonchi. ?Chest is nontender. ?Heart has regular rate and rhythm without murmur. ?Abdomen is soft, flat, nontender with generalized tenderness.  There is no focal tenderness.  There is no rebound or guarding.  Peristalsis is hypoactive. ?Extremities have no cyanosis or edema, full range of motion is present. ?Skin is warm and dry without rash. ?Neurologic: Mental status is normal, cranial nerves are intact, moves all extremities equally. ? ?ED  Results / Procedures / Treatments   ?Labs ?(all labs ordered are listed, but only abnormal results are displayed) ?Labs Reviewed  ?COMPREHENSIVE METABOLIC PANEL - Abnormal; Notable for the following components:  ?    Result Value  ? Glucose, Bld 135 (*)   ? Creatinine, Ser 1.04 (*)   ? Total Protein 8.2 (*)   ? All other components within normal limits  ?CBC - Abnormal; Notable for the following components:  ? WBC 13.4 (*)   ? Platelets 404 (*)   ? All other components within normal limits   ?LIPASE, BLOOD  ?URINALYSIS, ROUTINE W REFLEX MICROSCOPIC  ?I-STAT BETA HCG BLOOD, ED ([facility], [facility], [facility] ONLY)  ? ?Procedures ?Procedures  ? ? ?Medications Ordered in ED ?Medications  ?promethazine (PHENERGAN) 25 mg in sodium chloride 0.9 % 50 mL IVPB (0 mg Intravenous Stopped 07/15/21 0355)  ?HYDROmorphone (DILAUDID) injection 1 mg (has no administration in time range)  ?lactated ringers bolus 1,000 mL (has no administration in time range)  ?HYDROmorphone (DILAUDID) injection 1 mg (1 mg Intravenous Given 07/15/21 0204)  ?lactated ringers bolus 1,000 mL (0 mLs Intravenous Stopped 07/15/21 0355)  ?haloperidol lactate (HALDOL) injection 5 mg (5 mg Intravenous Given 07/15/21 0202)  ?clonazepam (KLONOPIN) disintegrating tablet 1 mg (1 mg Oral Given 07/15/21 0236)  ?HYDROmorphone (DILAUDID) injection 1 mg (1 mg Intravenous Given 07/15/21 0353)  ?prochlorperazine (COMPAZINE) injection 10 mg (10 mg Intravenous Given 07/15/21 0352)  ? ? ?ED Course/ Medical Decision Making/ A&P ?  ?                        ?Medical Decision Making ?Amount and/or Complexity of Data Reviewed ?Labs: ordered. ? ?Risk ?Prescription drug management. ?Decision regarding hospitalization. ? ? ?Abdominal pain with vomiting consistent with gastroparesis.  Doubt bowel obstruction, gastroenteritis, pancreatitis.  Labs show mild leukocytosis and borderline thrombocytosis which is most likely reactive.  Lipase is normal.  Old records are reviewed, and she has numerous hospitalizations for similar presentations.  She will be given IV fluids, promethazine, hydromorphone, haloperidol. ? ?Following above noted medication, patient states that she is not feeling any better.  She is given a second dose of hydromorphone and a dose of prochlorperazine, also without any improvement.  She is given additional IV fluids and third dose of hydromorphone.  She will be admitted for ongoing IV fluids and pain management.  Case is discussed with Dr. Margo Aye of [facility], who agrees to  admit the patient. ? ?Final Clinical Impression(s) / ED Diagnoses ?Final diagnoses:  ?Gastroparesis  ? ? ?Rx / DC Orders ?ED Discharge Orders   ? ? None  ? ?  ? ? ?  ?Dione Booze, MD ?07/15/21 [phone] ? ?

## 2021-07-15 NOTE — ED Notes (Signed)
Rounded on pt. Pt currently denies any complaints and did not need any further assistance at this time.  ?

## 2021-07-15 NOTE — ED Notes (Signed)
ED TO INPATIENT HANDOFF REPORT ? ?Name/Age/Gender ?Deborah Kelly ?31 y.o. ?female ? ?Code Status ?Code Status History   ? ? Date Active Date Inactive Code Status Order ID Comments User Context  ? 04/26/2021 1622 04/29/2021 1541 Full Code [id number]  Jonnie Finner, DO Inpatient  ? ?  ? ? ?Home/SNF/Other ?Home ? ?Chief Complaint ?Abdominal pain [R10.9] ? ?Level of Care/Admitting Diagnosis ?ED Disposition   ? ? ED Disposition  ?Admit  ? Condition  ?--  ? Comment  ?Hospital Area: [facility] [id number] ? Level of Care: Med-Surg [16] ? May place patient in observation at [facility] or [place] if equivalent level of care is available:: Yes ? Covid Evaluation: Asymptomatic - no recent exposure (last 10 days) testing not required ? Diagnosis: Abdominal pain [id number] ? Admitting Physician: Kayleen Memos [id number] ? Attending Physician: Kayleen Memos [id number] ?  ?  ? ?  ? ? ?Medical History ?Past Medical History:  ?Diagnosis Date  ? Anxiety   ? Bipolar 1 disorder ([place])   ? Depression   ? Gastroparesis   ? Graves disease   ? Hyperthyroidism   ? ? ?Allergies ?Allergies  ?Allergen Reactions  ? Amlodipine Hives  ? Dicyclomine Other (See Comments)  ?  Pt states it makes her hands shake; pt also states a rash.   ? Penicillins   ? ? ?IV Location/Drains/Wounds ?Patient Lines/Drains/Airways Status   ? ? Active Line/Drains/Airways   ? ? Name Placement date Placement time Site Days  ? Peripheral IV 07/14/21 22 G 1" Anterior;Left;Proximal Forearm 07/14/21  2348  Forearm  1  ? ?  ?  ? ?  ? ? ?Labs/Imaging ?Results for orders placed or performed during the hospital encounter of 07/14/21 (from the past 48 hour(s))  ?Lipase, blood     Status: None  ? Collection Time: 07/15/21 12:02 AM  ?Result Value Ref Range  ? Lipase 27 11 - 51 U/L  ?  Comment: Performed at [facility], [place] [address] [place], [place] [address]  ?Comprehensive metabolic panel     Status: Abnormal  ? Collection Time: 07/15/21 12:02  AM  ?Result Value Ref Range  ? Sodium 142 135 - 145 mmol/L  ? Potassium 3.5 3.5 - 5.1 mmol/L  ? Chloride 106 98 - 111 mmol/L  ? CO2 26 22 - 32 mmol/L  ? Glucose, Bld 135 (H) 70 - 99 mg/dL  ?  Comment: Glucose reference range applies only to samples taken after fasting for at least 8 hours.  ? BUN 15 6 - 20 mg/dL  ? Creatinine, Ser 1.04 (H) 0.44 - 1.00 mg/dL  ? Calcium 8.9 8.9 - 10.3 mg/dL  ? Total Protein 8.2 (H) 6.5 - 8.1 g/dL  ? Albumin 4.6 3.5 - 5.0 g/dL  ? AST 32 15 - 41 U/L  ? ALT 20 0 - 44 U/L  ? Alkaline Phosphatase 70 38 - 126 U/L  ? Total Bilirubin 1.1 0.3 - 1.2 mg/dL  ? GFR, Estimated >60 >60 mL/min  ?  Comment: (NOTE) ?Calculated using the CKD-EPI Creatinine Equation (2021) ?  ? Anion gap 10 5 - 15  ?  Comment: Performed at [facility], [place] [address], [place], [place] [address]  ?CBC     Status: Abnormal  ? Collection Time: 07/15/21 12:02 AM  ?Result Value Ref Range  ? WBC 13.4 (H) 4.0 - 10.5 K/uL  ? RBC 4.84 3.87 - 5.11 MIL/uL  ? Hemoglobin 14.6  12.0 - 15.0 g/dL  ? HCT 43.5 36.0 - 46.0 %  ? MCV 89.9 80.0 - 100.0 fL  ? MCH 30.2 26.0 - 34.0 pg  ? MCHC 33.6 30.0 - 36.0 g/dL  ? RDW 14.2 11.5 - 15.5 %  ? Platelets 404 (H) 150 - 400 K/uL  ? nRBC 0.0 0.0 - 0.2 %  ?  Comment: Performed at [facility], [place] [address] [place], [place] [address]  ?I-Stat beta hCG blood, ED     Status: None  ? Collection Time: 07/15/21 12:06 AM  ?Result Value Ref Range  ? I-stat hCG, quantitative <5.0 <5 mIU/mL  ? Comment 3          ?  Comment:   GEST. AGE      CONC.  (mIU/mL) ?  <=1 WEEK        5 - 50 ?    2 WEEKS       50 - 500 ?    3 WEEKS       100 - 10,000 ?    4 WEEKS     1,000 - 30,000 ?       ?FEMALE AND NON-PREGNANT FEMALE: ?    LESS THAN 5 mIU/mL ?  ? ?No results found. ? ?Pending Labs ?Unresulted Labs (From admission, onward)  ? ?  Start     Ordered  ? 07/15/21 0634  Rapid urine drug screen (hospital performed)  ONCE - STAT,   STAT       ? 07/15/21 0633  ? 07/15/21 0002  Urinalysis,  Routine w reflex microscopic  Once,   URGENT       ? 07/15/21 0002  ? ?  ?  ? ?  ? ? ?Vitals/Pain ?Today's Vitals  ? 07/15/21 0236 07/15/21 0351 07/15/21 0418 07/15/21 0430  ?BP:    (!) 149/94  ?Pulse:    84  ?Resp:    18  ?Temp:      ?TempSrc:      ?SpO2:    97%  ?Weight:      ?Height:      ?PainSc: 7  8  7     ? ? ?Isolation Precautions ?No active isolations ? ?Medications ?Medications  ?promethazine (PHENERGAN) 25 mg in sodium chloride 0.9 % 50 mL IVPB (0 mg Intravenous Stopped 07/15/21 0355)  ?HYDROmorphone (DILAUDID) injection 1 mg (1 mg Intravenous Given 07/15/21 0204)  ?lactated ringers bolus 1,000 mL (0 mLs Intravenous Stopped 07/15/21 0355)  ?haloperidol lactate (HALDOL) injection 5 mg (5 mg Intravenous Given 07/15/21 0202)  ?clonazepam (KLONOPIN) disintegrating tablet 1 mg (1 mg Oral Given 07/15/21 0236)  ?HYDROmorphone (DILAUDID) injection 1 mg (1 mg Intravenous Given 07/15/21 0353)  ?prochlorperazine (COMPAZINE) injection 10 mg (10 mg Intravenous Given 07/15/21 0352)  ?HYDROmorphone (DILAUDID) injection 1 mg (1 mg Intravenous Given 07/15/21 0639)  ?lactated ringers bolus 1,000 mL (1,000 mLs Intravenous New Bag/Given 07/15/21 0640)  ? ? ?Mobility ?walks  ?

## 2021-07-15 NOTE — Progress Notes (Signed)
Transition of Care (TOC) Screening Note ? ?Patient Details  ?Name: Deborah Kelly ?Date of Birth: 10/27/1990 ? ?Transition of Care (TOC) CM/SW Contact:    ?Sherie Don, LCSW ?Phone Number: ?07/15/2021, 10:35 AM ? ?Transition of Care Department [facility]) has reviewed patient and no TOC needs have been identified at this time. We will continue to monitor patient advancement through interdisciplinary progression rounds. If new patient transition needs arise, please place a TOC consult. ?

## 2021-07-16 ENCOUNTER — Inpatient Hospital Stay (HOSPITAL_COMMUNITY): Payer: No Typology Code available for payment source

## 2021-07-16 DIAGNOSIS — G8929 Other chronic pain: Secondary | ICD-10-CM | POA: Diagnosis present

## 2021-07-16 DIAGNOSIS — R1010 Upper abdominal pain, unspecified: Secondary | ICD-10-CM

## 2021-07-16 DIAGNOSIS — E0500 Thyrotoxicosis with diffuse goiter without thyrotoxic crisis or storm: Secondary | ICD-10-CM | POA: Diagnosis present

## 2021-07-16 DIAGNOSIS — Z888 Allergy status to other drugs, medicaments and biological substances status: Secondary | ICD-10-CM | POA: Diagnosis not present

## 2021-07-16 DIAGNOSIS — E876 Hypokalemia: Secondary | ICD-10-CM | POA: Diagnosis present

## 2021-07-16 DIAGNOSIS — R591 Generalized enlarged lymph nodes: Secondary | ICD-10-CM | POA: Diagnosis present

## 2021-07-16 DIAGNOSIS — F310 Bipolar disorder, current episode hypomanic: Secondary | ICD-10-CM | POA: Diagnosis not present

## 2021-07-16 DIAGNOSIS — R9431 Abnormal electrocardiogram [ECG] [EKG]: Secondary | ICD-10-CM | POA: Diagnosis present

## 2021-07-16 DIAGNOSIS — R112 Nausea with vomiting, unspecified: Secondary | ICD-10-CM | POA: Diagnosis not present

## 2021-07-16 DIAGNOSIS — F419 Anxiety disorder, unspecified: Secondary | ICD-10-CM | POA: Diagnosis present

## 2021-07-16 DIAGNOSIS — Z6841 Body Mass Index (BMI) 40.0 and over, adult: Secondary | ICD-10-CM | POA: Diagnosis not present

## 2021-07-16 DIAGNOSIS — F1290 Cannabis use, unspecified, uncomplicated: Secondary | ICD-10-CM | POA: Diagnosis present

## 2021-07-16 DIAGNOSIS — K3184 Gastroparesis: Secondary | ICD-10-CM | POA: Diagnosis present

## 2021-07-16 DIAGNOSIS — R1013 Epigastric pain: Secondary | ICD-10-CM | POA: Diagnosis not present

## 2021-07-16 DIAGNOSIS — I10 Essential (primary) hypertension: Secondary | ICD-10-CM

## 2021-07-16 DIAGNOSIS — F319 Bipolar disorder, unspecified: Secondary | ICD-10-CM | POA: Diagnosis present

## 2021-07-16 DIAGNOSIS — Z880 Allergy status to penicillin: Secondary | ICD-10-CM | POA: Diagnosis not present

## 2021-07-16 DIAGNOSIS — R1110 Vomiting, unspecified: Secondary | ICD-10-CM | POA: Diagnosis present

## 2021-07-16 DIAGNOSIS — Z79899 Other long term (current) drug therapy: Secondary | ICD-10-CM | POA: Diagnosis not present

## 2021-07-16 DIAGNOSIS — Z87891 Personal history of nicotine dependence: Secondary | ICD-10-CM | POA: Diagnosis not present

## 2021-07-16 DIAGNOSIS — R1084 Generalized abdominal pain: Secondary | ICD-10-CM | POA: Diagnosis not present

## 2021-07-16 DIAGNOSIS — F3111 Bipolar disorder, current episode manic without psychotic features, mild: Secondary | ICD-10-CM | POA: Diagnosis not present

## 2021-07-16 LAB — COMPREHENSIVE METABOLIC PANEL
ALT: 26 U/L (ref 0–44)
AST: 31 U/L (ref 15–41)
Albumin: 3.7 g/dL (ref 3.5–5.0)
Alkaline Phosphatase: 58 U/L (ref 38–126)
Anion gap: 5 (ref 5–15)
BUN: 11 mg/dL (ref 6–20)
CO2: 29 mmol/L (ref 22–32)
Calcium: 7.9 mg/dL — ABNORMAL LOW (ref 8.9–10.3)
Chloride: 110 mmol/L (ref 98–111)
Creatinine, Ser: 0.72 mg/dL (ref 0.44–1.00)
GFR, Estimated: >60 mL/min (ref >60)
Glucose, Bld: 113 mg/dL — ABNORMAL HIGH (ref 70–99)
Potassium: 3 mmol/L — ABNORMAL LOW (ref 3.5–5.1)
Sodium: 144 mmol/L (ref 135–145)
Total Bilirubin: 0.9 mg/dL (ref 0.3–1.2)
Total Protein: 6.2 g/dL — ABNORMAL LOW (ref 6.5–8.1)

## 2021-07-16 LAB — CBC
HCT: 40 % (ref 36.0–46.0)
Hemoglobin: 12.9 g/dL (ref 12.0–15.0)
MCH: 29.9 pg (ref 26.0–34.0)
MCHC: 32.3 g/dL (ref 30.0–36.0)
MCV: 92.6 fL (ref 80.0–100.0)
Platelets: 316 10*3/uL (ref 150–400)
RBC: 4.32 MIL/uL (ref 3.87–5.11)
RDW: 14 % (ref 11.5–15.5)
WBC: 10 10*3/uL (ref 4.0–10.5)
nRBC: 0 % (ref 0.0–0.2)

## 2021-07-16 MED ORDER — HEPARIN SODIUM (PORCINE) 5000 UNIT/ML IJ SOLN
5000.0000 [IU] | Freq: Three times a day (TID) | INTRAMUSCULAR | Status: DC
  Administered 2021-07-17 – 2021-07-21 (×13): 5000 [IU] via SUBCUTANEOUS
  Filled 2021-07-16 (×16): qty 1

## 2021-07-16 MED ORDER — POTASSIUM CHLORIDE 10 MEQ/100ML IV SOLN
10.0000 meq | INTRAVENOUS | Status: AC
  Administered 2021-07-16 (×6): 10 meq via INTRAVENOUS
  Filled 2021-07-16 (×5): qty 100

## 2021-07-16 NOTE — Progress Notes (Signed)
24 hour chart audit completed 

## 2021-07-16 NOTE — Plan of Care (Signed)
°  Problem: Coping: °Goal: Level of anxiety will decrease °Outcome: Progressing °  °

## 2021-07-16 NOTE — Progress Notes (Signed)
?PROGRESS NOTE ? ? ? ?Deborah Kelly  NUU:[id number] DOB: 1991/01/16 DOA: 07/14/2021 ?PCP: Pcp, No  ? ?Brief Narrative:  ? 31 y.o. BF ETOHabuse, cannabis misuse, anxiety, depression, bipolar 1 disorder, history of suicide attempt, history of chronic pelvic pain in female, dysmenorrhea, elevated prolactin level, laparoscopic with salpingectomy at age 34, Graves' disease/hyperthyroidism, Gastroparesis (started post laparoscopic surgery), Hx intractable abdominal pain and vomiting, class III obesity with a BMI of 48.94 kg/m?  ? ?Admitted with complaints of periumbilical abdominal pain associated with nausea and multiple episodes of emesis for the past 4 days.  She has also been constipated for the past few days.  No hematemesis or bilious vomiting.  No melena, hematochezia, flank pain, dysuria, frequency or hematuria.  No fever, chills, but has been having a frontal headache.  No sore throat, rhinorrhea, dyspnea, wheezing or hemoptysis.  No chest pain, palpitations, diaphoresis, PND, orthopnea or recent pitting edema lower extremities. ? ?ED course: Initial vital signs were temperature 98 ?F, pulse 57, respirations 16, BP 144/82 mmHg O2 sat 99% on room air.  The patient received 2000 mL of LR bolus hydromorphone 1 mg IVP x3, clonazepam 1 mg p.o. x1 and prochlorperazine 10 mg IVP. ? ? ?Subjective: ?A/O x4, eats chewable cannabis for anxiety because does not want to take medication.  Was unaware could cause/exacerbate gastroparesis ? ? ?Assessment & Plan: ? Covid vaccination; ? ?Principal Problem: ?  Abdominal pain ?Active Problems: ?  Gastroparesis ?  HTN (hypertension) ?  Bipolar disorder (HCC) ?  Hypokalemia ?  Class 3 obesity (HCC) ?  Hypocalcemia ?  Hypermagnesemia ? ?Gastroparesis vs ileus/SBO ?-5/10 KUB pending: Depending upon results may require CT abdomen with oral contrast ? ? ?Substance Abuse/Cannabinoid Hyperemesis Syndrome ?-5/10 counseled patient that chronic daily cannabis use can cause/exacerbate  gastroparesis. ? ?HTN (hypertension) ?Hold HCTZ to avoid dehydration. ?Hold atenolol due to occasional bradycardia. ?As needed parenteral antihypertensive. ?  ?  Bipolar disorder (HCC) ?Continue Seroquel 400 mg at bedtime. ?Continue haloperidol 10 mg p.o. bedtime. ?Hold Lamictal per patient's request due to possible GI upset. ?  ?  Hypokalemia ?-Potassium goal>4 ?-5/10 K-Dur 60 mEq ?-Normal saline+ KCl 20 mEq/L@125ml /hr ? ? ?Hypocalcemia ?Check calcium/albumin level in the morning. ?Further work-up depending on results. ?  ?Hypomagnesmia ?- Magnesium goal> 2 ?  ?Class 3 obesity (48.94 kg/m?). ?Lifestyle modifications. ?Follow-up closely with PCP. ? ? ?Mobility Assessment (last 72 hours)   ? ? Mobility Assessment   ? ? Row Name 07/15/21 2249 07/15/21 2241 07/15/21 0951  ?  ?  ? Does patient have an order for bedrest or is patient medically unstable No - Continue assessment No - Continue assessment No - Continue assessment    ? What is the highest level of mobility based on the progressive mobility assessment? Level 5 (Walks with assist in room/hall) - Balance while stepping forward/back and can walk in room with assist - Complete Level 5 (Walks with assist in room/hall) - Balance while stepping forward/back and can walk in room with assist - Complete Level 5 (Walks with assist in room/hall) - Balance while stepping forward/back and can walk in room with assist - Complete    ? ?  ?  ? ?  ? ? ?DVT prophylaxis: Subcu heparin ?Code Status: Full ?Family Communication:  ?Status is: Inpatient ? ? ? ?Dispo: The patient is from: Home ?             Anticipated d/c is to: Home ?  Anticipated d/c date is: > 3 days ?             Patient currently is not medically stable to d/c. ? ? ? ? ? ?Consultants:  ? ? ?Procedures/Significant Events:  ? ? ? ?I have personally reviewed and interpreted all radiology studies and my findings are as above. ? ?VENTILATOR  SETTINGS: ? ? ? ?Cultures ? ? ?Antimicrobials: ? ? ? ?Devices ?  ? ?LINES / TUBES:  ? ? ? ? ?Continuous Infusions: ? 0.9 % NaCl with KCl 20 mEq / L 125 mL/hr at 07/16/21 0525  ? promethazine (PHENERGAN) injection (IM or IVPB) Stopped (07/15/21 2114)  ? ? ? ?Objective: ?Vitals:  ? 07/15/21 2121 07/16/21 0128 07/16/21 0517 07/16/21 0517  ?BP: 111/68 119/65 117/77 117/77  ?Pulse: 68 86 73 68  ?Resp: 18 18 20 20   ?Temp: 97.9 ?F (36.6 ?C) 98 ?F (36.7 ?C) 97.6 ?F (36.4 ?C)   ?TempSrc: Oral     ?SpO2: 100% 97% 98% 98%  ?Weight:      ?Height:      ? ? ?Intake/Output Summary (Last 24 hours) at 07/16/2021 0646 ?Last data filed at 07/16/2021 09/15/2021 ?Gross per 24 hour  ?Intake 1919.58 ml  ?Output 500 ml  ?Net 1419.58 ml  ? ?Filed Weights  ? 07/15/21 0000  ?Weight: (!) 146 kg  ? ? ?Examination: ? ?General: A/O x4, No acute respiratory distress, continued nausea, negative vomiting ?Eyes: negative scleral hemorrhage, negative anisocoria, negative icterus ?ENT: Negative Runny nose, negative gingival bleeding, ?Neck:  Negative scars, masses, torticollis, lymphadenopathy, JVD ?Lungs: Clear to auscultation bilaterally without wheezes or crackles ?Cardiovascular: Regular rate and rhythm without murmur gallop or rub normal S1 and S2 ?Abdomen: MORBIDLY OBESE, negative abdominal pain, nondistended, negative soft, bowel sounds, no rebound, no ascites, no appreciable mass ?Extremities: No significant cyanosis, clubbing, or edema bilateral lower extremities ?Skin: Negative rashes, lesions, ulcers ?Psychiatric:  Negative depression, negative anxiety, negative fatigue, negative mania  ?Central nervous system:  Cranial nerves II through XII intact, tongue/uvula midline, all extremities muscle strength 5/5, sensation intact throughout, negative dysarthria, negative expressive aphasia, negative receptive aphasia. ? ?.  ? ? ? ?Data Reviewed: Care during the described time interval was provided by me .  I have reviewed this patient's available data,  including medical history, events of note, physical examination, and all test results as part of my evaluation.  ? ?CBC: ?Recent Labs  ?Lab 07/15/21 ?0002 07/15/21 ?09/14/21 07/16/21 ?0434  ?WBC 13.4* 16.2* 10.0  ?HGB 14.6 13.3 12.9  ?HCT 43.5 41.5 40.0  ?MCV 89.9 92.0 92.6  ?PLT 404* 373 316  ? ?Basic Metabolic Panel: ?Recent Labs  ?Lab 07/15/21 ?0002 07/15/21 ?09/14/21 07/16/21 ?0434  ?NA 142 145 144  ?K 3.5 3.3* 3.0*  ?CL 106 106 110  ?CO2 26 29 29   ?GLUCOSE 135* 123* 113*  ?BUN 15 15 11   ?CREATININE 1.04* 0.90 0.72  ?CALCIUM 8.9 8.7* 7.9*  ?MG  --  2.5*  --   ? ?GFR: ?Estimated Creatinine Clearance: 159.8 mL/min (by C-G formula based on SCr of 0.72 mg/dL). ?Liver Function Tests: ?Recent Labs  ?Lab 07/15/21 ?0002 07/16/21 ?0434  ?AST 32 31  ?ALT 20 26  ?ALKPHOS 70 58  ?BILITOT 1.1 0.9  ?PROT 8.2* 6.2*  ?ALBUMIN 4.6 3.7  ? ?Recent Labs  ?Lab 07/15/21 ?0002  ?LIPASE 27  ? ?No results for input(s): AMMONIA in the last 168 hours. ?Coagulation Profile: ?No results for input(s): INR, PROTIME in the last 168 hours. ?  Cardiac Enzymes: ?No results for input(s): CKTOTAL, CKMB, CKMBINDEX, TROPONINI in the last 168 hours. ?BNP (last 3 results) ?No results for input(s): PROBNP in the last 8760 hours. ?HbA1C: ?No results for input(s): HGBA1C in the last 72 hours. ?CBG: ?No results for input(s): GLUCAP in the last 168 hours. ?Lipid Profile: ?No results for input(s): CHOL, HDL, LDLCALC, TRIG, CHOLHDL, LDLDIRECT in the last 72 hours. ?Thyroid Function Tests: ?No results for input(s): TSH, T4TOTAL, FREET4, T3FREE, THYROIDAB in the last 72 hours. ?Anemia Panel: ?No results for input(s): VITAMINB12, FOLATE, FERRITIN, TIBC, IRON, RETICCTPCT in the last 72 hours. ?Urine analysis: ?   ?Component Value Date/Time  ? COLORURINE AMBER (A) 07/15/2021 2114  ? APPEARANCEUR HAZY (A) 07/15/2021 2114  ? LABSPEC 1.033 (H) 07/15/2021 2114  ? PHURINE 6.0 07/15/2021 2114  ? GLUCOSEU NEGATIVE 07/15/2021 2114  ? HGBUR NEGATIVE 07/15/2021 2114  ? BILIRUBINUR  NEGATIVE 07/15/2021 2114  ? KETONESUR 5 (A) 07/15/2021 2114  ? PROTEINUR 30 (A) 07/15/2021 2114  ? NITRITE NEGATIVE 07/15/2021 2114  ? LEUKOCYTESUR TRACE (A) 07/15/2021 2114  ? ?Sepsis Labs: ?@[email or web address] (procalcitonin:4,lacticidven:4) ? ?)No results found for this or any previous vis

## 2021-07-17 ENCOUNTER — Inpatient Hospital Stay (HOSPITAL_COMMUNITY): Payer: No Typology Code available for payment source

## 2021-07-17 DIAGNOSIS — F1290 Cannabis use, unspecified, uncomplicated: Secondary | ICD-10-CM

## 2021-07-17 DIAGNOSIS — R112 Nausea with vomiting, unspecified: Secondary | ICD-10-CM

## 2021-07-17 DIAGNOSIS — K3184 Gastroparesis: Secondary | ICD-10-CM | POA: Diagnosis not present

## 2021-07-17 DIAGNOSIS — F310 Bipolar disorder, current episode hypomanic: Secondary | ICD-10-CM | POA: Diagnosis not present

## 2021-07-17 DIAGNOSIS — R1084 Generalized abdominal pain: Secondary | ICD-10-CM

## 2021-07-17 LAB — COMPREHENSIVE METABOLIC PANEL
ALT: 47 U/L — ABNORMAL HIGH (ref 0–44)
AST: 45 U/L — ABNORMAL HIGH (ref 15–41)
Albumin: 3.3 g/dL — ABNORMAL LOW (ref 3.5–5.0)
Alkaline Phosphatase: 62 U/L (ref 38–126)
Anion gap: 7 (ref 5–15)
BUN: 5 mg/dL — ABNORMAL LOW (ref 6–20)
CO2: 26 mmol/L (ref 22–32)
Calcium: 7.5 mg/dL — ABNORMAL LOW (ref 8.9–10.3)
Chloride: 107 mmol/L (ref 98–111)
Creatinine, Ser: 0.68 mg/dL (ref 0.44–1.00)
GFR, Estimated: >60 mL/min (ref >60)
Glucose, Bld: 83 mg/dL (ref 70–99)
Potassium: 3.7 mmol/L (ref 3.5–5.1)
Sodium: 140 mmol/L (ref 135–145)
Total Bilirubin: 1.7 mg/dL — ABNORMAL HIGH (ref 0.3–1.2)
Total Protein: 5.9 g/dL — ABNORMAL LOW (ref 6.5–8.1)

## 2021-07-17 LAB — CBC WITH DIFFERENTIAL/PLATELET
Abs Immature Granulocytes: 0.03 10*3/uL (ref 0.00–0.07)
Basophils Absolute: 0 10*3/uL (ref 0.0–0.1)
Basophils Relative: 1 %
Eosinophils Absolute: 0.4 10*3/uL (ref 0.0–0.5)
Eosinophils Relative: 5 %
HCT: 38.1 % (ref 36.0–46.0)
Hemoglobin: 12.8 g/dL (ref 12.0–15.0)
Immature Granulocytes: 0 %
Lymphocytes Relative: 41 %
Lymphs Abs: 3.5 10*3/uL (ref 0.7–4.0)
MCH: 30.6 pg (ref 26.0–34.0)
MCHC: 33.6 g/dL (ref 30.0–36.0)
MCV: 91.1 fL (ref 80.0–100.0)
Monocytes Absolute: 0.7 10*3/uL (ref 0.1–1.0)
Monocytes Relative: 9 %
Neutro Abs: 3.8 10*3/uL (ref 1.7–7.7)
Neutrophils Relative %: 44 %
Platelets: 323 10*3/uL (ref 150–400)
RBC: 4.18 MIL/uL (ref 3.87–5.11)
RDW: 13.5 % (ref 11.5–15.5)
WBC: 8.5 10*3/uL (ref 4.0–10.5)
nRBC: 0 % (ref 0.0–0.2)

## 2021-07-17 LAB — MAGNESIUM: Magnesium: 2.1 mg/dL (ref 1.7–2.4)

## 2021-07-17 LAB — PHOSPHORUS: Phosphorus: 2.6 mg/dL (ref 2.5–4.6)

## 2021-07-17 MED ORDER — LAMOTRIGINE 25 MG PO TABS
25.0000 mg | ORAL_TABLET | Freq: Every day | ORAL | Status: DC
  Administered 2021-07-17 – 2021-07-22 (×6): 25 mg via ORAL
  Filled 2021-07-17 (×6): qty 1

## 2021-07-17 MED ORDER — SODIUM CHLORIDE (PF) 0.9 % IJ SOLN
INTRAMUSCULAR | Status: AC
  Filled 2021-07-17: qty 50

## 2021-07-17 MED ORDER — LAMOTRIGINE 100 MG PO TABS: 200.0000 mg | ORAL_TABLET | Freq: Every day | ORAL | Status: DC

## 2021-07-17 MED ORDER — METOCLOPRAMIDE HCL 10 MG PO TABS
10.0000 mg | ORAL_TABLET | Freq: Three times a day (TID) | ORAL | Status: DC
  Administered 2021-07-17 – 2021-07-20 (×10): 10 mg via ORAL
  Filled 2021-07-17 (×10): qty 1

## 2021-07-17 MED ORDER — IOHEXOL 300 MG/ML  SOLN
100.0000 mL | Freq: Once | INTRAMUSCULAR | Status: AC | PRN
  Administered 2021-07-17: 100 mL via INTRAVENOUS

## 2021-07-17 MED ORDER — IOHEXOL 9 MG/ML PO SOLN
500.0000 mL | ORAL | Status: AC
  Administered 2021-07-17 (×2): 500 mL via ORAL

## 2021-07-17 MED ORDER — LAMOTRIGINE 100 MG PO TABS: 100.0000 mg | ORAL_TABLET | Freq: Every day | ORAL | Status: DC

## 2021-07-17 MED ORDER — IOHEXOL 9 MG/ML PO SOLN
ORAL | Status: AC
  Filled 2021-07-17: qty 1000

## 2021-07-17 MED ORDER — LAMOTRIGINE 25 MG PO TABS: 150.0000 mg | ORAL_TABLET | Freq: Every day | ORAL | Status: DC

## 2021-07-17 MED ORDER — CALCIUM GLUCONATE-NACL 2-0.675 GM/100ML-% IV SOLN
2.0000 g | Freq: Once | INTRAVENOUS | Status: AC
  Administered 2021-07-17: 2000 mg via INTRAVENOUS
  Filled 2021-07-17: qty 100

## 2021-07-17 MED ORDER — LAMOTRIGINE 25 MG PO TABS: 50.0000 mg | ORAL_TABLET | Freq: Every day | ORAL | Status: DC

## 2021-07-17 NOTE — Progress Notes (Signed)
?PROGRESS NOTE ? ? ? ?Deborah Kelly  VEL:[id number] DOB: Aug 01, 1990 DOA: 07/14/2021 ?PCP: Pcp, No  ? ?Brief Narrative:  ? 31 y.o. BF ETOHabuse, cannabis misuse, anxiety, depression, bipolar 1 disorder, history of suicide attempt, history of chronic pelvic pain in female, dysmenorrhea, elevated prolactin level, laparoscopic with salpingectomy at age 56, Graves' disease/hyperthyroidism, Gastroparesis (started post laparoscopic surgery), Hx intractable abdominal pain and vomiting, class III obesity with a BMI of 48.94 kg/m?  ? ?Admitted with complaints of periumbilical abdominal pain associated with nausea and multiple episodes of emesis for the past 4 days.  She has also been constipated for the past few days.  No hematemesis or bilious vomiting.  No melena, hematochezia, flank pain, dysuria, frequency or hematuria.  No fever, chills, but has been having a frontal headache.  No sore throat, rhinorrhea, dyspnea, wheezing or hemoptysis.  No chest pain, palpitations, diaphoresis, PND, orthopnea or recent pitting edema lower extremities. ? ?ED course: Initial vital signs were temperature 98 ?F, pulse 57, respirations 16, BP 144/82 mmHg O2 sat 99% on room air.  The patient received 2000 mL of LR bolus hydromorphone 1 mg IVP x3, clonazepam 1 mg p.o. x1 and prochlorperazine 10 mg IVP. ? ? ?Subjective: ?5/11 A/O x4.  Positive abdominal pain, positive nausea.  Negative flatulence/BM last 24 hours. ? ? ?Assessment & Plan: ? Covid vaccination; ? ?Principal Problem: ?  Abdominal pain ?Active Problems: ?  Gastroparesis ?  HTN (hypertension) ?  Bipolar disorder (HCC) ?  Hypokalemia ?  Class 3 obesity (HCC) ?  Hypocalcemia ?  Hypermagnesemia ?  Cannabinoid hyperemesis syndrome ? ?Gastroparesis vs ileus/SBO ?-5/10 KUB negative: Depending upon results may require CT abdomen with oral contrast ?-5/11 CT abdomen with oral contrast: No findings of SBO/ileus see results below  ?-5/11 gastroparesis most likely secondary to cannabinoid  hyperemesis syndrome. ?- 5/11 Reglan 10 mg before every meal/nightly ?- 5/11 clear liquid diet ? ?Substance Abuse/Cannabinoid Hyperemesis Syndrome ?-5/10 counseled patient that chronic daily cannabis use can cause/exacerbate gastroparesis. ? ?HTN (hypertension) ?-Currently controlled without BP medication ? ?QT prolongation ?- 5/11 EKG NSR, NEGATIVE QT prolongation ?  ?Bipolar disorder (HCC) ?-Continue Seroquel 400 mg at bedtime. ?-Continue haloperidol 10 mg p.o. bedtime. ?-5/11 restart Lamictal 200 mg daily . ?  ?  Hypokalemia ?-Potassium goal>4 ?-5/10 K-Dur 60 mEq ?-Normal saline+ KCl 20 mEq/L@125ml /hr ? ? ?Hypocalcemia ?-5/11 corrected calcium = 8.0  ?-5/11 calcium gluconate 2 g ?  ?Hypomagnesmia ?- Magnesium goal> 2 ?  ?Class 3 obesity (48.94 kg/m?). ?Lifestyle modifications. ?Follow-up closely with PCP. ? ? ?Mobility Assessment (last 72 hours)   ? ? Mobility Assessment   ? ? Row Name 07/17/21 2585 07/15/21 2249 07/15/21 2241 07/15/21 0951  ?  ? Does patient have an order for bedrest or is patient medically unstable No - Continue assessment No - Continue assessment No - Continue assessment No - Continue assessment   ? What is the highest level of mobility based on the progressive mobility assessment? Level 5 (Walks with assist in room/hall) - Balance while stepping forward/back and can walk in room with assist - Complete Level 5 (Walks with assist in room/hall) - Balance while stepping forward/back and can walk in room with assist - Complete Level 5 (Walks with assist in room/hall) - Balance while stepping forward/back and can walk in room with assist - Complete Level 5 (Walks with assist in room/hall) - Balance while stepping forward/back and can walk in room with assist - Complete   ? ?  ?  ? ?  ? ? ?  DVT prophylaxis: Subcu heparin ?Code Status: Full ?Family Communication:  ?Status is: Inpatient ? ? ? ?Dispo: The patient is from: Home ?             Anticipated d/c is to: Home ?             Anticipated d/c date  is: > 3 days ?             Patient currently is not medically stable to d/c. ? ? ? ? ? ?Consultants:  ? ? ?Procedures/Significant Events:  ?5/11 KUB: No acute findings ?5/11 CT abdomen pelvis W contrast ?No acute findings in the abdomen or pelvis. Specifically, no ?findings to explain the patient's history of abdominal pain with ?nausea and vomiting. ?  ?Borderline to mild lymphadenopathy in the ileocolic mesentery is ?stable in the 3 month interval since prior study, most suggestive of ?benign/reactive etiology. Follow-up CT in 3-6 months  ? ? ?I have personally reviewed and interpreted all radiology studies and my findings are as above. ? ?VENTILATOR SETTINGS: ? ? ? ?Cultures ? ? ?Antimicrobials: ? ? ? ?Devices ?  ? ?LINES / TUBES:  ? ? ? ? ?Continuous Infusions: ? 0.9 % NaCl with KCl 20 mEq / L 125 mL/hr at 07/17/21 1706  ? calcium gluconate    ? promethazine (PHENERGAN) injection (IM or IVPB) 25 mg (07/16/21 2139)  ? ? ? ?Objective: ?Vitals:  ? 07/16/21 1429 07/16/21 2121 07/17/21 0538 07/17/21 1224  ?BP: 107/77 110/65 100/62 129/88  ?Pulse: 69 71 86 83  ?Resp: 17 16 16 16   ?Temp: 98.7 ?F (37.1 ?C) 98.9 ?F (37.2 ?C) 97.8 ?F (36.6 ?C) 98.6 ?F (37 ?C)  ?TempSrc: Oral Oral Oral   ?SpO2: 99% 100% 99% 98%  ?Weight:      ?Height:      ? ? ?Intake/Output Summary (Last 24 hours) at 07/17/2021 1736 ?Last data filed at 07/17/2021 1300 ?Gross per 24 hour  ?Intake 1172.03 ml  ?Output 1100 ml  ?Net 72.03 ml  ? ?Filed Weights  ? 07/15/21 0000  ?Weight: (!) 146 kg  ? ? ?Examination: ? ?General: A/O x4, No acute respiratory distress, continued nausea, negative vomiting ?Eyes: negative scleral hemorrhage, negative anisocoria, negative icterus ?ENT: Negative Runny nose, negative gingival bleeding, ?Neck:  Negative scars, masses, torticollis, lymphadenopathy, JVD ?Lungs: Clear to auscultation bilaterally without wheezes or crackles ?Cardiovascular: Regular rate and rhythm without murmur gallop or rub normal S1 and S2 ?Abdomen:  MORBIDLY OBESE, positive abdominal pain, nondistended, negative soft, bowel sounds, no rebound, no ascites, no appreciable mass ?Extremities: No significant cyanosis, clubbing, or edema bilateral lower extremities ?Skin: Negative rashes, lesions, ulcers ?Psychiatric:  Negative depression, negative anxiety, negative fatigue, negative mania  ?Central nervous system:  Cranial nerves II through XII intact, tongue/uvula midline, all extremities muscle strength 5/5, sensation intact throughout, negative dysarthria, negative expressive aphasia, negative receptive aphasia. ? ?.  ? ? ? ?Data Reviewed: Care during the described time interval was provided by me .  I have reviewed this patient's available data, including medical history, events of note, physical examination, and all test results as part of my evaluation.  ? ?CBC: ?Recent Labs  ?Lab 07/15/21 ?0002 07/15/21 ?0936 07/16/21 ?0434 07/17/21 ?09/16/21  ?WBC 13.4* 16.2* 10.0 8.5  ?NEUTROABS  --   --   --  3.8  ?HGB 14.6 13.3 12.9 12.8  ?HCT 43.5 41.5 40.0 38.1  ?MCV 89.9 92.0 92.6 91.1  ?PLT 404* 373 316 323  ? ?Basic Metabolic Panel: ?Recent  Labs  ?Lab 07/15/21 ?0002 07/15/21 ?0936 07/16/21 ?0434 07/17/21 ?[id number]  ?NA 142 145 144 140  ?K 3.5 3.3* 3.0* 3.7  ?CL 106 106 110 107  ?CO2 26 29 29 26   ?GLUCOSE 135* 123* 113* 83  ?BUN 15 15 11  5*  ?CREATININE 1.04* 0.90 0.72 0.68  ?CALCIUM 8.9 8.7* 7.9* 7.5*  ?MG  --  2.5*  --  2.1  ?PHOS  --   --   --  2.6  ? ?GFR: ?Estimated Creatinine Clearance: 159.8 mL/min (by C-G formula based on SCr of 0.68 mg/dL). ?Liver Function Tests: ?Recent Labs  ?Lab 07/15/21 ?0002 07/16/21 ?0434 07/17/21 ?[id number]  ?AST 32 31 45*  ?ALT 20 26 47*  ?ALKPHOS 70 58 62  ?BILITOT 1.1 0.9 1.7*  ?PROT 8.2* 6.2* 5.9*  ?ALBUMIN 4.6 3.7 3.3*  ? ?Recent Labs  ?Lab 07/15/21 ?0002  ?LIPASE 27  ? ?No results for input(s): AMMONIA in the last 168 hours. ?Coagulation Profile: ?No results for input(s): INR, PROTIME in the last 168 hours. ?Cardiac Enzymes: ?No results for  input(s): CKTOTAL, CKMB, CKMBINDEX, TROPONINI in the last 168 hours. ?BNP (last 3 results) ?No results for input(s): PROBNP in the last 8760 hours. ?HbA1C: ?No results for input(s): HGBA1C in the last 72 hours. ?C

## 2021-07-18 DIAGNOSIS — R112 Nausea with vomiting, unspecified: Secondary | ICD-10-CM | POA: Diagnosis not present

## 2021-07-18 DIAGNOSIS — F310 Bipolar disorder, current episode hypomanic: Secondary | ICD-10-CM | POA: Diagnosis not present

## 2021-07-18 DIAGNOSIS — K3184 Gastroparesis: Secondary | ICD-10-CM | POA: Diagnosis not present

## 2021-07-18 LAB — CBC WITH DIFFERENTIAL/PLATELET
Abs Immature Granulocytes: 0.04 10*3/uL (ref 0.00–0.07)
Basophils Absolute: 0 10*3/uL (ref 0.0–0.1)
Basophils Relative: 1 %
Eosinophils Absolute: 0.4 10*3/uL (ref 0.0–0.5)
Eosinophils Relative: 5 %
HCT: 36 % (ref 36.0–46.0)
Hemoglobin: 12.1 g/dL (ref 12.0–15.0)
Immature Granulocytes: 1 %
Lymphocytes Relative: 43 %
Lymphs Abs: 3.5 10*3/uL (ref 0.7–4.0)
MCH: 29.9 pg (ref 26.0–34.0)
MCHC: 33.6 g/dL (ref 30.0–36.0)
MCV: 88.9 fL (ref 80.0–100.0)
Monocytes Absolute: 0.7 10*3/uL (ref 0.1–1.0)
Monocytes Relative: 8 %
Neutro Abs: 3.4 10*3/uL (ref 1.7–7.7)
Neutrophils Relative %: 42 %
Platelets: 331 10*3/uL (ref 150–400)
RBC: 4.05 MIL/uL (ref 3.87–5.11)
RDW: 13.2 % (ref 11.5–15.5)
WBC: 8 10*3/uL (ref 4.0–10.5)
nRBC: 0 % (ref 0.0–0.2)

## 2021-07-18 LAB — MAGNESIUM: Magnesium: 1.8 mg/dL (ref 1.7–2.4)

## 2021-07-18 LAB — COMPREHENSIVE METABOLIC PANEL
ALT: 57 U/L — ABNORMAL HIGH (ref 0–44)
AST: 45 U/L — ABNORMAL HIGH (ref 15–41)
Albumin: 3 g/dL — ABNORMAL LOW (ref 3.5–5.0)
Alkaline Phosphatase: 60 U/L (ref 38–126)
Anion gap: 4 — ABNORMAL LOW (ref 5–15)
BUN: <5 mg/dL — ABNORMAL LOW (ref 6–20)
CO2: 24 mmol/L (ref 22–32)
Calcium: 8 mg/dL — ABNORMAL LOW (ref 8.9–10.3)
Chloride: 109 mmol/L (ref 98–111)
Creatinine, Ser: 0.69 mg/dL (ref 0.44–1.00)
GFR, Estimated: >60 mL/min (ref >60)
Glucose, Bld: 101 mg/dL — ABNORMAL HIGH (ref 70–99)
Potassium: 3.7 mmol/L (ref 3.5–5.1)
Sodium: 137 mmol/L (ref 135–145)
Total Bilirubin: 1.2 mg/dL (ref 0.3–1.2)
Total Protein: 5.5 g/dL — ABNORMAL LOW (ref 6.5–8.1)

## 2021-07-18 LAB — PHOSPHORUS: Phosphorus: 3.3 mg/dL (ref 2.5–4.6)

## 2021-07-18 NOTE — Progress Notes (Signed)
?PROGRESS NOTE ? ? ? ?Deborah Kelly  [id number] DOB: 1991/03/08 DOA: 07/14/2021 ?PCP: Pcp, No  ? ?Brief Narrative:  ? 31 y.o. BF ETOHabuse, cannabis misuse, anxiety, depression, bipolar 1 disorder, history of suicide attempt, history of chronic pelvic pain in female, dysmenorrhea, elevated prolactin level, laparoscopic with salpingectomy at age 14, Graves' disease/hyperthyroidism, Gastroparesis (started post laparoscopic surgery), Hx intractable abdominal pain and vomiting, class III obesity with a BMI of 48.94 kg/m?  ? ?Admitted with complaints of periumbilical abdominal pain associated with nausea and multiple episodes of emesis for the past 4 days.  She has also been constipated for the past few days.  No hematemesis or bilious vomiting.  No melena, hematochezia, flank pain, dysuria, frequency or hematuria.  No fever, chills, but has been having a frontal headache.  No sore throat, rhinorrhea, dyspnea, wheezing or hemoptysis.  No chest pain, palpitations, diaphoresis, PND, orthopnea or recent pitting edema lower extremities. ? ?ED course: Initial vital signs were temperature 98 ?F, pulse 57, respirations 16, BP 144/82 mmHg O2 sat 99% on room air.  The patient received 2000 mL of LR bolus hydromorphone 1 mg IVP x3, clonazepam 1 mg p.o. x1 and prochlorperazine 10 mg IVP. ? ? ?Subjective: ?5/12 afebrile overnight.  Negative nausea, negative vomiting.  Minimal abdominal pain.  Positive multiple episodes of diarrhea overnight. ? ? ?Assessment & Plan: ? Covid vaccination; ? ?Principal Problem: ?  Abdominal pain ?Active Problems: ?  Gastroparesis ?  HTN (hypertension) ?  Bipolar disorder ([place]) ?  Hypokalemia ?  Class 3 obesity (HCC) ?  Hypocalcemia ?  Hypermagnesemia ?  Cannabinoid hyperemesis syndrome ? ?Gastroparesis vs ileus/SBO ?-Patient seen by followed by Dr. Brunilda Payor (gastroenterology), in [place]: 01/13/2016 gastric emptying study revealed mild late-phase Gastroparesis.  ?-Patient has not established GI  care since moving to [place]. ?-5/10 KUB negative: Depending upon results may require CT abdomen with oral contrast ?-5/11 CT abdomen with oral contrast: No findings of SBO/ileus see results below  ?-5/11 gastroparesis most likely secondary to cannabinoid hyperemesis syndrome. ?- 5/11 Reglan 10 mg before every meal/nightly ?- 5/11 clear liquid diet ?-5/12 advance diet to bland ? ?Substance Abuse/Cannabinoid Hyperemesis Syndrome ?-5/10 counseled patient that chronic daily cannabis use can cause/exacerbate gastroparesis. ?-Per care everywhere notes patient admitted to [facility] inpatient care specialist 03/14/2021, was also counseled on discontinuing cannabis use. ? ?HTN (hypertension) ?-Currently controlled without BP medication ? ?QT prolongation ?- 5/11 EKG NSR, NEGATIVE QT prolongation ?  ?Bipolar disorder ([place]) ?-Continue Seroquel 400 mg at bedtime. ?-Continue haloperidol 10 mg p.o. bedtime. ?-5/11 restart Lamicta 25 mg daily .  Pharmacy titrating up to therapeutic dose of 200 mg over several weeks. ? ?Hx Graves disease ?- 5/12 TSH pending ?  ?  Hypokalemia ?-Potassium goal>4 ?-5/10 K-Dur 60 mEq ?-Normal saline+ KCl 20 mEq/L@125ml /hr ? ? ?Hypocalcemia ?-5/11 corrected calcium = 8.0  ?-5/11 calcium gluconate 2 g ?  ?Hypomagnesmia ?- Magnesium goal> 2 ?  ?Class 3 obesity (48.94 kg/m?). ?Lifestyle modifications. ?Follow-up closely with PCP. ? ? ?Mobility Assessment (last 72 hours)   ? ? Mobility Assessment   ? ? [place] Name 07/18/21 0715 07/17/21 2000 07/17/21 [id number] 07/15/21 2249 07/15/21 2241  ? Does patient have an order for bedrest or is patient medically unstable No - Continue assessment No - Continue assessment No - Continue assessment No - Continue assessment No - Continue assessment  ? What is the highest level of mobility based on the progressive mobility assessment? Level 6 (Walks independently in room and  hall) - Balance while walking in room without assist - Complete Level 6 (Walks independently in room  and hall) - Balance while walking in room without assist - Complete Level 5 (Walks with assist in room/hall) - Balance while stepping forward/back and can walk in room with assist - Complete Level 5 (Walks with assist in room/hall) - Balance while stepping forward/back and can walk in room with assist - Complete Level 5 (Walks with assist in room/hall) - Balance while stepping forward/back and can walk in room with assist - Complete  ? ?  ?  ? ?  ? ? ?DVT prophylaxis: Subcu heparin ?Code Status: Full ?Family Communication:  ?Status is: Inpatient ? ? ? ?Dispo: The patient is from: Home ?             Anticipated d/c is to: Home ?             Anticipated d/c date is: > 3 days ?             Patient currently is not medically stable to d/c. ? ? ? ? ? ?Consultants:  ? ? ?Procedures/Significant Events:  ?5/11 KUB: No acute findings ?5/11 CT abdomen pelvis W contrast ?No acute findings in the abdomen or pelvis. Specifically, no ?findings to explain the patient's history of abdominal pain with ?nausea and vomiting. ?  ?Borderline to mild lymphadenopathy in the ileocolic mesentery is ?stable in the 3 month interval since prior study, most suggestive of ?benign/reactive etiology. Follow-up CT in 3-6 months  ? ? ?I have personally reviewed and interpreted all radiology studies and my findings are as above. ? ?VENTILATOR SETTINGS: ? ? ? ?Cultures ? ? ?Antimicrobials: ? ? ? ?Devices ?  ? ?LINES / TUBES:  ? ? ? ? ?Continuous Infusions: ? 0.9 % NaCl with KCl 20 mEq / L 125 mL/hr at 07/18/21 0942  ? promethazine (PHENERGAN) injection (IM or IVPB) 25 mg (07/17/21 1820)  ? ? ? ?Objective: ?Vitals:  ? 07/17/21 0538 07/17/21 1224 07/17/21 2136 07/18/21 0534  ?BP: 100/62 129/88 107/62 116/73  ?Pulse: 86 83 89 69  ?Resp: 16 16 18 18   ?Temp: 97.8 ?F (36.6 ?C) 98.6 ?F (37 ?C) 99 ?F (37.2 ?C) 97.8 ?F (36.6 ?C)  ?TempSrc: Oral  Oral Oral  ?SpO2: 99% 98% 97% 100%  ?Weight:      ?Height:      ? ? ?Intake/Output Summary (Last 24 hours) at  07/18/2021 1201 ?Last data filed at 07/18/2021 0944 ?Gross per 24 hour  ?Intake 2885.07 ml  ?Output 1000 ml  ?Net 1885.07 ml  ? ? ?Filed Weights  ? 07/15/21 0000  ?Weight: (!) 146 kg  ? ? ?Examination: ? ?General: A/O x4, No acute respiratory distress, continued nausea, negative vomiting ?Eyes: negative scleral hemorrhage, negative anisocoria, negative icterus ?ENT: Negative Runny nose, negative gingival bleeding, ?Neck:  Negative scars, masses, torticollis, lymphadenopathy, JVD ?Lungs: Clear to auscultation bilaterally without wheezes or crackles ?Cardiovascular: Regular rate and rhythm without murmur gallop or rub normal S1 and S2 ?Abdomen: MORBIDLY OBESE, minimal positive abdominal pain LLQ, nondistended, negative soft, bowel sounds, no rebound, no ascites, no appreciable mass ?Extremities: No significant cyanosis, clubbing, or edema bilateral lower extremities ?Skin: Negative rashes, lesions, ulcers ?Psychiatric:  Negative depression, negative anxiety, negative fatigue, negative mania  ?Central nervous system:  Cranial nerves II through XII intact, tongue/uvula midline, all extremities muscle strength 5/5, sensation intact throughout, negative dysarthria, negative expressive aphasia, negative receptive aphasia. ? ?.  ? ? ? ?Data  Reviewed: Care during the described time interval was provided by me .  I have reviewed this patient's available data, including medical history, events of note, physical examination, and all test results as part of my evaluation.  ? ?CBC: ?Recent Labs  ?Lab 07/15/21 ?0002 07/15/21 ?0936 07/16/21 ?0434 07/17/21 ?[id number] 07/18/21 ?0411  ?WBC 13.4* 16.2* 10.0 8.5 8.0  ?NEUTROABS  --   --   --  3.8 3.4  ?HGB 14.6 13.3 12.9 12.8 12.1  ?HCT 43.5 41.5 40.0 38.1 36.0  ?MCV 89.9 92.0 92.6 91.1 88.9  ?PLT 404* 373 316 323 331  ? ? ?Basic Metabolic Panel: ?Recent Labs  ?Lab 07/15/21 ?0002 07/15/21 ?0936 07/16/21 ?0434 07/17/21 ?[id number] 07/18/21 ?0411  ?NA 142 145 144 140 137  ?K 3.5 3.3* 3.0* 3.7 3.7  ?CL  106 106 110 107 109  ?CO2 26 29 29 26 24   ?GLUCOSE 135* 123* 113* 83 101*  ?BUN 15 15 11  5* <5*  ?CREATININE 1.04* 0.90 0.72 0.68 0.69  ?CALCIUM 8.9 8.7* 7.9* 7.5* 8.0*  ?MG  --  2.5*  --  2.1 1.8  ?PHOS  --

## 2021-07-18 NOTE — Plan of Care (Signed)

## 2021-07-19 DIAGNOSIS — K3184 Gastroparesis: Secondary | ICD-10-CM | POA: Diagnosis not present

## 2021-07-19 DIAGNOSIS — F310 Bipolar disorder, current episode hypomanic: Secondary | ICD-10-CM | POA: Diagnosis not present

## 2021-07-19 DIAGNOSIS — R112 Nausea with vomiting, unspecified: Secondary | ICD-10-CM | POA: Diagnosis not present

## 2021-07-19 LAB — COMPREHENSIVE METABOLIC PANEL
ALT: 49 U/L — ABNORMAL HIGH (ref 0–44)
AST: 31 U/L (ref 15–41)
Albumin: 3 g/dL — ABNORMAL LOW (ref 3.5–5.0)
Alkaline Phosphatase: 68 U/L (ref 38–126)
Anion gap: 5 (ref 5–15)
BUN: <5 mg/dL — ABNORMAL LOW (ref 6–20)
CO2: 25 mmol/L (ref 22–32)
Calcium: 8.2 mg/dL — ABNORMAL LOW (ref 8.9–10.3)
Chloride: 107 mmol/L (ref 98–111)
Creatinine, Ser: 0.74 mg/dL (ref 0.44–1.00)
GFR, Estimated: >60 mL/min (ref >60)
Glucose, Bld: 130 mg/dL — ABNORMAL HIGH (ref 70–99)
Potassium: 3.8 mmol/L (ref 3.5–5.1)
Sodium: 137 mmol/L (ref 135–145)
Total Bilirubin: 0.6 mg/dL (ref 0.3–1.2)
Total Protein: 5.6 g/dL — ABNORMAL LOW (ref 6.5–8.1)

## 2021-07-19 LAB — CBC WITH DIFFERENTIAL/PLATELET
Abs Immature Granulocytes: 0.04 10*3/uL (ref 0.00–0.07)
Basophils Absolute: 0 10*3/uL (ref 0.0–0.1)
Basophils Relative: 1 %
Eosinophils Absolute: 0.4 10*3/uL (ref 0.0–0.5)
Eosinophils Relative: 5 %
HCT: 37.1 % (ref 36.0–46.0)
Hemoglobin: 12.2 g/dL (ref 12.0–15.0)
Immature Granulocytes: 1 %
Lymphocytes Relative: 42 %
Lymphs Abs: 3.1 10*3/uL (ref 0.7–4.0)
MCH: 30 pg (ref 26.0–34.0)
MCHC: 32.9 g/dL (ref 30.0–36.0)
MCV: 91.4 fL (ref 80.0–100.0)
Monocytes Absolute: 0.6 10*3/uL (ref 0.1–1.0)
Monocytes Relative: 8 %
Neutro Abs: 3.1 10*3/uL (ref 1.7–7.7)
Neutrophils Relative %: 43 %
Platelets: 318 10*3/uL (ref 150–400)
RBC: 4.06 MIL/uL (ref 3.87–5.11)
RDW: 13.5 % (ref 11.5–15.5)
WBC: 7.2 10*3/uL (ref 4.0–10.5)
nRBC: 0 % (ref 0.0–0.2)

## 2021-07-19 LAB — MAGNESIUM: Magnesium: 1.8 mg/dL (ref 1.7–2.4)

## 2021-07-19 LAB — PHOSPHORUS: Phosphorus: 3.7 mg/dL (ref 2.5–4.6)

## 2021-07-19 LAB — TSH: TSH: 0.446 u[IU]/mL (ref 0.350–4.500)

## 2021-07-19 NOTE — Progress Notes (Signed)
?PROGRESS NOTE ? ? ? ?Deborah Kelly  [id number] DOB: 10-27-1990 DOA: 07/14/2021 ?PCP: Pcp, No  ? ?Brief Narrative:  ? 31 y.o. BF ETOHabuse, cannabis misuse, anxiety, depression, bipolar 1 disorder, history of suicide attempt, history of chronic pelvic pain in female, dysmenorrhea, elevated prolactin level, laparoscopic with salpingectomy at age 31, Graves' disease/hyperthyroidism, Gastroparesis (started post laparoscopic surgery), Hx intractable abdominal pain and vomiting, class III obesity with a BMI of 48.94 kg/m?  ? ?Admitted with complaints of periumbilical abdominal pain associated with nausea and multiple episodes of emesis for the past 4 days.  She has also been constipated for the past few days.  No hematemesis or bilious vomiting.  No melena, hematochezia, flank pain, dysuria, frequency or hematuria.  No fever, chills, but has been having a frontal headache.  No sore throat, rhinorrhea, dyspnea, wheezing or hemoptysis.  No chest pain, palpitations, diaphoresis, PND, orthopnea or recent pitting edema lower extremities. ? ?ED course: Initial vital signs were temperature 98 ?F, pulse 57, respirations 16, BP 144/82 mmHg O2 sat 99% on room air.  The patient received 2000 mL of LR bolus hydromorphone 1 mg IVP x3, clonazepam 1 mg p.o. x1 and prochlorperazine 10 mg IVP. ? ? ?Subjective: ?5/13 afebrile overnight A/O x4.  Positive postprandial abdominal pain and nausea.  Consumed only 1 pancake ? ? ?Assessment & Plan: ? Covid vaccination; ? ?Principal Problem: ?  Abdominal pain ?Active Problems: ?  Gastroparesis ?  HTN (hypertension) ?  Bipolar disorder (HCC) ?  Hypokalemia ?  Class 3 obesity (HCC) ?  Hypocalcemia ?  Hypermagnesemia ?  Cannabinoid hyperemesis syndrome ? ?Gastroparesis vs ileus/SBO ?-Patient seen by followed by Dr. Elsie Amisgunwale (gastroenterology), in [place]: 01/13/2016 gastric emptying study revealed mild late-phase Gastroparesis.  ?-Patient has not established GI care since [place]. ?-5/10 KUB negative: Depending upon results may require CT abdomen with oral contrast ?-5/11 CT abdomen with oral contrast: No findings of SBO/ileus see results below  ?-5/11 gastroparesis most likely secondary to cannabinoid hyperemesis syndrome. ?- 5/11 Reglan 10 mg before every meal/nightly ?- 5/11 clear liquid diet ?-5/12 advance diet to bland ?-5/13 decrease diet to full liquid diet given patient's intolerance of bland diet. ? ?Substance Abuse/Cannabinoid Hyperemesis Syndrome ?-5/10 counseled patient that chronic daily cannabis use can cause/exacerbate gastroparesis. ?-Per care everywhere notes [facility]/14/2021, was also counseled on discontinuing cannabis use. ? ?HTN (hypertension) ?-Currently controlled without BP medication ? ?QT prolongation ?- 5/11 EKG NSR, NEGATIVE QT prolongation ?  ?Bipolar disorder (HCC) ?-Continue Seroquel 400 mg at bedtime. ?-Continue haloperidol 10 mg p.o. bedtime. ?-5/11 restart Lamicta 25 mg daily .  Pharmacy titrating up to therapeutic dose of 200 mg over several weeks. ? ?Hx Graves disease ?- 5/13 TSH= 0.446 which is WNL ?  ?  Hypokalemia ?-Potassium goal>4 ?-5/10 K-Dur 60 mEq ?-Normal saline+ KCl 20 mEq/L@125ml /hr ? ? ?Hypocalcemia ?-5/11 corrected calcium = 8.0  ?-5/11 calcium gluconate 2 g ?  ?Hypomagnesmia ?- Magnesium goal> 2 ?  ?Class 3 obesity (48.94 kg/m?). ?Lifestyle modifications. ?Follow-up closely with PCP. ? ? ?Mobility Assessment (last 72 hours)   ? ? Mobility Assessment   ? ? [place] Name 07/18/21 2124 07/18/21 0715 07/17/21 2000 07/17/21 0824  ?  ? Does patient have an order for bedrest or is patient medically unstable No - Continue assessment No - Continue assessment No - Continue assessment No - Continue assessment   ? What is the highest level of mobility based on  the progressive mobility assessment? Level 6 (Walks independently in room and hall) - Balance while walking in room without assist -  Complete Level 6 (Walks independently in room and hall) - Balance while walking in room without assist - Complete Level 6 (Walks independently in room and hall) - Balance while walking in room without assist - Complete Level 5 (Walks with assist in room/hall) - Balance while stepping forward/back and can walk in room with assist - Complete   ? ?  ?  ? ?  ? ? ?DVT prophylaxis: Subcu heparin ?Code Status: Full ?Family Communication:  ?Status is: Inpatient ? ? ? ?Dispo: The patient is from: Home ?             Anticipated d/c is to: Home ?             Anticipated d/c date is: > 3 days ?             Patient currently is not medically stable to d/c. ? ? ? ? ? ?Consultants:  ? ? ?Procedures/Significant Events:  ?5/11 KUB: No acute findings ?5/11 CT abdomen pelvis W contrast ?No acute findings in the abdomen or pelvis. Specifically, no ?findings to explain the patient's history of abdominal pain with ?nausea and vomiting. ?  ?Borderline to mild lymphadenopathy in the ileocolic mesentery is ?stable in the 3 month interval since prior study, most suggestive of ?benign/reactive etiology. Follow-up CT in 3-6 months  ? ? ?I have personally reviewed and interpreted all radiology studies and my findings are as above. ? ?VENTILATOR SETTINGS: ? ? ? ?Cultures ? ? ?Antimicrobials: ? ? ? ?Devices ?  ? ?LINES / TUBES:  ? ? ? ? ?Continuous Infusions: ? 0.9 % NaCl with KCl 20 mEq / L 125 mL/hr at 07/19/21 0939  ? promethazine (PHENERGAN) injection (IM or IVPB) 25 mg (07/18/21 1703)  ? ? ? ?Objective: ?Vitals:  ? 07/18/21 0534 07/18/21 1249 07/18/21 2116 07/19/21 0512  ?BP: 116/73 122/73 117/79 108/64  ?Pulse: 69 78 88 80  ?Resp: 18 16 18 18   ?Temp: 97.8 ?F (36.6 ?C) 98.2 ?F (36.8 ?C) 98.7 ?F (37.1 ?C) 97.6 ?F (36.4 ?C)  ?TempSrc: Oral Oral Oral Oral  ?SpO2: 100% 100% 99% 98%  ?Weight:      ?Height:      ? ? ?Intake/Output Summary (Last 24 hours) at 07/19/2021 1147 ?Last data filed at 07/19/2021 1131 ?Gross per 24 hour  ?Intake 4192.11 ml   ?Output --  ?Net 4192.11 ml  ? ? ?Filed Weights  ? 07/15/21 0000  ?Weight: (!) 146 kg  ? ? ?Examination: ? ?General: A/O x4, No acute respiratory distress, continued nausea, negative vomiting ?Eyes: negative scleral hemorrhage, negative anisocoria, negative icterus ?ENT: Negative Runny nose, negative gingival bleeding, ?Neck:  Negative scars, masses, torticollis, lymphadenopathy, JVD ?Lungs: Clear to auscultation bilaterally without wheezes or crackles ?Cardiovascular: Regular rate and rhythm without murmur gallop or rub normal S1 and S2 ?Abdomen: MORBIDLY OBESE, minimal positive abdominal pain LLQ, nondistended, negative soft, bowel sounds, no rebound, no ascites, no appreciable mass ?Extremities: No significant cyanosis, clubbing, or edema bilateral lower extremities ?Skin: Negative rashes, lesions, ulcers ?Psychiatric:  Negative depression, negative anxiety, negative fatigue, negative mania  ?Central nervous system:  Cranial nerves II through XII intact, tongue/uvula midline, all extremities muscle strength 5/5, sensation intact throughout, negative dysarthria, negative expressive aphasia, negative receptive aphasia. ? ?.  ? ? ? ?Data Reviewed: Care during the described time interval was provided by me .  I have reviewed this patient's available data, including medical history, events of note, physical examination, and all test results as part of my evaluation.  ? ?CBC: ?Recent Labs  ?Lab 07/15/21 ?0936 07/16/21 ?0434 07/17/21 ?0737 07/18/21 ?0411 07/19/21 ?1062  ?WBC 16.2* 10.0 8.5 8.0 7.2  ?NEUTROABS  --   --  3.8 3.4 3.1  ?HGB 13.3 12.9 12.8 12.1 12.2  ?HCT 41.5 40.0 38.1 36.0 37.1  ?MCV 92.0 92.6 91.1 88.9 91.4  ?PLT 373 316 323 331 318  ? ? ?Basic Metabolic Panel: ?Recent Labs  ?Lab 07/15/21 ?0936 07/16/21 ?0434 07/17/21 ?6948 07/18/21 ?0411 07/19/21 ?5462  ?NA 145 144 140 137 137  ?K 3.3* 3.0* 3.7 3.7 3.8  ?CL 106 110 107 109 107  ?CO2 29 29 26 24 25   ?GLUCOSE 123* 113* 83 101* 130*  ?BUN 15 11 5* <5* <5*   ?CREATININE 0.90 0.72 0.68 0.69 0.74  ?CALCIUM 8.7* 7.9* 7.5* 8.0* 8.2*  ?MG 2.5*  --  2.1 1.8 1.8  ?PHOS  --   --  2.6 3.3 3.7  ? ? ?GFR: ?Estimated Creatinine Clearance: 159.8 mL/min (by C-G formula based on SCr of 0.7

## 2021-07-20 ENCOUNTER — Inpatient Hospital Stay (HOSPITAL_COMMUNITY): Payer: No Typology Code available for payment source

## 2021-07-20 DIAGNOSIS — F310 Bipolar disorder, current episode hypomanic: Secondary | ICD-10-CM | POA: Diagnosis not present

## 2021-07-20 DIAGNOSIS — K56609 Unspecified intestinal obstruction, unspecified as to partial versus complete obstruction: Secondary | ICD-10-CM

## 2021-07-20 DIAGNOSIS — R112 Nausea with vomiting, unspecified: Secondary | ICD-10-CM | POA: Diagnosis not present

## 2021-07-20 DIAGNOSIS — K3184 Gastroparesis: Secondary | ICD-10-CM | POA: Diagnosis not present

## 2021-07-20 LAB — CBC WITH DIFFERENTIAL/PLATELET
Abs Immature Granulocytes: 0.06 10*3/uL (ref 0.00–0.07)
Basophils Absolute: 0.1 10*3/uL (ref 0.0–0.1)
Basophils Relative: 1 %
Eosinophils Absolute: 0.4 10*3/uL (ref 0.0–0.5)
Eosinophils Relative: 4 %
HCT: 40.6 % (ref 36.0–46.0)
Hemoglobin: 13.5 g/dL (ref 12.0–15.0)
Immature Granulocytes: 1 %
Lymphocytes Relative: 40 %
Lymphs Abs: 3.7 10*3/uL (ref 0.7–4.0)
MCH: 30.7 pg (ref 26.0–34.0)
MCHC: 33.3 g/dL (ref 30.0–36.0)
MCV: 92.3 fL (ref 80.0–100.0)
Monocytes Absolute: 0.7 10*3/uL (ref 0.1–1.0)
Monocytes Relative: 7 %
Neutro Abs: 4.4 10*3/uL (ref 1.7–7.7)
Neutrophils Relative %: 47 %
Platelets: 333 10*3/uL (ref 150–400)
RBC: 4.4 MIL/uL (ref 3.87–5.11)
RDW: 13.8 % (ref 11.5–15.5)
WBC: 9.3 10*3/uL (ref 4.0–10.5)
nRBC: 0 % (ref 0.0–0.2)

## 2021-07-20 LAB — COMPREHENSIVE METABOLIC PANEL
ALT: 50 U/L — ABNORMAL HIGH (ref 0–44)
AST: 30 U/L (ref 15–41)
Albumin: 3.3 g/dL — ABNORMAL LOW (ref 3.5–5.0)
Alkaline Phosphatase: 74 U/L (ref 38–126)
Anion gap: 7 (ref 5–15)
BUN: <5 mg/dL — ABNORMAL LOW (ref 6–20)
CO2: 27 mmol/L (ref 22–32)
Calcium: 9.1 mg/dL (ref 8.9–10.3)
Chloride: 108 mmol/L (ref 98–111)
Creatinine, Ser: 0.76 mg/dL (ref 0.44–1.00)
GFR, Estimated: >60 mL/min (ref >60)
Glucose, Bld: 101 mg/dL — ABNORMAL HIGH (ref 70–99)
Potassium: 4.3 mmol/L (ref 3.5–5.1)
Sodium: 142 mmol/L (ref 135–145)
Total Bilirubin: 0.5 mg/dL (ref 0.3–1.2)
Total Protein: 6.3 g/dL — ABNORMAL LOW (ref 6.5–8.1)

## 2021-07-20 LAB — MAGNESIUM: Magnesium: 1.8 mg/dL (ref 1.7–2.4)

## 2021-07-20 LAB — PHOSPHORUS: Phosphorus: 4 mg/dL (ref 2.5–4.6)

## 2021-07-20 MED ORDER — METOCLOPRAMIDE HCL 5 MG PO TABS
15.0000 mg | ORAL_TABLET | Freq: Three times a day (TID) | ORAL | Status: DC
Start: 2021-07-20 — End: 2021-07-22
  Administered 2021-07-20 – 2021-07-22 (×9): 15 mg via ORAL
  Filled 2021-07-20 (×9): qty 1

## 2021-07-20 MED ORDER — LORAZEPAM 2 MG/ML IJ SOLN
2.0000 mg | Freq: Once | INTRAMUSCULAR | Status: AC
  Administered 2021-07-20: 2 mg via INTRAVENOUS
  Filled 2021-07-20: qty 1

## 2021-07-20 MED ORDER — DIATRIZOATE MEGLUMINE & SODIUM 66-10 % PO SOLN
90.0000 mL | Freq: Once | ORAL | Status: AC
  Administered 2021-07-20: 90 mL via NASOGASTRIC
  Filled 2021-07-20: qty 90

## 2021-07-20 MED ORDER — PHENOL 1.4 % MT LIQD
1.0000 | OROMUCOSAL | Status: DC | PRN
  Filled 2021-07-20: qty 177

## 2021-07-20 NOTE — Progress Notes (Signed)
?PROGRESS NOTE ? ? ? ?Deborah Kelly  OZH:[id number] DOB: Sep 24, 1990 DOA: 07/14/2021 ?PCP: Pcp, No  ? ?Brief Narrative:  ? 31 y.o. BF ETOHabuse, cannabis misuse, anxiety, depression, bipolar 1 disorder, history of suicide attempt, history of chronic pelvic pain in female, dysmenorrhea, elevated prolactin level, laparoscopic with salpingectomy at age 15, Graves' disease/hyperthyroidism, Gastroparesis (started post laparoscopic surgery), Hx intractable abdominal pain and vomiting, class III obesity with a BMI of 48.94 kg/m?  ? ?Admitted with complaints of periumbilical abdominal pain associated with nausea and multiple episodes of emesis for the past 4 days.  She has also been constipated for the past few days.  No hematemesis or bilious vomiting.  No melena, hematochezia, flank pain, dysuria, frequency or hematuria.  No fever, chills, but has been having a frontal headache.  No sore throat, rhinorrhea, dyspnea, wheezing or hemoptysis.  No chest pain, palpitations, diaphoresis, PND, orthopnea or recent pitting edema lower extremities. ? ?ED course: Initial vital signs were temperature 98 ?F, pulse 57, respirations 16, BP 144/82 mmHg O2 sat 99% on room air.  The patient received 2000 mL of LR bolus hydromorphone 1 mg IVP x3, clonazepam 1 mg p.o. x1 and prochlorperazine 10 mg IVP. ? ? ?Subjective: ?5/14 afebrile overnight.  A/O x4.  Patient states did not order liquid diet last night or this morning, secondary to her postprandial abdominal pain and nausea when she consumed 1 pancake. ? ? ?Assessment & Plan: ? Covid vaccination; ? ?Principal Problem: ?  Abdominal pain ?Active Problems: ?  Gastroparesis ?  HTN (hypertension) ?  Bipolar disorder (HCC) ?  Hypokalemia ?  Class 3 obesity (HCC) ?  Hypocalcemia ?  Hypermagnesemia ?  Cannabinoid hyperemesis syndrome ? ?Gastroparesis vs ileus/SBO ?-Patient seen by followed by Dr. Elsie Amis (gastroenterology), in [place]: 01/13/2016 gastric emptying study revealed mild late-phase  Gastroparesis.  ?-Patient has not established GI care since moving to [place]. ?-5/10 KUB negative: Depending upon results may require CT abdomen with oral contrast ?-5/11 CT abdomen with oral contrast: No findings of SBO/ileus see results below  ?-5/11 gastroparesis most likely secondary to cannabinoid hyperemesis syndrome. ?- 5/11 Reglan 10 mg before every meal/nightly ?- 5/11 clear liquid diet ?-5/12 advance diet to bland ?-5/13 decrease diet to full liquid diet given patient's intolerance of bland diet. ?-5/14 increase Reglan 15 mg qac/qhs (max dose) ?-5/14 initiate SBO protocol.  If Gastrografin unsuccessful in passing will consult GI/surgery ? ?Substance Abuse/Cannabinoid Hyperemesis Syndrome ?-5/10 counseled patient that chronic daily cannabis use can cause/exacerbate gastroparesis. ?-Per care everywhere notes patient admitted to [facility] health inpatient care specialist 03/14/2021, was also counseled on discontinuing cannabis use. ? ?HTN (hypertension) ?-Currently controlled without BP medication ? ?QT prolongation ?- 5/11 EKG NSR, NEGATIVE QT prolongation ?  ?Bipolar disorder (HCC) ?-Continue Seroquel 400 mg at bedtime. ?-Continue haloperidol 10 mg p.o. bedtime. ?-5/11 restart Lamicta 25 mg daily .  Pharmacy titrating up to therapeutic dose of 200 mg over several weeks. ? ?Hx Graves disease ?- 5/13 TSH= 0.446 which is WNL ?  ?  Hypokalemia ?-Potassium goal>4 ?-5/10 K-Dur 60 mEq ?-Normal saline+ KCl 20 mEq/L@125ml /hr ? ?Hypocalcemia ?-5/11 corrected calcium = 8.0  ?-5/11 calcium gluconate 2 g ?  ?Hypomagnesmia ?- Magnesium goal> 2 ?  ?Class 3 obesity (48.94 kg/m?). ?Lifestyle modifications. ?Follow-up closely with PCP. ? ? ?Mobility Assessment (last 72 hours)   ? ? Mobility Assessment   ? ? Row Name 07/20/21 0710 07/19/21 1952 07/18/21 2124 07/18/21 0715 07/17/21 2000  ? Does patient have an order  for bedrest or is patient medically unstable No - Continue assessment No - Continue assessment No - Continue  assessment No - Continue assessment No - Continue assessment  ? What is the highest level of mobility based on the progressive mobility assessment? Level 6 (Walks independently in room and hall) - Balance while walking in room without assist - Complete Level 6 (Walks independently in room and hall) - Balance while walking in room without assist - Complete Level 6 (Walks independently in room and hall) - Balance while walking in room without assist - Complete Level 6 (Walks independently in room and hall) - Balance while walking in room without assist - Complete Level 6 (Walks independently in room and hall) - Balance while walking in room without assist - Complete  ? ?  ?  ? ?  ? ? ?DVT prophylaxis: Subcu heparin ?Code Status: Full ?Family Communication: 5/14 spoke with mother over the phone plan of care explained all questions answered. ?Status is: Inpatient ? ? ? ?Dispo: The patient is from: Home ?             Anticipated d/c is to: Home ?             Anticipated d/c date is: > 3 days ?             Patient currently is not medically stable to d/c. ? ? ? ? ? ?Consultants:  ? ? ?Procedures/Significant Events:  ?5/11 KUB: No acute findings ?5/11 CT abdomen pelvis W contrast ?No acute findings in the abdomen or pelvis. Specifically, no ?findings to explain the patient's history of abdominal pain with ?nausea and vomiting. ?  ?Borderline to mild lymphadenopathy in the ileocolic mesentery is ?stable in the 3 month interval since prior study, most suggestive of ?benign/reactive etiology. Follow-up CT in 3-6 months  ? ? ?I have personally reviewed and interpreted all radiology studies and my findings are as above. ? ?VENTILATOR SETTINGS: ? ? ? ?Cultures ? ? ?Antimicrobials: ? ? ? ?Devices ?  ? ?LINES / TUBES:  ? ? ? ? ?Continuous Infusions: ? 0.9 % NaCl with KCl 20 mEq / L 125 mL/hr at 07/20/21 0240  ? promethazine (PHENERGAN) injection (IM or IVPB) 25 mg (07/20/21 0241)  ? ? ? ?Objective: ?Vitals:  ? 07/19/21 0512  07/19/21 1403 07/19/21 2112 07/20/21 0541  ?BP: 108/64 123/78 108/74 108/66  ?Pulse: 80 68 69 82  ?Resp: 18 16 20 16   ?Temp: 97.6 ?F (36.4 ?C) 98 ?F (36.7 ?C) 98.5 ?F (36.9 ?C) 97.8 ?F (36.6 ?C)  ?TempSrc: Oral Oral Oral Oral  ?SpO2: 98% 99% 97% 98%  ?Weight:      ?Height:      ? ? ?Intake/Output Summary (Last 24 hours) at 07/20/2021 1018 ?Last data filed at 07/20/2021 0600 ?Gross per 24 hour  ?Intake 3981.47 ml  ?Output --  ?Net 3981.47 ml  ? ? ?Filed Weights  ? 07/15/21 0000  ?Weight: (!) 146 kg  ? ? ?Examination: ? ?General: A/O x4, No acute respiratory distress, continued nausea, negative vomiting ?Eyes: negative scleral hemorrhage, negative anisocoria, negative icterus ?ENT: Negative Runny nose, negative gingival bleeding, ?Neck:  Negative scars, masses, torticollis, lymphadenopathy, JVD ?Lungs: Clear to auscultation bilaterally without wheezes or crackles ?Cardiovascular: Regular rate and rhythm without murmur gallop or rub normal S1 and S2 ?Abdomen: MORBIDLY OBESE, positive abdominal pain LLQ, nondistended, negative soft, bowel sounds, no rebound, no ascites, no appreciable mass ?Extremities: No significant cyanosis, clubbing, or edema bilateral lower extremities ?  Skin: Negative rashes, lesions, ulcers ?Psychiatric:  Negative depression, negative anxiety, negative fatigue, negative mania  ?Central nervous system:  Cranial nerves II through XII intact, tongue/uvula midline, all extremities muscle strength 5/5, sensation intact throughout, negative dysarthria, negative expressive aphasia, negative receptive aphasia. ? ?.  ? ? ? ?Data Reviewed: Care during the described time interval was provided by me .  I have reviewed this patient's available data, including medical history, events of note, physical examination, and all test results as part of my evaluation.  ? ?CBC: ?Recent Labs  ?Lab 07/16/21 ?0434 07/17/21 ?[id number] 07/18/21 ?0411 07/19/21 ?[id number] 07/20/21 ?0436  ?WBC 10.0 8.5 8.0 7.2 9.3  ?NEUTROABS  --  3.8 3.4  3.1 4.4  ?HGB 12.9 12.8 12.1 12.2 13.5  ?HCT 40.0 38.1 36.0 37.1 40.6  ?MCV 92.6 91.1 88.9 91.4 92.3  ?PLT 316 323 331 318 333  ? ? ?Basic Metabolic Panel: ?Recent Labs  ?Lab 07/15/21 ?0936 07/16/21 ?0434 05/11/

## 2021-07-20 NOTE — Plan of Care (Signed)
C/o intermittent abdominal pain, managed with current pain regimen. OOB, ambulated hall was with encouragement. NGT to right nare, placement confirmed with auscultation and XR. gastrografin challenge ongoing. Pt very emotional, teary re: NGT discomfort.  ? ?Problem: Education: ?Goal: Knowledge of General Education information will improve ?Description: Including pain rating scale, medication(s)/side effects and non-pharmacologic comfort measures ?07/20/2021 1518 by Gilford Raid, RN ?Outcome: Progressing ?07/20/2021 1518 by Gilford Raid, RN ?Outcome: Progressing ?  ?Problem: Health Behavior/Discharge Planning: ?Goal: Ability to manage health-related needs will improve ?07/20/2021 1518 by Gilford Raid, RN ?Outcome: Progressing ?07/20/2021 1518 by Gilford Raid, RN ?Outcome: Progressing ?  ?Problem: Clinical Measurements: ?Goal: Ability to maintain clinical measurements within normal limits will improve ?07/20/2021 1518 by Gilford Raid, RN ?Outcome: Progressing ?07/20/2021 1518 by Gilford Raid, RN ?Outcome: Progressing ?Goal: Will remain free from infection ?07/20/2021 1518 by Gilford Raid, RN ?Outcome: Progressing ?07/20/2021 1518 by Gilford Raid, RN ?Outcome: Progressing ?Goal: Diagnostic test results will improve ?07/20/2021 1518 by Gilford Raid, RN ?Outcome: Progressing ?07/20/2021 1518 by Gilford Raid, RN ?Outcome: Progressing ?Goal: Respiratory complications will improve ?07/20/2021 1518 by Gilford Raid, RN ?Outcome: Progressing ?07/20/2021 1518 by Gilford Raid, RN ?Outcome: Progressing ?Goal: Cardiovascular complication will be avoided ?07/20/2021 1518 by Gilford Raid, RN ?Outcome: Progressing ?07/20/2021 1518 by Gilford Raid, RN ?Outcome: Progressing ?  ?Problem: Activity: ?Goal: Risk for activity intolerance will decrease ?07/20/2021 1518 by Gilford Raid, RN ?Outcome: Progressing ?07/20/2021 1518 by Gilford Raid, RN ?Outcome: Progressing ?   ?Problem: Nutrition: ?Goal: Adequate nutrition will be maintained ?07/20/2021 1518 by Gilford Raid, RN ?Outcome: Progressing ?07/20/2021 1518 by Gilford Raid, RN ?Outcome: Progressing ?  ?Problem: Coping: ?Goal: Level of anxiety will decrease ?07/20/2021 1518 by Gilford Raid, RN ?Outcome: Progressing ?07/20/2021 1518 by Gilford Raid, RN ?Outcome: Progressing ?  ?Problem: Elimination: ?Goal: Will not experience complications related to bowel motility ?07/20/2021 1518 by Gilford Raid, RN ?Outcome: Progressing ?07/20/2021 1518 by Gilford Raid, RN ?Outcome: Progressing ?Goal: Will not experience complications related to urinary retention ?07/20/2021 1518 by Gilford Raid, RN ?Outcome: Progressing ?07/20/2021 1518 by Gilford Raid, RN ?Outcome: Progressing ?  ?Problem: Pain Managment: ?Goal: General experience of comfort will improve ?07/20/2021 1518 by Gilford Raid, RN ?Outcome: Progressing ?07/20/2021 1518 by Gilford Raid, RN ?Outcome: Progressing ?  ?Problem: Safety: ?Goal: Ability to remain free from injury will improve ?07/20/2021 1518 by Gilford Raid, RN ?Outcome: Progressing ?07/20/2021 1518 by Gilford Raid, RN ?Outcome: Progressing ?  ?Problem: Skin Integrity: ?Goal: Risk for impaired skin integrity will decrease ?07/20/2021 1518 by Gilford Raid, RN ?Outcome: Progressing ?07/20/2021 1518 by Gilford Raid, RN ?Outcome: Progressing ?  ?

## 2021-07-21 DIAGNOSIS — F310 Bipolar disorder, current episode hypomanic: Secondary | ICD-10-CM | POA: Diagnosis not present

## 2021-07-21 DIAGNOSIS — R112 Nausea with vomiting, unspecified: Secondary | ICD-10-CM | POA: Diagnosis not present

## 2021-07-21 DIAGNOSIS — K3184 Gastroparesis: Secondary | ICD-10-CM | POA: Diagnosis not present

## 2021-07-21 LAB — CBC WITH DIFFERENTIAL/PLATELET
Abs Immature Granulocytes: 0.05 10*3/uL (ref 0.00–0.07)
Basophils Absolute: 0 10*3/uL (ref 0.0–0.1)
Basophils Relative: 1 %
Eosinophils Absolute: 0.3 10*3/uL (ref 0.0–0.5)
Eosinophils Relative: 4 %
HCT: 40.2 % (ref 36.0–46.0)
Hemoglobin: 13.2 g/dL (ref 12.0–15.0)
Immature Granulocytes: 1 %
Lymphocytes Relative: 34 %
Lymphs Abs: 2.8 10*3/uL (ref 0.7–4.0)
MCH: 29.8 pg (ref 26.0–34.0)
MCHC: 32.8 g/dL (ref 30.0–36.0)
MCV: 90.7 fL (ref 80.0–100.0)
Monocytes Absolute: 0.8 10*3/uL (ref 0.1–1.0)
Monocytes Relative: 10 %
Neutro Abs: 4.4 10*3/uL (ref 1.7–7.7)
Neutrophils Relative %: 50 %
Platelets: 349 10*3/uL (ref 150–400)
RBC: 4.43 MIL/uL (ref 3.87–5.11)
RDW: 13.8 % (ref 11.5–15.5)
WBC: 8.5 10*3/uL (ref 4.0–10.5)
nRBC: 0 % (ref 0.0–0.2)

## 2021-07-21 LAB — COMPREHENSIVE METABOLIC PANEL
ALT: 71 U/L — ABNORMAL HIGH (ref 0–44)
AST: 56 U/L — ABNORMAL HIGH (ref 15–41)
Albumin: 3.7 g/dL (ref 3.5–5.0)
Alkaline Phosphatase: 83 U/L (ref 38–126)
Anion gap: 8 (ref 5–15)
BUN: <5 mg/dL — ABNORMAL LOW (ref 6–20)
CO2: 26 mmol/L (ref 22–32)
Calcium: 8.9 mg/dL (ref 8.9–10.3)
Chloride: 103 mmol/L (ref 98–111)
Creatinine, Ser: 0.84 mg/dL (ref 0.44–1.00)
GFR, Estimated: >60 mL/min (ref >60)
Glucose, Bld: 93 mg/dL (ref 70–99)
Potassium: 3.8 mmol/L (ref 3.5–5.1)
Sodium: 137 mmol/L (ref 135–145)
Total Bilirubin: 0.6 mg/dL (ref 0.3–1.2)
Total Protein: 6.7 g/dL (ref 6.5–8.1)

## 2021-07-21 LAB — MAGNESIUM: Magnesium: 1.8 mg/dL (ref 1.7–2.4)

## 2021-07-21 LAB — PHOSPHORUS: Phosphorus: 4.9 mg/dL — ABNORMAL HIGH (ref 2.5–4.6)

## 2021-07-21 NOTE — Progress Notes (Signed)
?PROGRESS NOTE ? ? ? ?Deborah Kelly  KJ:[id number] DOB: [birth year] DOA: 07/14/2021 ?PCP: Pcp, No  ? ?Brief Narrative:  ? 31 y.o. BF ETOHabuse, cannabis misuse, anxiety, depression, bipolar 1 disorder, history of suicide attempt, history of chronic pelvic pain in female, dysmenorrhea, elevated prolactin level, laparoscopic with salpingectomy at age 12, Graves' disease/hyperthyroidism, Gastroparesis (started post laparoscopic surgery), Hx intractable abdominal pain and vomiting, class III obesity with a BMI of 48.94 kg/m?  ? ?Admitted with complaints of periumbilical abdominal pain associated with nausea and multiple episodes of emesis for the past 4 days.  She has also been constipated for the past few days.  No hematemesis or bilious vomiting.  No melena, hematochezia, flank pain, dysuria, frequency or hematuria.  No fever, chills, but has been having a frontal headache.  No sore throat, rhinorrhea, dyspnea, wheezing or hemoptysis.  No chest pain, palpitations, diaphoresis, PND, orthopnea or recent pitting edema lower extremities. ? ?ED course: Initial vital signs were temperature 98 ?F, pulse 57, respirations 16, BP 144/82 mmHg O2 sat 99% on room air.  The patient received 2000 mL of LR bolus hydromorphone 1 mg IVP x3, clonazepam 1 mg p.o. x1 and prochlorperazine 10 mg IVP. ? ? ?Subjective: ?5/15 afebrile overnight A/O x4.  Patient very dramatic upon entry into room.  States that when she talks or when she gets emotional NG tube causes her pain. ? ? ?Assessment & Plan: ? Covid vaccination; ? ?Principal Problem: ?  Abdominal pain ?Active Problems: ?  Gastroparesis ?  HTN (hypertension) ?  Bipolar disorder ([place]) ?  Hypokalemia ?  Class 3 obesity (HCC) ?  Hypocalcemia ?  Hypermagnesemia ?  Cannabinoid hyperemesis syndrome ? ?Gastroparesis vs ileus/SBO ?-Patient seen by followed by Dr. Brunilda Payor (gastroenterology), in [place]: 01/13/2016 gastric emptying study revealed mild late-phase Gastroparesis.  ?-Patient has not  established GI care since moving to [place]. ?-5/10 KUB negative: Depending upon results may require CT abdomen with oral contrast ?-5/11 CT abdomen with oral contrast: No findings of SBO/ileus see results below  ?-5/11 gastroparesis most likely secondary to cannabinoid hyperemesis syndrome. ?- 5/11 Reglan 10 mg before every meal/nightly ?- 5/11 clear liquid diet ?-5/12 advance diet to bland ?-5/13 decrease diet to full liquid diet given patient's intolerance of bland diet. ?-5/14 increase Reglan 15 mg qac/qhs (max dose) ?-5/14 initiate SBO protocol.  Negative see results below ?- 5/15 discussed case with Dr. Stacie Glaze GI, will see patient.  Await further recommendation  ? ?Substance Abuse/Cannabinoid Hyperemesis Syndrome ?-5/10 counseled patient that chronic daily cannabis use can cause/exacerbate gastroparesis. ?-Per care everywhere notes patient admitted to [facility] health inpatient care specialist 03/14/2021, was also counseled on discontinuing cannabis use. ? ?HTN (hypertension) ?-Currently controlled without BP medication ? ?QT prolongation ?- 5/11 EKG NSR, NEGATIVE QT prolongation ?  ?Bipolar disorder ([place]) ?-Continue Seroquel 400 mg at bedtime. ?-Continue haloperidol 10 mg p.o. bedtime. ?-5/11 restart Lamicta 25 mg daily .  Pharmacy titrating up to therapeutic dose of 200 mg over several weeks. ? ?Hx Graves disease ?- 5/13 TSH= 0.446 which is WNL ?  ?  Hypokalemia ?-Potassium goal>4 ?-5/10 K-Dur 60 mEq ?- 5/15 decrease Normal saline+ KCl 20 mEq/L@ 4ml/hr ? ?Hypocalcemia ?-5/11 corrected calcium = 8.0  ?-5/11 calcium gluconate 2 g ?  ?Hypomagnesmia ?- Magnesium goal> 2 ?  ?Class 3 obesity (48.94 kg/m?). ?Lifestyle modifications. ?Follow-up closely with PCP. ? ? ?Mobility Assessment (last 72 hours)   ? ? Mobility Assessment   ? ? [place] Name 07/21/21 0815 07/20/21  0710 07/19/21 1952 07/18/21 2124  ?  ? Does patient have an order for bedrest or is patient medically unstable -- No - Continue assessment No -  Continue assessment No - Continue assessment   ? What is the highest level of mobility based on the progressive mobility assessment? Level 6 (Walks independently in room and hall) - Balance while walking in room without assist - Complete Level 6 (Walks independently in room and hall) - Balance while walking in room without assist - Complete Level 6 (Walks independently in room and hall) - Balance while walking in room without assist - Complete Level 6 (Walks independently in room and hall) - Balance while walking in room without assist - Complete   ? ?  ?  ? ?  ? ? ?DVT prophylaxis: Subcu heparin ?Code Status: Full ?Family Communication: 5/14 spoke with mother over the phone plan of care explained all questions answered. ?Status is: Inpatient ? ? ? ?Dispo: The patient is from: Home ?             Anticipated d/c is to: Home ?             Anticipated d/c date is: > 3 days ?             Patient currently is not medically stable to d/c. ? ? ? ? ? ?Consultants:  ? Dr. Stacie Glaze GI ? ? ?Procedures/Significant Events:  ?5/11 KUB: No acute findings ?5/11 CT abdomen pelvis W contrast ?No acute findings in the abdomen or pelvis. Specifically, no ?findings to explain the patient's history of abdominal pain with ?nausea and vomiting. ?  ?Borderline to mild lymphadenopathy in the ileocolic mesentery is ?stable in the 3 month interval since prior study, most suggestive of ?benign/reactive etiology. Follow-up CT in 3-6 months  ?5/14 SBO protocol. ?No evidence of small-bowel obstruction. Contrast administered now ?lies within the colon. ? ?I have personally reviewed and interpreted all radiology studies and my findings are as above. ? ?VENTILATOR SETTINGS: ? ? ? ?Cultures ? ? ?Antimicrobials: ? ? ? ?Devices ?  ? ?LINES / TUBES:  ? ? ? ? ?Continuous Infusions: ? 0.9 % NaCl with KCl 20 mEq / L 125 mL/hr at 07/21/21 0306  ? promethazine (PHENERGAN) injection (IM or IVPB) 25 mg (07/20/21 1843)  ? ? ? ?Objective: ?Vitals:  ? 07/20/21  0541 07/20/21 1412 07/20/21 2205 07/21/21 0542  ?BP: 108/66 118/77 115/78 117/84  ?Pulse: 82 86 (!) 103 95  ?Resp: 16 18 18 18   ?Temp: 97.8 ?F (36.6 ?C)  99.1 ?F (37.3 ?C) 98.6 ?F (37 ?C)  ?TempSrc: Oral  Oral Oral  ?SpO2: 98% 100% 100% 97%  ?Weight:      ?Height:      ? ? ?Intake/Output Summary (Last 24 hours) at 07/21/2021 1105 ?Last data filed at 07/21/2021 0600 ?Gross per 24 hour  ?Intake 2375.99 ml  ?Output --  ?Net 2375.99 ml  ? ? ?Filed Weights  ? 07/15/21 0000  ?Weight: (!) 146 kg  ? ? ?Examination: ? ?General: A/O x4, No acute respiratory distress, continued nausea, negative vomiting ?Eyes: negative scleral hemorrhage, negative anisocoria, negative icterus ?ENT: Negative Runny nose, negative gingival bleeding, ?Neck:  Negative scars, masses, torticollis, lymphadenopathy, JVD ?Lungs: Clear to auscultation bilaterally without wheezes or crackles ?Cardiovascular: Regular rate and rhythm without murmur gallop or rub normal S1 and S2 ?Abdomen: MORBIDLY OBESE, positive abdominal pain LLQ, nondistended, negative soft, bowel sounds, no rebound, no ascites, no appreciable mass ?Extremities: No  significant cyanosis, clubbing, or edema bilateral lower extremities ?Skin: Negative rashes, lesions, ulcers ?Psychiatric:  Negative depression, negative anxiety, negative fatigue, negative mania  ?Central nervous system:  Cranial nerves II through XII intact, tongue/uvula midline, all extremities muscle strength 5/5, sensation intact throughout, negative dysarthria, negative expressive aphasia, negative receptive aphasia. ? ?.  ? ? ? ?Data Reviewed: Care during the described time interval was provided by me .  I have reviewed this patient's available data, including medical history, events of note, physical examination, and all test results as part of my evaluation.  ? ?CBC: ?Recent Labs  ?Lab 07/17/21 ?[id number] 07/18/21 ?0411 07/19/21 ?[id number] 07/20/21 ?[id number] 07/21/21 ?0426  ?WBC 8.5 8.0 7.2 9.3 8.5  ?NEUTROABS 3.8 3.4 3.1 4.4 4.4   ?HGB 12.8 12.1 12.2 13.5 13.2  ?HCT 38.1 36.0 37.1 40.6 40.2  ?MCV 91.1 88.9 91.4 92.3 90.7  ?PLT 323 331 318 333 349  ? ? ?Basic Metabolic Panel: ?Recent Labs  ?Lab 07/17/21 ?[id number] 07/18/21 ?0411 07/19/21

## 2021-07-21 NOTE — Consult Note (Signed)
Referring Provider: TRH ?Primary Care Physician:  Pcp, No ?Primary Gastroenterologist:  Unassigned ? ?Reason for Consultation:  Nausea, vomiting abdominal pain ? ?HPI: Deborah Kelly is a 31 y.o. female with pmhx of ETOHabuse, cannabis misuse, anxiety, depression, bipolar 1 disorder, history of suicide attempt, history of chronic pelvic pain in female, dysmenorrhea, elevated prolactin level, laparoscopic with salpingectomy at age 68, Graves' disease/hyperthyroidism, Gastroparesis (started post laparoscopic surgery), Hx intractable abdominal pain and vomiting, class III obesity with a BMI of 48.94 kg/m?Marland Kitchen  Patient presented to the ED 07/15/2021 with abdominal pain, nausea, vomiting. ? ?Patient reports she has had ongoing cycles of nausea, vomiting, abdominal pain since she turned 25.  Patient notes the cycles last for about a week at a time.  The cycles occur every 3 weeks every 3 months.  Each cycle start with symptoms of abdominal pain after which she will develop hot flash, nausea and vomiting.  After about a week she begins to feel better.  Previously been seen at [facility] in  [place] for these cycles.  She has had over 6 hospitalizations in the last year for her pain.  Reports she had gastric emptying scan when she was 31 years old showing decreased gastric emptying.  She has previously been told her symptoms may be due to her cannabinoid hyperemesis.  She stopped all marijuana use for 2-1/2 years with no relief of symptoms. ?Reports EGD last year with findings of gastritis but nothing to explain nausea, vomiting.  ? ?She has been in and out of medical care due to insurance coverage. ? ?She notes the inciting incident for her cycles of pain was when she had abdominal surgery for removal of her fallopian tube after she developed PID from an IUD.  Complains of chronic pain.  6 months after her surgery that she developed her first cycle of pain.  ? ?Gastric emptying scan 01/13/2016: ?Mild late phase gastroparesis ? ?No  report of EGD on file or care everywhere.  ? ?During this hospitalization patient had negative KUB on 5/10.  CT abdomen pelvis with no findings of SBO or ileus on 5/11.  Patient's diet was regressed due to significant pain with solid foods.  Patient started on SBO protocol and Reglan increased to 15 mg on 5/14.  She reports her abdominal pain is improved.  She is now having diarrhea.  She would like to have NG tube removed as it is very uncomfortable.  No vomiting in 2 days.  ? ?Patient continues to use marijuana. ? ?Past Medical History:  ?Diagnosis Date  ? Alcohol abuse 05/19/2017  ? Anxiety   ? Bipolar 1 disorder (HCC)   ? Bipolar affective disorder, depressed (HCC) 01/20/2012  ? Cannabis misuse 03/15/2021  ? Chronic pelvic pain in female 07/08/2020  ? Depression   ? Dysmenorrhea 07/08/2020  ? Elevated prolactin level 03/15/2021  ? Gastroparesis   ? Graves disease   ? History of suicide attempt 05/18/2017  ? Hyperthyroidism   ? Intractable vomiting 04/27/2021  ? ? ?History reviewed. No pertinent surgical history. ? ?Prior to Admission medications   ?Medication Sig Start Date End Date Taking? Authorizing Provider  ?acetaminophen-codeine (TYLENOL #4) 300-60 MG tablet Take 1 tablet by mouth daily as needed for pain.   Yes [provider]  ?atenolol-chlorthalidone (TENORETIC) 100-25 MG tablet Take 1 tablet by mouth every evening. 09/16/20  Yes [provider]  ?clonazePAM (KLONOPIN) 1 MG tablet Take 1 mg by mouth 2 (two) times daily. 04/17/21  Yes [provider]  ?  haloperidol (HALDOL) 5 MG tablet Take 2 tablets (10 mg total) by mouth at bedtime. 04/29/21  Yes Pokhrel, Laxman, MD  ?lamoTRIgine (LAMICTAL) 200 MG tablet Take 200 mg by mouth daily at 12 noon. 02/11/21  Yes [provider]  ?QUEtiapine (SEROQUEL) 400 MG tablet Take 400 mg by mouth at bedtime.   Yes [provider]  ?omeprazole (PRILOSEC OTC) 20 MG tablet Take 1 tablet by mouth daily as needed (for acid reflux). ?Patient not  taking: Reported on 07/15/2021 08/30/20   [provider]  ?pantoprazole (PROTONIX) 40 MG tablet Take 1 tablet (40 mg total) by mouth daily. ?Patient not taking: Reported on 07/15/2021 04/29/21 04/29/22  Joycelyn Das, MD  ?potassium chloride SA (KLOR-CON M) 20 MEQ tablet Take 1 tablet (20 mEq total) by mouth daily for 10 days. ?Patient not taking: Reported on 07/15/2021 04/29/21 07/15/21  Joycelyn Das, MD  ?promethazine (PHENERGAN) 12.5 MG tablet Take 2 tablets (25 mg total) by mouth every 6 (six) hours as needed for nausea or vomiting. ?Patient not taking: Reported on 07/15/2021 04/29/21   Joycelyn Das, MD  ? ? ?Scheduled Meds: ? haloperidol  10 mg Oral QHS  ? heparin injection (subcutaneous)  5,000 Units Subcutaneous Q8H  ? lamoTRIgine  25 mg Oral Daily  ? Followed by  ? [START ON 07/24/2021] lamoTRIgine  50 mg Oral Daily  ? Followed by  ? [START ON 07/31/2021] lamoTRIgine  100 mg Oral Daily  ? Followed by  ? [START ON 08/07/2021] lamoTRIgine  150 mg Oral Daily  ? Followed by  ? [START ON 08/14/2021] lamoTRIgine  200 mg Oral Daily  ? metoCLOPramide  15 mg Oral TID AC & HS  ? pantoprazole (PROTONIX) IV  40 mg Intravenous Q24H  ? QUEtiapine  400 mg Oral QHS  ? ?Continuous Infusions: ? 0.9 % NaCl with KCl 20 mEq / L 125 mL/hr at 07/21/21 0306  ? promethazine (PHENERGAN) injection (IM or IVPB) 25 mg (07/21/21 1122)  ? ?PRN Meds:.acetaminophen **OR** acetaminophen, bisacodyl, clonazePAM, HYDROmorphone (DILAUDID) injection, milk and molasses, phenol, promethazine (PHENERGAN) injection (IM or IVPB) ? ?Allergies as of 07/14/2021 - Review Complete 04/26/2021  ?Allergen Reaction Noted  ? Amlodipine Hives 11/14/2019  ? Dicyclomine Other (See Comments) 04/17/2016  ? Penicillins  04/25/2021  ? ? ?History reviewed. No pertinent family history. ? ?Social History  ? ?Socioeconomic History  ? Marital status: Single  ?  Spouse name: Not on file  ? Number of children: Not on file  ? Years of education: Not on file  ? Highest education  level: Not on file  ?Occupational History  ? Not on file  ?Tobacco Use  ? Smoking status: Former  ?  Types: Cigarettes  ? Smokeless tobacco: Never  ?Vaping Use  ? Vaping Use: Every day  ? Substances: Nicotine, Flavoring  ?Substance and Sexual Activity  ? Alcohol use: Not Currently  ? Drug use: Yes  ?  Types: Marijuana  ? Sexual activity: Yes  ?Other Topics Concern  ? Not on file  ?Social History Narrative  ? Not on file  ? ?Social Determinants of Health  ? ?Financial Resource Strain: Not on file  ?Food Insecurity: Not on file  ?Transportation Needs: Not on file  ?Physical Activity: Not on file  ?Stress: Not on file  ?Social Connections: Not on file  ?Intimate Partner Violence: Not on file  ? ? ?Review of Systems: Review of Systems  ?Constitutional:  Positive for malaise/fatigue. Negative for chills and fever.  ?HENT:  Negative for hearing loss and tinnitus.   ?Eyes:  Negative for blurred vision and double vision.  ?Respiratory:  Negative for cough and hemoptysis.   ?Cardiovascular:  Negative for chest pain and palpitations.  ?Gastrointestinal:  Positive for abdominal pain, diarrhea, nausea and vomiting. Negative for blood in stool, constipation, heartburn and melena.  ?Genitourinary:  Negative for dysuria and urgency.  ?Musculoskeletal:  Negative for myalgias and neck pain.  ?Skin:  Negative for itching and rash.  ?Neurological:  Positive for weakness. Negative for dizziness and headaches.  ?Endo/Heme/Allergies:  Negative for environmental allergies. Does not bruise/bleed easily.  ?Psychiatric/Behavioral:  Positive for substance abuse. Negative for depression and suicidal ideas.    ? ?Physical Exam:Physical Exam ?Constitutional:   ?   General: She is not in acute distress. ?   Appearance: Normal appearance. She is obese.  ?HENT:  ?   Head: Normocephalic and atraumatic.  ?   Right Ear: External ear normal.  ?   Left Ear: External ear normal.  ?   Nose: Nose normal.  ?   Comments:  NG tube in place ?   Mouth/Throat:   ?   Mouth: Mucous membranes are moist.  ?Eyes:  ?   Pupils: Pupils are equal, round, and reactive to light.  ?Cardiovascular:  ?   Rate and Rhythm: Normal rate and regular rhythm.  ?   Pulses: Normal p

## 2021-07-22 DIAGNOSIS — R1013 Epigastric pain: Secondary | ICD-10-CM | POA: Diagnosis not present

## 2021-07-22 DIAGNOSIS — F3111 Bipolar disorder, current episode manic without psychotic features, mild: Secondary | ICD-10-CM

## 2021-07-22 DIAGNOSIS — R112 Nausea with vomiting, unspecified: Secondary | ICD-10-CM | POA: Diagnosis not present

## 2021-07-22 DIAGNOSIS — K3184 Gastroparesis: Secondary | ICD-10-CM | POA: Diagnosis not present

## 2021-07-22 LAB — COMPREHENSIVE METABOLIC PANEL
ALT: 110 U/L — ABNORMAL HIGH (ref 0–44)
AST: 97 U/L — ABNORMAL HIGH (ref 15–41)
Albumin: 3.4 g/dL — ABNORMAL LOW (ref 3.5–5.0)
Alkaline Phosphatase: 71 U/L (ref 38–126)
Anion gap: 8 (ref 5–15)
BUN: <5 mg/dL — ABNORMAL LOW (ref 6–20)
CO2: 24 mmol/L (ref 22–32)
Calcium: 8.8 mg/dL — ABNORMAL LOW (ref 8.9–10.3)
Chloride: 106 mmol/L (ref 98–111)
Creatinine, Ser: 0.76 mg/dL (ref 0.44–1.00)
GFR, Estimated: >60 mL/min (ref >60)
Glucose, Bld: 81 mg/dL (ref 70–99)
Potassium: 4.1 mmol/L (ref 3.5–5.1)
Sodium: 138 mmol/L (ref 135–145)
Total Bilirubin: 0.7 mg/dL (ref 0.3–1.2)
Total Protein: 6.2 g/dL — ABNORMAL LOW (ref 6.5–8.1)

## 2021-07-22 LAB — CBC WITH DIFFERENTIAL/PLATELET
Abs Immature Granulocytes: 0.03 10*3/uL (ref 0.00–0.07)
Basophils Absolute: 0.1 10*3/uL (ref 0.0–0.1)
Basophils Relative: 1 %
Eosinophils Absolute: 0.3 10*3/uL (ref 0.0–0.5)
Eosinophils Relative: 4 %
HCT: 40 % (ref 36.0–46.0)
Hemoglobin: 13.1 g/dL (ref 12.0–15.0)
Immature Granulocytes: 0 %
Lymphocytes Relative: 30 %
Lymphs Abs: 2.5 10*3/uL (ref 0.7–4.0)
MCH: 30 pg (ref 26.0–34.0)
MCHC: 32.8 g/dL (ref 30.0–36.0)
MCV: 91.7 fL (ref 80.0–100.0)
Monocytes Absolute: 0.9 10*3/uL (ref 0.1–1.0)
Monocytes Relative: 11 %
Neutro Abs: 4.5 10*3/uL (ref 1.7–7.7)
Neutrophils Relative %: 54 %
Platelets: 320 10*3/uL (ref 150–400)
RBC: 4.36 MIL/uL (ref 3.87–5.11)
RDW: 13.9 % (ref 11.5–15.5)
WBC: 8.3 10*3/uL (ref 4.0–10.5)
nRBC: 0 % (ref 0.0–0.2)

## 2021-07-22 LAB — PHOSPHORUS: Phosphorus: 4.6 mg/dL (ref 2.5–4.6)

## 2021-07-22 LAB — MAGNESIUM: Magnesium: 1.8 mg/dL (ref 1.7–2.4)

## 2021-07-22 MED ORDER — PHENOL 1.4 % MT LIQD: 1.0000 | OROMUCOSAL | 0 refills | Status: DC | PRN

## 2021-07-22 MED ORDER — LAMOTRIGINE 25 MG PO TABS: 50.0000 mg | ORAL_TABLET | Freq: Every day | ORAL | 0 refills | Status: DC

## 2021-07-22 MED ORDER — PANTOPRAZOLE SODIUM 40 MG PO TBEC: 40.0000 mg | DELAYED_RELEASE_TABLET | Freq: Every day | ORAL | Status: DC

## 2021-07-22 MED ORDER — LAMOTRIGINE 150 MG PO TABS: 150.0000 mg | ORAL_TABLET | Freq: Every day | ORAL | 0 refills | Status: DC

## 2021-07-22 MED ORDER — LAMOTRIGINE 100 MG PO TABS
100.0000 mg | ORAL_TABLET | Freq: Every day | ORAL | 0 refills | Status: DC
Start: 2021-07-31 — End: 2021-09-02

## 2021-07-22 MED ORDER — LAMOTRIGINE 25 MG PO TABS: 25.0000 mg | ORAL_TABLET | Freq: Every day | ORAL | 0 refills | Status: DC

## 2021-07-22 MED ORDER — LAMOTRIGINE 200 MG PO TABS
200.0000 mg | ORAL_TABLET | Freq: Every day | ORAL | 0 refills | Status: AC
Start: 2021-08-14 — End: ?

## 2021-07-22 MED ORDER — METOCLOPRAMIDE HCL 5 MG PO TABS: 15.0000 mg | ORAL_TABLET | Freq: Three times a day (TID) | ORAL | 0 refills | Status: DC

## 2021-07-22 MED ORDER — BISACODYL 10 MG RE SUPP: 10.0000 mg | Freq: Every day | RECTAL | 0 refills | Status: DC | PRN

## 2021-07-22 NOTE — Discharge Summary (Addendum)
Physician Discharge Summary  ?Deborah Kelly KJ:[id number] DOB: [birth year] DOA: 07/14/2021 ? ?PCP: Pcp, No ? ?Admit date: 07/14/2021 ?Discharge date: 07/22/2021 ? ?Time spent: 35 minutes ? ?Recommendations for Outpatient Follow-up:  ? ?Gastroparesis vs ileus/SBO ?-Patient seen by followed by Dr. Brunilda Payor (gastroenterology), in [place]: 01/13/2016 gastric emptying study revealed mild late-phase Gastroparesis.  ?-Patient has not established GI care since moving to [place]. ?-5/10 KUB negative: Depending upon results may require CT abdomen with oral contrast ?-5/11 CT abdomen with oral contrast: No findings of SBO/ileus see results below  ?-5/11 gastroparesis most likely secondary to cannabinoid hyperemesis syndrome. ?- 5/11 Reglan 10 mg before every meal/nightly ?- 5/11 clear liquid diet ?-5/12 advance diet to bland ?-5/13 decrease diet to full liquid diet given patient's intolerance of bland diet. ?-5/14 increase Reglan 15 mg qac/qhs (max dose) ?-5/14 initiate SBO protocol.  Negative see results below ?- 5/15 discussed case with Dr. Stacie Glaze GI, will see patient.  Await further recommendation  ?-Follow-up with [facility] as needed ? ?Substance Abuse/Cannabinoid Hyperemesis Syndrome ?-5/10 counseled patient that chronic daily cannabis use can cause/exacerbate gastroparesis. ?-Per care everywhere notes patient admitted to [facility] health inpatient care specialist 03/14/2021, was also counseled on discontinuing cannabis use. ?  ?HTN (hypertension) ?-Currently controlled without BP medication ?-PCP to determine when/if to restart HTN medication ?-Follow-up with PCP and 1 to 2 weeks, bipolar DO (titration of Lamictal back to therapeutic level), HTN, gastroparesis. ?  ?QT prolongation ?- 5/11 EKG NSR, NEGATIVE QT prolongation ?  ?Bipolar disorder ([place]) ?-Continue Seroquel 400 mg at bedtime. ?-Continue haloperidol 10 mg p.o. bedtime. ?-5/11 restart Lamicta 25 mg daily .  Pharmacy titrating up to  therapeutic dose of 200 mg over several weeks. ? -Lamictal 25 mg daily x7 days ? -Lamictal 50 mg daily x7 days starting 07/24/2021 ? -Lamictal 100 mg daily x7 days starting 07/31/2021  ? -Lamictal 150 mg daily x7 days starting 08/07/2021 ? -Lamictal 200 mg daily starting 08/14/2021 ?  ?Hx Graves disease ?- 5/13 TSH= 0.446 which is WNL ?  ?  Hypokalemia ?-Potassium goal>4 ?-5/10 K-Dur 60 mEq ?  ?Hypocalcemia ?-5/11 corrected calcium = 8.0  ?-5/11 calcium gluconate 2 g ?  ?Hypomagnesmia ?- Magnesium goal> 2 ?  ?Class 3 obesity (48.94 kg/m?). ?Lifestyle modifications. ?Follow-up closely with PCP. ? ? ? ?Discharge Diagnoses:  ?Principal Problem: ?  Abdominal pain ?Active Problems: ?  Gastroparesis ?  HTN (hypertension) ?  Bipolar disorder ([place]) ?  Hypokalemia ?  Class 3 obesity (HCC) ?  Hypocalcemia ?  Hypermagnesemia ?  Cannabinoid hyperemesis syndrome ? ? ?Discharge Condition: Stable ? ?Diet recommendation: [name] ? ?Filed Weights  ? 07/15/21 0000  ?Weight: (!) 146 kg  ? ? ?History of present illness:  ?31 y.o. BF ETOHabuse, cannabis misuse, anxiety, depression, bipolar 1 disorder, history of suicide attempt, history of chronic pelvic pain in female, dysmenorrhea, elevated prolactin level, laparoscopic with salpingectomy at age 18, Graves' disease/hyperthyroidism, Gastroparesis (started post laparoscopic surgery), Hx intractable abdominal pain and vomiting, class III obesity with a BMI of 48.94 kg/m?  ?  ?Admitted with complaints of periumbilical abdominal pain associated with nausea and multiple episodes of emesis for the past 4 days.  She has also been constipated for the past few days.  No hematemesis or bilious vomiting.  No melena, hematochezia, flank pain, dysuria, frequency or hematuria.  No fever, chills, but has been having a frontal headache.  No sore throat, rhinorrhea, dyspnea, wheezing or hemoptysis.  No chest pain, palpitations,  diaphoresis, PND, orthopnea or recent pitting edema lower extremities. ? ?ED  course: Initial vital signs were temperature 98 ?F, pulse 57, respirations 16, BP 144/82 mmHg O2 sat 99% on room air.  The patient received 2000 mL of LR bolus hydromorphone 1 mg IVP x3, clonazepam 1 mg p.o. x1 and prochlorperazine 10 mg IVP. ? ?Hospital Course:  ?See above ? ?Procedures: ?5/11 KUB: No acute findings ?5/11 CT abdomen pelvis W contrast ?No acute findings in the abdomen or pelvis. Specifically, no ?findings to explain the patient's history of abdominal pain with ?nausea and vomiting. ?  ?Borderline to mild lymphadenopathy in the ileocolic mesentery is ?stable in the 3 month interval since prior study, most suggestive of ?benign/reactive etiology. Follow-up CT in 3-6 months  ?5/14 SBO protocol. ?No evidence of small-bowel obstruction. Contrast administered now ?lies within the colon. ? ? ?Consultations: ?Dr. Stacie Glaze GI ? ? ? ?Discharge Exam: ?Vitals:  ? 07/21/21 0542 07/21/21 1358 07/21/21 2102 07/22/21 0517  ?BP: 117/84 (!) 140/95 133/78 111/73  ?Pulse: 95 87 74 81  ?Resp: 18 17 18 18   ?Temp: 98.6 ?F (37 ?C) 98 ?F (36.7 ?C) 98.6 ?F (37 ?C) 97.8 ?F (36.6 ?C)  ?TempSrc: Oral Oral Oral Oral  ?SpO2: 97% 100% 100% 100%  ?Weight:      ?Height:      ? ? ?General: A/O x4, No acute respiratory distress, continued nausea, negative vomiting ?Eyes: negative scleral hemorrhage, negative anisocoria, negative icterus ?ENT: Negative Runny nose, negative gingival bleeding, ?Neck:  Negative scars, masses, torticollis, lymphadenopathy, JVD ?Lungs: Clear to auscultation bilaterally without wheezes or crackles ?Cardiovascular: Regular rate and rhythm without murmur gallop or rub normal S1 and S2 ?Abdomen: MORBIDLY OBESE, negative abdominal pain, negative soft, bowel sounds, no rebound, no ascites, no appreciable mass ? ?Discharge Instructions ? ? ?Allergies as of 07/22/2021   ? ?   Reactions  ? Penicillins   ? unknown  ? Amlodipine Hives  ? Dicyclomine Other (See Comments)  ? Pt states it makes her hands shake; pt  also states a rash.   ? ?  ? ?  ?Medication List  ?  ? ?STOP taking these medications   ? ?atenolol-chlorthalidone 100-25 MG tablet ?Commonly known as: TENORETIC ?  ?omeprazole 20 MG tablet ?Commonly known as: PRILOSEC OTC ?  ?potassium chloride SA 20 MEQ tablet ?Commonly known as: KLOR-CON M ?  ? ?  ? ?TAKE these medications   ? ?acetaminophen-codeine 300-60 MG tablet ?Commonly known as: TYLENOL #4 ?Take 1 tablet by mouth daily as needed for pain. ?  ?bisacodyl 10 MG suppository ?Commonly known as: DULCOLAX ?Place 1 suppository (10 mg total) rectally daily as needed for moderate constipation. ?  ?clonazePAM 1 MG tablet ?Commonly known as: KLONOPIN ?Take 1 mg by mouth 2 (two) times daily. ?  ?haloperidol 5 MG tablet ?Commonly known as: HALDOL ?Take 2 tablets (10 mg total) by mouth at bedtime. ?  ?lamoTRIgine 25 MG tablet ?Commonly known as: LAMICTAL ?Take 1 tablet (25 mg total) by mouth daily. ?Start taking on: Jul 23, 2021 ?What changed:  ?medication strength ?how much to take ?when to take this ?  ?lamoTRIgine 25 MG tablet ?Commonly known as: LAMICTAL ?Take 2 tablets (50 mg total) by mouth daily. ?Start taking on: Jul 24, 2021 ?What changed: You were already taking a medication with the same name, and this prescription was added. Make sure you understand how and when to take each. ?  ?lamoTRIgine 100 MG tablet ?Commonly known as: LAMICTAL ?  Take 1 tablet (100 mg total) by mouth daily. ?Start taking on: Jul 31, 2021 ?What changed: You were already taking a medication with the same name, and this prescription was added. Make sure you understand how and when to take each. ?  ?lamoTRIgine 150 MG tablet ?Commonly known as: LAMICTAL ?Take 1 tablet (150 mg total) by mouth daily. ?Start taking on: August 07, 2021 ?What changed: You were already taking a medication with the same name, and this prescription was added. Make sure you understand how and when to take each. ?  ?lamoTRIgine 200 MG tablet ?Commonly known as:  LAMICTAL ?Take 1 tablet (200 mg total) by mouth daily. ?Start taking on: August 14, 2021 ?What changed: You were already taking a medication with the same name, and this prescription was added. Make sure you understand how and when

## 2021-07-22 NOTE — Progress Notes (Signed)
[facility] Progress Note ? ?Deborah Kelly 31 y.o. 02/16/1991 ? ?Subjective: ?Patient seen and examined laying in bed.  NG tube was removed.  Patient is tolerating full liquid diet well.  Denies any nausea or vomiting.  Reports her pain is much improved today. ? ?ROS : Review of Systems  ?Gastrointestinal:  Positive for abdominal pain and nausea. Negative for blood in stool, constipation, diarrhea, heartburn, melena and vomiting.  ?Genitourinary:  Negative for dysuria and urgency.  ? ? ? ?Objective: ?Vital signs in last 24 hours: ?Vitals:  ? 07/21/21 2102 07/22/21 0517  ?BP: 133/78 111/73  ?Pulse: 74 81  ?Resp: 18 18  ?Temp: 98.6 ?F (37 ?C) 97.8 ?F (36.6 ?C)  ?SpO2: 100% 100%  ? ? ?Physical Exam: ? ?General:  Alert, cooperative, no distress, appears stated age, obese  ?Head:  Normocephalic, without obvious abnormality, atraumatic  ?Eyes:  Anicteric sclera, EOM's intact  ?Lungs:   Clear to auscultation bilaterally, respirations unlabored  ?Heart:  Regular rate and rhythm, S1, S2 normal  ?Abdomen:   Soft, non-tender, bowel sounds active all four quadrants,  no masses,   ?Extremities: Extremities normal, atraumatic, no  edema  ?Pulses: 2+ and symmetric  ? ? ?Lab Results: ?Recent Labs  ?  07/21/21 ?0426 07/22/21 ?0451  ?NA 137 138  ?K 3.8 4.1  ?CL 103 106  ?CO2 26 24  ?GLUCOSE 93 81  ?BUN <5* <5*  ?CREATININE 0.84 0.76  ?CALCIUM 8.9 8.8*  ?MG 1.8 1.8  ?PHOS 4.9* 4.6  ? ?Recent Labs  ?  07/21/21 ?0426 07/22/21 ?0451  ?AST 56* 97*  ?ALT 71* 110*  ?ALKPHOS 83 71  ?BILITOT 0.6 0.7  ?PROT 6.7 6.2*  ?ALBUMIN 3.7 3.4*  ? ?Recent Labs  ?  07/21/21 ?0426 07/22/21 ?0451  ?WBC 8.5 8.3  ?NEUTROABS 4.4 4.5  ?HGB 13.2 13.1  ?HCT 40.2 40.0  ?MCV 90.7 91.7  ?PLT 349 320  ? ?No results for input(s): LABPROT, INR in the last 72 hours. ? ? ? ?Assessment ?Nausea, vomiting, abdominal pain ? ?Small bowel follow-through 07/21/2021 ?Gastric catheter noted within the stomach contrast within the colon no obstructive changes seen.  No  SBO. ?  ?CT abdomen pelvis with contrast 07/17/2021 ?No acute findings in the abdomen or pelvis. Specifically, no findings to explain the patient's history of abdominal pain with nausea and vomiting. ? ?Possible continued nausea vomiting and abdominal pain due to cannabinoid hyperemesis. Highly recommend patient discontinue marijuana use.  ?   ?Nausea and vomiting possible also worsened with gastroparesis seen on previous gastric emptying scan.  Unknown etiology. ? 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  Patient notes her psychiatrist also thinks her symptoms are related to a recent traumatic event.  She will be following up closely with her psychiatrist. ? ?  ?Plan: ?Continue to advance diet as tolerated.   ?No plan for endoscopic procedure at this time. ?Continue antiemetics and supportive care ?[facility] will sign off ? ?Emmit Alexanders PA-C ?07/22/2021, 11:23 AM ? ?Contact #  [phone]  ?

## 2021-07-22 NOTE — Progress Notes (Signed)
Reviewed written d/c instructions w pt and all questions answered. She verbalized understanding. D/C ambulatory w all belongings in stable condition. 

## 2021-07-24 ENCOUNTER — Inpatient Hospital Stay (HOSPITAL_COMMUNITY)
Admission: EM | Admit: 2021-07-24 | Discharge: 2021-08-01 | DRG: 392 | Disposition: A | Discharge status: Home / Self Care | Payer: No Typology Code available for payment source | Attending: Internal Medicine | Admitting: Internal Medicine

## 2021-07-24 ENCOUNTER — Other Ambulatory Visit: Payer: Self-pay

## 2021-07-24 ENCOUNTER — Encounter (HOSPITAL_COMMUNITY): Payer: Self-pay | Admitting: Oncology

## 2021-07-24 DIAGNOSIS — Z6841 Body Mass Index (BMI) 40.0 and over, adult: Secondary | ICD-10-CM

## 2021-07-24 DIAGNOSIS — F1290 Cannabis use, unspecified, uncomplicated: Secondary | ICD-10-CM | POA: Diagnosis present

## 2021-07-24 DIAGNOSIS — R112 Nausea with vomiting, unspecified: Secondary | ICD-10-CM

## 2021-07-24 DIAGNOSIS — Z9151 Personal history of suicidal behavior: Secondary | ICD-10-CM

## 2021-07-24 DIAGNOSIS — R7401 Elevation of levels of liver transaminase levels: Secondary | ICD-10-CM | POA: Diagnosis present

## 2021-07-24 DIAGNOSIS — F319 Bipolar disorder, unspecified: Secondary | ICD-10-CM | POA: Diagnosis present

## 2021-07-24 DIAGNOSIS — Z818 Family history of other mental and behavioral disorders: Secondary | ICD-10-CM

## 2021-07-24 DIAGNOSIS — R748 Abnormal levels of other serum enzymes: Secondary | ICD-10-CM | POA: Diagnosis present

## 2021-07-24 DIAGNOSIS — E876 Hypokalemia: Secondary | ICD-10-CM | POA: Diagnosis present

## 2021-07-24 DIAGNOSIS — F4310 Post-traumatic stress disorder, unspecified: Secondary | ICD-10-CM | POA: Diagnosis present

## 2021-07-24 DIAGNOSIS — G8929 Other chronic pain: Secondary | ICD-10-CM | POA: Diagnosis present

## 2021-07-24 DIAGNOSIS — K3184 Gastroparesis: Secondary | ICD-10-CM | POA: Diagnosis not present

## 2021-07-24 DIAGNOSIS — Z87891 Personal history of nicotine dependence: Secondary | ICD-10-CM

## 2021-07-24 DIAGNOSIS — I10 Essential (primary) hypertension: Secondary | ICD-10-CM | POA: Diagnosis present

## 2021-07-24 DIAGNOSIS — F1210 Cannabis abuse, uncomplicated: Secondary | ICD-10-CM | POA: Diagnosis present

## 2021-07-24 DIAGNOSIS — E66813 Obesity, class 3: Secondary | ICD-10-CM | POA: Diagnosis present

## 2021-07-24 DIAGNOSIS — F419 Anxiety disorder, unspecified: Secondary | ICD-10-CM | POA: Diagnosis present

## 2021-07-24 DIAGNOSIS — K5900 Constipation, unspecified: Secondary | ICD-10-CM | POA: Diagnosis present

## 2021-07-24 DIAGNOSIS — E0500 Thyrotoxicosis with diffuse goiter without thyrotoxic crisis or storm: Secondary | ICD-10-CM | POA: Diagnosis present

## 2021-07-24 DIAGNOSIS — Z79899 Other long term (current) drug therapy: Secondary | ICD-10-CM

## 2021-07-24 DIAGNOSIS — Z888 Allergy status to other drugs, medicaments and biological substances status: Secondary | ICD-10-CM

## 2021-07-24 DIAGNOSIS — R109 Unspecified abdominal pain: Secondary | ICD-10-CM | POA: Diagnosis present

## 2021-07-24 DIAGNOSIS — Z880 Allergy status to penicillin: Secondary | ICD-10-CM

## 2021-07-24 DIAGNOSIS — R1110 Vomiting, unspecified: Secondary | ICD-10-CM | POA: Diagnosis present

## 2021-07-24 DIAGNOSIS — E869 Volume depletion, unspecified: Secondary | ICD-10-CM | POA: Diagnosis present

## 2021-07-24 DIAGNOSIS — F1010 Alcohol abuse, uncomplicated: Secondary | ICD-10-CM | POA: Diagnosis present

## 2021-07-24 LAB — I-STAT BETA HCG BLOOD, ED (MC, WL, AP ONLY): I-stat hCG, quantitative: <5 m[IU]/mL (ref <5)

## 2021-07-24 LAB — COMPREHENSIVE METABOLIC PANEL
ALT: 90 U/L — ABNORMAL HIGH (ref 0–44)
AST: 45 U/L — ABNORMAL HIGH (ref 15–41)
Albumin: 4.5 g/dL (ref 3.5–5.0)
Alkaline Phosphatase: 87 U/L (ref 38–126)
Anion gap: 10 (ref 5–15)
BUN: 10 mg/dL (ref 6–20)
CO2: 23 mmol/L (ref 22–32)
Calcium: 9.7 mg/dL (ref 8.9–10.3)
Chloride: 110 mmol/L (ref 98–111)
Creatinine, Ser: 1.12 mg/dL — ABNORMAL HIGH (ref 0.44–1.00)
GFR, Estimated: >60 mL/min (ref >60)
Glucose, Bld: 156 mg/dL — ABNORMAL HIGH (ref 70–99)
Potassium: 3.9 mmol/L (ref 3.5–5.1)
Sodium: 143 mmol/L (ref 135–145)
Total Bilirubin: 0.6 mg/dL (ref 0.3–1.2)
Total Protein: 8.6 g/dL — ABNORMAL HIGH (ref 6.5–8.1)

## 2021-07-24 LAB — URINALYSIS, ROUTINE W REFLEX MICROSCOPIC
Bacteria, UA: NONE SEEN
Bilirubin Urine: NEGATIVE
Glucose, UA: NEGATIVE mg/dL
Hgb urine dipstick: NEGATIVE
Ketones, ur: 20 mg/dL — AB
Leukocytes,Ua: NEGATIVE
Nitrite: NEGATIVE
Protein, ur: 30 mg/dL — AB
Specific Gravity, Urine: 1.023 (ref 1.005–1.030)
pH: 5 (ref 5.0–8.0)

## 2021-07-24 LAB — CBC
HCT: 47 % — ABNORMAL HIGH (ref 36.0–46.0)
Hemoglobin: 15.4 g/dL — ABNORMAL HIGH (ref 12.0–15.0)
MCH: 29.6 pg (ref 26.0–34.0)
MCHC: 32.8 g/dL (ref 30.0–36.0)
MCV: 90.4 fL (ref 80.0–100.0)
Platelets: 424 10*3/uL — ABNORMAL HIGH (ref 150–400)
RBC: 5.2 MIL/uL — ABNORMAL HIGH (ref 3.87–5.11)
RDW: 13.8 % (ref 11.5–15.5)
WBC: 14.6 10*3/uL — ABNORMAL HIGH (ref 4.0–10.5)
nRBC: 0 % (ref 0.0–0.2)

## 2021-07-24 LAB — LIPASE, BLOOD: Lipase: 26 U/L (ref 11–51)

## 2021-07-24 MED ORDER — METOCLOPRAMIDE HCL 5 MG/ML IJ SOLN
10.0000 mg | Freq: Once | INTRAMUSCULAR | Status: AC
  Administered 2021-07-25: 10 mg via INTRAVENOUS
  Filled 2021-07-24: qty 2

## 2021-07-24 MED ORDER — PANTOPRAZOLE SODIUM 40 MG IV SOLR
40.0000 mg | Freq: Once | INTRAVENOUS | Status: AC
  Administered 2021-07-25: 40 mg via INTRAVENOUS
  Filled 2021-07-24: qty 10

## 2021-07-24 MED ORDER — MORPHINE SULFATE (PF) 4 MG/ML IV SOLN
4.0000 mg | Freq: Once | INTRAVENOUS | Status: AC
  Administered 2021-07-25: 4 mg via INTRAVENOUS
  Filled 2021-07-24: qty 1

## 2021-07-24 MED ORDER — SODIUM CHLORIDE 0.9 % IV BOLUS
1000.0000 mL | Freq: Once | INTRAVENOUS | Status: AC
  Administered 2021-07-25: 1000 mL via INTRAVENOUS

## 2021-07-24 NOTE — ED Triage Notes (Signed)
Pt c/o abdominal pain N/V.

## 2021-07-24 NOTE — ED Provider Notes (Signed)
[facility] DEPT Provider Note   CSN: [id number] Arrival date & time: 07/24/21  1839     History  Chief Complaint  Patient presents with   Abdominal Pain    Deborah Kelly is a 31 y.o. female with PMHx gastroparesis and cannabis abuse who presents to the ED today with complaint of ongoing diffuse abdominal pain, nausea, and NBNB emesis. Per chart review pt recently admitted to the hospital from 05/09 - 05/16. CT scan negative for acute process. She was seen by GI and discharged home with PPI and prn antiemetics and advised to follow up in the outpatient setting as needed. Pt reports she was not aware she was prescribed meds and has not been taking anything for her symptoms. She states she has continued to have symptoms from the time of discharge. She states she is unsure if this feels similar to gastroparesis flare however. She reports she has not smoked marijuana since being admitted on 05/09; there was concern for [facility] during admission.   The history is provided by the patient and medical records.      Home Medications Prior to Admission medications   Medication Sig Start Date End Date Taking? Authorizing Provider  acetaminophen-codeine (TYLENOL #4) 300-60 MG tablet Take 1 tablet by mouth daily as needed for pain.    [provider]  bisacodyl (DULCOLAX) 10 MG suppository Place 1 suppository (10 mg total) rectally daily as needed for moderate constipation. 07/22/21   Allie Bossier, MD  clonazePAM (KLONOPIN) 1 MG tablet Take 1 mg by mouth 2 (two) times daily. 04/17/21   [provider]  haloperidol (HALDOL) 5 MG tablet Take 2 tablets (10 mg total) by mouth at bedtime. 04/29/21   Pokhrel, Corrie Mckusick, MD  lamoTRIgine (LAMICTAL) 100 MG tablet Take 1 tablet (100 mg total) by mouth daily. 07/31/21   Allie Bossier, MD  lamoTRIgine (LAMICTAL) 150 MG tablet Take 1 tablet (150 mg total) by mouth daily. 08/07/21   Allie Bossier, MD  lamoTRIgine (LAMICTAL)  200 MG tablet Take 1 tablet (200 mg total) by mouth daily. 08/14/21   Allie Bossier, MD  lamoTRIgine (LAMICTAL) 25 MG tablet Take 1 tablet (25 mg total) by mouth daily. 07/23/21   Allie Bossier, MD  lamoTRIgine (LAMICTAL) 25 MG tablet Take 2 tablets (50 mg total) by mouth daily. 07/24/21   Allie Bossier, MD  metoCLOPramide (REGLAN) 5 MG tablet Take 3 tablets (15 mg total) by mouth 4 (four) times daily -  before meals and at bedtime. 07/22/21   Allie Bossier, MD  pantoprazole (PROTONIX) 40 MG tablet Take 1 tablet (40 mg total) by mouth daily. Patient not taking: Reported on 07/15/2021 04/29/21 04/29/22  Pokhrel, Corrie Mckusick, MD  phenol (CHLORASEPTIC) 1.4 % LIQD Use as directed 1 spray in the mouth or throat as needed for throat irritation / pain. 07/22/21   Allie Bossier, MD  promethazine (PHENERGAN) 12.5 MG tablet Take 2 tablets (25 mg total) by mouth every 6 (six) hours as needed for nausea or vomiting. Patient not taking: Reported on 07/15/2021 04/29/21   Flora Lipps, MD  QUEtiapine (SEROQUEL) 400 MG tablet Take 400 mg by mouth at bedtime.    [provider]      Allergies    Penicillins, Amlodipine, and Dicyclomine    Review of Systems   Review of Systems  Constitutional:  Negative for chills and fever.  Gastrointestinal:  Positive for abdominal pain, nausea and vomiting. Negative for diarrhea.  Genitourinary:  Negative for dysuria and flank pain.  All other systems reviewed and are negative.  Physical Exam Updated Vital Signs BP (!) 150/112 (BP Location: Left Arm)   Pulse 60   Temp 98.1 F (36.7 C) (Oral)   Resp 16   Ht 5\' 8"  (1.727 m)   Wt 136.1 kg   LMP 07/15/2021   SpO2 96%   BMI 45.61 kg/m  Physical Exam Vitals and nursing note reviewed.  Constitutional:      Appearance: She is not ill-appearing.     Comments: Actively vomiting  HENT:     Head: Normocephalic and atraumatic.  Eyes:     Conjunctiva/sclera: Conjunctivae normal.  Cardiovascular:     Rate and  Rhythm: Normal rate and regular rhythm.     Heart sounds: Normal heart sounds.  Pulmonary:     Effort: Pulmonary effort is normal.     Breath sounds: Normal breath sounds. No wheezing, rhonchi or rales.  Abdominal:     Palpations: Abdomen is soft.     Tenderness: There is generalized abdominal tenderness. There is no guarding or rebound.  Musculoskeletal:     Cervical back: Neck supple.  Skin:    General: Skin is warm and dry.  Neurological:     Mental Status: She is alert.    ED Results / Procedures / Treatments   Labs (all labs ordered are listed, but only abnormal results are displayed) Labs Reviewed  COMPREHENSIVE METABOLIC PANEL - Abnormal; Notable for the following components:      Result Value   Glucose, Bld 156 (*)    Creatinine, Ser 1.12 (*)    Total Protein 8.6 (*)    AST 45 (*)    ALT 90 (*)    All other components within normal limits  CBC - Abnormal; Notable for the following components:   WBC 14.6 (*)    RBC 5.20 (*)    Hemoglobin 15.4 (*)    HCT 47.0 (*)    Platelets 424 (*)    All other components within normal limits  URINALYSIS, ROUTINE W REFLEX MICROSCOPIC - Abnormal; Notable for the following components:   APPearance HAZY (*)    Ketones, ur 20 (*)    Protein, ur 30 (*)    All other components within normal limits  LIPASE, BLOOD  I-STAT BETA HCG BLOOD, ED ([facility], [facility], [facility] ONLY)    EKG EKG Interpretation  Date/Time:  Friday Jul 25 2021 00:04:21 EDT Ventricular Rate:  84 PR Interval:  196 QRS Duration: 122 QT Interval:  390 QTC Calculation: 461 R Axis:   26 Text Interpretation: Sinus rhythm Left atrial enlargement Right bundle branch block Nonspecific T wave abnormality Confirmed by Veryl Speak [phone]) on 07/25/2021 12:37:51 AM  Radiology CT Abdomen Pelvis W Contrast  Result Date: 07/25/2021 CLINICAL DATA:  Abdominal pain with elevated white blood cell count EXAM: CT ABDOMEN AND PELVIS WITH CONTRAST TECHNIQUE: Multidetector CT imaging of the  abdomen and pelvis was performed using the standard protocol following bolus administration of intravenous contrast. RADIATION DOSE REDUCTION: This exam was performed according to the departmental dose-optimization program which includes automated exposure control, adjustment of the mA and/or kV according to patient size and/or use of iterative reconstruction technique. CONTRAST:  198mL OMNIPAQUE IOHEXOL 300 MG/ML  SOLN COMPARISON:  CT from 07/17/2021, plain film from 07/21/2021 FINDINGS: Lower chest: No acute abnormality. Hepatobiliary: No focal liver abnormality is seen. No gallstones, gallbladder wall thickening, or biliary dilatation. Pancreas: Unremarkable. No pancreatic ductal dilatation  or surrounding inflammatory changes. Spleen: Normal in size without focal abnormality. Adrenals/Urinary Tract: Adrenal glands are within normal limits. Kidneys demonstrate a normal enhancement pattern bilaterally. No renal calculi or obstructive changes are seen. The bladder is partially distended. Stomach/Bowel: Colon is predominately decompressed. The appendix is within normal limits. No obstructive changes are seen. The small bowel shows no dilatation. Stomach is within normal limits. Vascular/Lymphatic: No significant atherosclerotic changes are noted. A few small right lower quadrant lymph nodes are seen slightly smaller than that noted on the prior exam. No new lymphadenopathy is noted. Reproductive: Status post hysterectomy. No adnexal masses. Other: No abdominal wall hernia or abnormality. No abdominopelvic ascites. Musculoskeletal: No acute or significant osseous findings. IMPRESSION: No evidence of small-bowel obstruction. Scattered small right lower quadrant lymph nodes are seen slightly decreased in size when compared with the prior exam. Electronically Signed   By: Inez Catalina M.D.   On: 07/25/2021 02:44    Procedures Procedures    Medications Ordered in ED Medications  sodium chloride (PF) 0.9 %  injection (has no administration in time range)  sodium chloride 0.9 % bolus 1,000 mL (0 mLs Intravenous Stopped 07/25/21 0342)  metoCLOPramide (REGLAN) injection 10 mg (10 mg Intravenous Given 07/25/21 0149)  morphine (PF) 4 MG/ML injection 4 mg (4 mg Intravenous Given 07/25/21 0136)  pantoprazole (PROTONIX) injection 40 mg (40 mg Intravenous Given 07/25/21 0129)  iohexol (OMNIPAQUE) 300 MG/ML solution 100 mL (100 mLs Intravenous Contrast Given 07/25/21 0225)  droperidol (INAPSINE) 2.5 MG/ML injection 1.25 mg (1.25 mg Intravenous Given 07/25/21 0403)    ED Course/ Medical Decision Making/ A&P                           Medical Decision Making 31 year old female who presents to the ED today with recurrent complaints of abdominal pain, nausea, vomiting with recent hospital admission from 05/09 to 05/16.  Was prescribed PPI and as needed antiemetics which she has not picked up.  On arrival to the ED today vitals are stable.  On exam patient is currently actively vomiting.  She appears uncomfortable.  She has diffuse abdominal pain without rebound or guarding.  Does appear she recently had a CT scan while in the hospital without acute findings.  Patient was medically screened in triage today and work-up started including labs, see below.  CBC with a leukocytosis today of 14,600 without left shift.  Red blood cells, hemoglobin, hematocrit also elevated suggesting dehydration/hemoconcentration.  Will provide fluids at this time.  Question elevated white blood cell count due to same versus infection.  Again vitals are unremarkable however we will plan for repeat CT scan today. CMP with a stable creatinine of 1.12 and BUN of 10.  AST and ALT slightly elevated 45-90 however improved from previous.  No other electrolyte abnormalities.  Potassium within normal limits at 3.9. Lipase within normal limits at 26. Urinalysis without signs of infection.  We will provide fluids, antiemetics, PPI, pain medication with  plan for CT and reevaluation.  EKG obtained as patient may benefit from droperidol however we will plan for QTc check prior to providing  CT scan: IMPRESSION:  No evidence of small-bowel obstruction.   Scattered small right lower quadrant lymph nodes are seen slightly  decreased in size when compared with the prior exam.   On reevaluation pt reports continued symptoms. EKG with QTC 460. Will provide 1.25 mg Droperidol and reassess.   Attempted fluid challenge ~ 1 hour  after droperidol however pt unfortunately vomited. Will plan for admission at this time.   Discussed case with [facility] Dr. Myna Hidalgo who agrees to evaluate patient for admission. Appreciate his involvement.    Problems Addressed: Gastroparesis: acute illness or injury Intractable nausea and vomiting: acute illness or injury  Amount and/or Complexity of Data Reviewed Labs: ordered. Radiology: ordered.  Risk Prescription drug management. Decision regarding hospitalization.          Final Clinical Impression(s) / ED Diagnoses Final diagnoses:  Gastroparesis  Intractable nausea and vomiting    Rx / DC Orders ED Discharge Orders     None         Eustaquio Maize, PA-C 07/25/21 [id number]    Veryl Speak, MD 07/26/21 [id number]

## 2021-07-25 ENCOUNTER — Emergency Department (HOSPITAL_COMMUNITY): Payer: No Typology Code available for payment source

## 2021-07-25 ENCOUNTER — Encounter (HOSPITAL_COMMUNITY): Payer: Self-pay

## 2021-07-25 DIAGNOSIS — R1110 Vomiting, unspecified: Secondary | ICD-10-CM

## 2021-07-25 DIAGNOSIS — R748 Abnormal levels of other serum enzymes: Secondary | ICD-10-CM | POA: Diagnosis present

## 2021-07-25 DIAGNOSIS — R7401 Elevation of levels of liver transaminase levels: Secondary | ICD-10-CM | POA: Diagnosis present

## 2021-07-25 LAB — COMPREHENSIVE METABOLIC PANEL
ALT: 74 U/L — ABNORMAL HIGH (ref 0–44)
AST: 33 U/L (ref 15–41)
Albumin: 4.5 g/dL (ref 3.5–5.0)
Alkaline Phosphatase: 82 U/L (ref 38–126)
Anion gap: 11 (ref 5–15)
BUN: 10 mg/dL (ref 6–20)
CO2: 27 mmol/L (ref 22–32)
Calcium: 9.3 mg/dL (ref 8.9–10.3)
Chloride: 107 mmol/L (ref 98–111)
Creatinine, Ser: 0.99 mg/dL (ref 0.44–1.00)
GFR, Estimated: >60 mL/min (ref >60)
Glucose, Bld: 131 mg/dL — ABNORMAL HIGH (ref 70–99)
Potassium: 3.4 mmol/L — ABNORMAL LOW (ref 3.5–5.1)
Sodium: 145 mmol/L (ref 135–145)
Total Bilirubin: 0.6 mg/dL (ref 0.3–1.2)
Total Protein: 8 g/dL (ref 6.5–8.1)

## 2021-07-25 LAB — CBC
HCT: 43.5 % (ref 36.0–46.0)
Hemoglobin: 14.8 g/dL (ref 12.0–15.0)
MCH: 30.6 pg (ref 26.0–34.0)
MCHC: 34 g/dL (ref 30.0–36.0)
MCV: 90.1 fL (ref 80.0–100.0)
Platelets: 383 10*3/uL (ref 150–400)
RBC: 4.83 MIL/uL (ref 3.87–5.11)
RDW: 14.1 % (ref 11.5–15.5)
WBC: 16.2 10*3/uL — ABNORMAL HIGH (ref 4.0–10.5)
nRBC: 0 % (ref 0.0–0.2)

## 2021-07-25 LAB — MAGNESIUM: Magnesium: 1.9 mg/dL (ref 1.7–2.4)

## 2021-07-25 LAB — PHOSPHORUS: Phosphorus: 3.8 mg/dL (ref 2.5–4.6)

## 2021-07-25 MED ORDER — POTASSIUM CHLORIDE IN NACL 20-0.45 MEQ/L-% IV SOLN
INTRAVENOUS | Status: DC
  Filled 2021-07-25 (×16): qty 1000

## 2021-07-25 MED ORDER — METOPROLOL TARTRATE 5 MG/5ML IV SOLN
5.0000 mg | Freq: Three times a day (TID) | INTRAVENOUS | Status: DC
Start: 2021-07-25 — End: 2021-07-25

## 2021-07-25 MED ORDER — METOPROLOL TARTRATE 5 MG/5ML IV SOLN
5.0000 mg | Freq: Three times a day (TID) | INTRAVENOUS | Status: DC
Start: 2021-07-25 — End: 2021-07-30
  Administered 2021-07-25 – 2021-07-26 (×3): 5 mg via INTRAVENOUS
  Administered 2021-07-26: 2.5 mg via INTRAVENOUS
  Administered 2021-07-27 – 2021-07-30 (×10): 5 mg via INTRAVENOUS
  Filled 2021-07-25 (×16): qty 5

## 2021-07-25 MED ORDER — SODIUM CHLORIDE 0.9 % IV SOLN
25.0000 mg | Freq: Four times a day (QID) | INTRAVENOUS | Status: DC | PRN
  Administered 2021-07-25 – 2021-08-01 (×23): 25 mg via INTRAVENOUS
  Filled 2021-07-25 (×9): qty 25
  Filled 2021-07-25 (×2): qty 1
  Filled 2021-07-25 (×9): qty 25
  Filled 2021-07-25: qty 1
  Filled 2021-07-25 (×2): qty 25

## 2021-07-25 MED ORDER — MAGNESIUM SULFATE 2 GM/50ML IV SOLN
2.0000 g | Freq: Once | INTRAVENOUS | Status: AC
  Administered 2021-07-25: 2 g via INTRAVENOUS
  Filled 2021-07-25: qty 50

## 2021-07-25 MED ORDER — HYDROMORPHONE HCL 1 MG/ML IJ SOLN
1.0000 mg | INTRAMUSCULAR | Status: DC | PRN
  Administered 2021-07-25 – 2021-07-30 (×37): 1 mg via INTRAVENOUS
  Filled 2021-07-25 (×37): qty 1

## 2021-07-25 MED ORDER — ONDANSETRON HCL 4 MG/2ML IJ SOLN: 4.0000 mg | Freq: Four times a day (QID) | INTRAMUSCULAR | Status: DC | PRN

## 2021-07-25 MED ORDER — IOHEXOL 300 MG/ML  SOLN
100.0000 mL | Freq: Once | INTRAMUSCULAR | Status: AC | PRN
  Administered 2021-07-25: 100 mL via INTRAVENOUS

## 2021-07-25 MED ORDER — QUETIAPINE FUMARATE 100 MG PO TABS
100.0000 mg | ORAL_TABLET | Freq: Every morning | ORAL | Status: DC
  Administered 2021-07-26 – 2021-08-01 (×7): 100 mg via ORAL
  Filled 2021-07-25 (×7): qty 1

## 2021-07-25 MED ORDER — LORAZEPAM 2 MG/ML IJ SOLN
1.0000 mg | Freq: Two times a day (BID) | INTRAMUSCULAR | Status: DC | PRN
  Administered 2021-07-25 – 2021-08-01 (×13): 1 mg via INTRAVENOUS
  Filled 2021-07-25 (×13): qty 1

## 2021-07-25 MED ORDER — SODIUM CHLORIDE (PF) 0.9 % IJ SOLN
INTRAMUSCULAR | Status: AC
  Filled 2021-07-25: qty 50

## 2021-07-25 MED ORDER — METOCLOPRAMIDE HCL 5 MG/ML IJ SOLN
10.0000 mg | Freq: Four times a day (QID) | INTRAMUSCULAR | Status: AC
  Administered 2021-07-25 – 2021-07-30 (×21): 10 mg via INTRAVENOUS
  Filled 2021-07-25 (×21): qty 2

## 2021-07-25 MED ORDER — DROPERIDOL 2.5 MG/ML IJ SOLN
1.2500 mg | Freq: Once | INTRAMUSCULAR | Status: AC
  Administered 2021-07-25: 1.25 mg via INTRAVENOUS
  Filled 2021-07-25: qty 2

## 2021-07-25 MED ORDER — POTASSIUM CHLORIDE 10 MEQ/100ML IV SOLN
10.0000 meq | INTRAVENOUS | Status: AC
  Administered 2021-07-25 (×3): 10 meq via INTRAVENOUS
  Filled 2021-07-25 (×3): qty 100

## 2021-07-25 MED ORDER — SODIUM CHLORIDE 0.45 % IV BOLUS
1000.0000 mL | Freq: Once | INTRAVENOUS | Status: AC
  Administered 2021-07-25: 1000 mL via INTRAVENOUS

## 2021-07-25 MED ORDER — MORPHINE SULFATE (PF) 2 MG/ML IV SOLN
2.0000 mg | INTRAVENOUS | Status: DC | PRN
  Administered 2021-07-25 (×2): 2 mg via INTRAVENOUS
  Filled 2021-07-25 (×2): qty 1

## 2021-07-25 MED ORDER — QUETIAPINE FUMARATE 200 MG PO TABS
300.0000 mg | ORAL_TABLET | Freq: Every day | ORAL | Status: DC
  Administered 2021-07-25 – 2021-07-31 (×7): 300 mg via ORAL
  Filled 2021-07-25 (×8): qty 1

## 2021-07-25 MED ORDER — ONDANSETRON HCL 4 MG PO TABS: 4.0000 mg | ORAL_TABLET | Freq: Four times a day (QID) | ORAL | Status: DC | PRN

## 2021-07-25 MED ORDER — PANTOPRAZOLE SODIUM 40 MG IV SOLR
40.0000 mg | INTRAVENOUS | Status: DC
  Administered 2021-07-25 – 2021-07-30 (×6): 40 mg via INTRAVENOUS
  Filled 2021-07-25 (×6): qty 10

## 2021-07-25 MED ORDER — SODIUM CHLORIDE 0.45 % IV SOLN: INTRAVENOUS | Status: DC

## 2021-07-25 NOTE — H&P (Signed)
History and Physical    Patient: Deborah Kelly [id number] DOB: 05/22/1990 DOA: 07/24/2021 DOS: the patient was seen and examined on 07/25/2021 PCP: Pcp, No  Patient coming from: Home  Chief Complaint:  Chief Complaint  Patient presents with   Abdominal Pain   HPI: Deborah Kelly is a 31 y.o. female with medical history significant of alcohol abuse, anxiety, depression, history of suicide attempt, bipolar 1 disorder, cannabis hyperemesis, dysmenorrhea, elevated prolactin level, Graves' disease, gastroparesis who is coming with abdominal pain, nausea and multiple episodes of emesis and the last 2 days.  She has been constipated for the past 3 days.  No diarrhea, melena or hematochezia.  No flank pain, dysuria, frequency or hematuria.  She was recently admitted about 10 days ago for similar complaints.  ED course: Initial vital signs were temperature 98.1 F, pulse 60, respirations 16, BP 150/112 mmHg O2 sat 96% on room air.  The patient received 1000 mL of NS bolus, metoclopramide 10 mg IVP morphine sulfate 4 mg IVP, pantoprazole 40 mg IVP and 1.25 mg of droperidol IV.  Lab work: Her urinalysis was hazy with ketonuria 20 and proteinuria 30 mg/dL.  Lipase was normal.  CBC with a white count of 14.6, hemoglobin 15.4 g/dL platelets 424.  CMP showed normal electrolytes.  Glucose 156 and creatinine 1.12 mg/dL.  Total protein 8.6 g/dL.  AST 45 and ALT 98 units/L.  Imaging: CT abdomen/pelvis with contrast with no evidence for bowel obstruction.  Slightly decrease in size of small right lower quadrant lymph nodes when compared to previous imaging.   Review of Systems: As mentioned in the history of present illness. All other systems reviewed and are negative. 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 No pertinent surgical history. Social History:  reports that she has quit smoking. Her smoking use included cigarettes. She has never used smokeless tobacco. She reports that she does not currently use alcohol. She reports current drug use. Drug: Marijuana.  Allergies  Allergen Reactions   Penicillins     unknown   Amlodipine Hives   Dicyclomine Other (See Comments)    Pt states it makes her hands shake; pt also states a rash.     No family history on file.  Prior to Admission medications   Medication Sig Start Date End Date Taking? Authorizing Provider  acetaminophen-codeine (TYLENOL #4) 300-60 MG tablet Take 1 tablet by mouth daily as needed for pain.    [provider]  bisacodyl (DULCOLAX) 10 MG suppository Place 1 suppository (10 mg total) rectally daily as needed for moderate constipation. 07/22/21   Allie Bossier, MD  clonazePAM (KLONOPIN) 1 MG tablet Take 1 mg by mouth 2 (two) times daily. 04/17/21   [provider]  haloperidol (HALDOL) 5 MG tablet Take 2 tablets (10 mg total) by mouth at bedtime. 04/29/21   Pokhrel, Corrie Mckusick, MD  lamoTRIgine (LAMICTAL) 100 MG tablet Take 1 tablet (100 mg total) by mouth daily. 07/31/21   Allie Bossier, MD  lamoTRIgine (LAMICTAL) 150 MG tablet Take 1  tablet (150 mg total) by mouth daily. 08/07/21   Drema Dallas, MD  lamoTRIgine (LAMICTAL) 200 MG tablet Take 1 tablet (200 mg total) by mouth daily. 08/14/21   Drema Dallas, MD  lamoTRIgine (LAMICTAL) 25 MG tablet Take 1 tablet (25 mg total) by mouth daily. 07/23/21   Drema Dallas, MD  lamoTRIgine (LAMICTAL) 25 MG tablet Take 2 tablets (50 mg total) by mouth daily. 07/24/21   Drema Dallas, MD  metoCLOPramide (REGLAN) 5 MG tablet Take 3 tablets (15 mg total) by  mouth 4 (four) times daily -  before meals and at bedtime. 07/22/21   Drema Dallas, MD  pantoprazole (PROTONIX) 40 MG tablet Take 1 tablet (40 mg total) by mouth daily. Patient not taking: Reported on 07/15/2021 04/29/21 04/29/22  Pokhrel, Rebekah [place], MD  phenol (CHLORASEPTIC) 1.4 % LIQD Use as directed 1 spray in the mouth or throat as needed for throat irritation / pain. 07/22/21   Drema Dallas, MD  promethazine (PHENERGAN) 12.5 MG tablet Take 2 tablets (25 mg total) by mouth every 6 (six) hours as needed for nausea or vomiting. Patient not taking: Reported on 07/15/2021 04/29/21   Joycelyn Das, MD  QUEtiapine (SEROQUEL) 400 MG tablet Take 400 mg by mouth at bedtime.    [provider]    Physical Exam: Vitals:   07/25/21 0430 07/25/21 0445 07/25/21 0500 07/25/21 0515  BP: (!) 152/114 (!) 155/111 (!) 163/121 (!) 112/96  Pulse: 87 91 89 86  Resp: (!) 25 (!) 23 (!) 24 17  Temp:      TempSrc:      SpO2: 98% 98% 99% 99%  Weight:      Height:       Physical Exam Vitals and nursing note reviewed.  Constitutional:      Appearance: She is obese.  HENT:     Head: Normocephalic.     Mouth/Throat:     Mouth: Mucous membranes are dry.  Eyes:     General: No scleral icterus.    Pupils: Pupils are equal, round, and reactive to light.  Neck:     Vascular: No JVD.  Cardiovascular:     Rate and Rhythm: Normal rate and regular rhythm.     Heart sounds: S1 normal and S2 normal.  Pulmonary:     Effort: Pulmonary effort is normal.     Breath sounds: Normal breath sounds.  Abdominal:     General: Bowel sounds are decreased. There is no distension.     Palpations: Abdomen is soft.     Tenderness: There is abdominal tenderness in the right upper quadrant and epigastric area. There is no right CVA tenderness, left CVA tenderness, guarding or rebound.  Musculoskeletal:     Cervical back: Neck supple.     Right lower leg: No edema.     Left lower leg: No edema.  Skin:    General: Skin is  warm and dry.  Neurological:     General: No focal deficit present.     Mental Status: She is alert and oriented to person, place, and time.  Psychiatric:        Mood and Affect: Mood is anxious.        Behavior: Behavior normal.    Data Reviewed:  Results are pending, will review when available.  Assessment and Plan: Principal Problem:   Abdominal pain Associated with:   Intractable vomiting In the setting of:   Gastroparesis Observation/telemetry. Keep NPO. Continue half NS  infusion. Pantoprazole 40 mg IVP daily. Analgesics as needed. Promethazine 25 mg q 6hr IVPB as needed. Continue metoclopramide 10 mg IVP every 6 hours. Follow-up CBC and chemistry in the morning.  Active Problems:   Hypokalemia Correcting. Magnesium has been supplemented. Follow potassium level.    HTN (hypertension) Metoprolol 5 mg IVP every 8 hours.    Bipolar disorder ([place]) Resume home meds once tolerating oral intake. Lorazepam 1 mg IVP every 12 hours as needed for anxiety.    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    Class 3 obesity (HCC) Lifestyle modifications. Follow-up with primary care provider.    Transaminitis Likely from volume depletion. Improved from yesterday. Follow-up hepatic function.    Advance Care Planning:   Code Status: Full Code   Consults:   Family Communication:   Severity of Illness: The appropriate patient status for this patient is OBSERVATION. Observation status is judged to be reasonable and necessary in order to provide the required intensity of service to ensure the patient's safety. The patient's presenting symptoms, physical exam findings, and initial radiographic and laboratory data in the context of their medical condition is felt to place them at decreased risk for further clinical deterioration. Furthermore, it is anticipated that the patient will be medically stable for discharge from the hospital within 2 midnights of  admission.   Author: Reubin Milan, MD 07/25/2021 8:09 AM  For on call review [email or web address].   This document was prepared using Dragon voice recognition software and may contain some unintended transcription errors.

## 2021-07-25 NOTE — ED Notes (Signed)
Patient ambulatory to bathroom.

## 2021-07-26 DIAGNOSIS — I10 Essential (primary) hypertension: Secondary | ICD-10-CM | POA: Diagnosis present

## 2021-07-26 DIAGNOSIS — F1210 Cannabis abuse, uncomplicated: Secondary | ICD-10-CM | POA: Diagnosis present

## 2021-07-26 DIAGNOSIS — R1110 Vomiting, unspecified: Secondary | ICD-10-CM | POA: Diagnosis not present

## 2021-07-26 DIAGNOSIS — Z818 Family history of other mental and behavioral disorders: Secondary | ICD-10-CM | POA: Diagnosis not present

## 2021-07-26 DIAGNOSIS — E869 Volume depletion, unspecified: Secondary | ICD-10-CM | POA: Diagnosis present

## 2021-07-26 DIAGNOSIS — K3184 Gastroparesis: Secondary | ICD-10-CM | POA: Diagnosis present

## 2021-07-26 DIAGNOSIS — E876 Hypokalemia: Secondary | ICD-10-CM | POA: Diagnosis present

## 2021-07-26 DIAGNOSIS — Z9151 Personal history of suicidal behavior: Secondary | ICD-10-CM | POA: Diagnosis not present

## 2021-07-26 DIAGNOSIS — F419 Anxiety disorder, unspecified: Secondary | ICD-10-CM | POA: Diagnosis present

## 2021-07-26 DIAGNOSIS — Z6841 Body Mass Index (BMI) 40.0 and over, adult: Secondary | ICD-10-CM | POA: Diagnosis not present

## 2021-07-26 DIAGNOSIS — Z880 Allergy status to penicillin: Secondary | ICD-10-CM | POA: Diagnosis not present

## 2021-07-26 DIAGNOSIS — G8929 Other chronic pain: Secondary | ICD-10-CM | POA: Diagnosis present

## 2021-07-26 DIAGNOSIS — R7401 Elevation of levels of liver transaminase levels: Secondary | ICD-10-CM | POA: Diagnosis present

## 2021-07-26 DIAGNOSIS — E0500 Thyrotoxicosis with diffuse goiter without thyrotoxic crisis or storm: Secondary | ICD-10-CM | POA: Diagnosis present

## 2021-07-26 DIAGNOSIS — Z888 Allergy status to other drugs, medicaments and biological substances status: Secondary | ICD-10-CM | POA: Diagnosis not present

## 2021-07-26 DIAGNOSIS — F319 Bipolar disorder, unspecified: Secondary | ICD-10-CM | POA: Diagnosis present

## 2021-07-26 DIAGNOSIS — K5900 Constipation, unspecified: Secondary | ICD-10-CM | POA: Diagnosis present

## 2021-07-26 DIAGNOSIS — F3111 Bipolar disorder, current episode manic without psychotic features, mild: Secondary | ICD-10-CM | POA: Diagnosis not present

## 2021-07-26 DIAGNOSIS — F3130 Bipolar disorder, current episode depressed, mild or moderate severity, unspecified: Secondary | ICD-10-CM | POA: Diagnosis not present

## 2021-07-26 DIAGNOSIS — F1010 Alcohol abuse, uncomplicated: Secondary | ICD-10-CM | POA: Diagnosis present

## 2021-07-26 DIAGNOSIS — Z79899 Other long term (current) drug therapy: Secondary | ICD-10-CM | POA: Diagnosis not present

## 2021-07-26 DIAGNOSIS — F4310 Post-traumatic stress disorder, unspecified: Secondary | ICD-10-CM | POA: Diagnosis present

## 2021-07-26 DIAGNOSIS — Z87891 Personal history of nicotine dependence: Secondary | ICD-10-CM | POA: Diagnosis not present

## 2021-07-26 LAB — CBC
HCT: 37.4 % (ref 36.0–46.0)
Hemoglobin: 12.5 g/dL (ref 12.0–15.0)
MCH: 30.3 pg (ref 26.0–34.0)
MCHC: 33.4 g/dL (ref 30.0–36.0)
MCV: 90.8 fL (ref 80.0–100.0)
Platelets: 319 10*3/uL (ref 150–400)
RBC: 4.12 MIL/uL (ref 3.87–5.11)
RDW: 14.2 % (ref 11.5–15.5)
WBC: 11.4 10*3/uL — ABNORMAL HIGH (ref 4.0–10.5)
nRBC: 0 % (ref 0.0–0.2)

## 2021-07-26 LAB — RAPID URINE DRUG SCREEN, HOSP PERFORMED
Amphetamines: NOT DETECTED
Barbiturates: NOT DETECTED
Benzodiazepines: POSITIVE — AB
Cocaine: NOT DETECTED
Opiates: POSITIVE — AB
Tetrahydrocannabinol: POSITIVE — AB

## 2021-07-26 LAB — COMPREHENSIVE METABOLIC PANEL
ALT: 51 U/L — ABNORMAL HIGH (ref 0–44)
AST: 25 U/L (ref 15–41)
Albumin: 3.8 g/dL (ref 3.5–5.0)
Alkaline Phosphatase: 70 U/L (ref 38–126)
Anion gap: 7 (ref 5–15)
BUN: 6 mg/dL (ref 6–20)
CO2: 26 mmol/L (ref 22–32)
Calcium: 8 mg/dL — ABNORMAL LOW (ref 8.9–10.3)
Chloride: 107 mmol/L (ref 98–111)
Creatinine, Ser: 0.8 mg/dL (ref 0.44–1.00)
GFR, Estimated: >60 mL/min (ref >60)
Glucose, Bld: 87 mg/dL (ref 70–99)
Potassium: 3.3 mmol/L — ABNORMAL LOW (ref 3.5–5.1)
Sodium: 140 mmol/L (ref 135–145)
Total Bilirubin: 0.8 mg/dL (ref 0.3–1.2)
Total Protein: 6.5 g/dL (ref 6.5–8.1)

## 2021-07-26 NOTE — Progress Notes (Signed)
PROGRESS NOTE    Deborah Kelly  DQQ:[id number] DOB: 21-Dec-1990 DOA: 07/24/2021 PCP: Pcp, No  Brief Narrative: 31 year old female with history of alcohol abuse, bipolar disorder cannabis hyperemesis, morbid obesity, Graves' disease and gastroparesis admitted with nausea and vomiting for the last 2 days.  Her last bowel movement was 2 days ago but she was having diarrhea at that point.  She was just discharged from the hospital 07/22/2021 per her request.  She states she was not ready to be discharged but she requested discharge and she went home. In the ER UA was positive for ketones and proteins normal lipase with a white count of 14.6.  CT abdomen and pelvis showed no evidence of bowel obstruction.  Review of the chart shows that follow-up follows up with GI at [facility] she recently moved to [place].  Gastric emptying study in the past 2017 revealed mild late phase gastroparesis.  Her urine drug screen was positive for THC in the last admission.  She was treated with Reglan high-dose 15 mg. Assessment & Plan:   Principal Problem:   Intractable vomiting Active Problems:   Gastroparesis   HTN (hypertension)   Bipolar disorder (HCC)   Hypokalemia   Abdominal pain   Cannabis misuse   Class 3 obesity (HCC)   Transaminitis    #1 intractable nausea vomiting in the setting of gastroparesis and cannabis abuse patient still unable to keep anything down.  Continue Reglan and Phenergan.  Continue IV fluids. White count on admission was 16.2 trending down to 11.4.  #2 history of essential hypertension patient takes tenoretic which is on hold since she is not able to tolerate p.o. intake.  Continue as needed hydralazine.  #3 morbid obesity complicates overall prognosis and needs ongoing counseling and following up with PCP.  #4 hypokalemia potassium 3.3 magnesium 1.9 phosphorus 3.8  #5 bipolar disorder continue Seroquel and Lamictal  #6 transaminitis follow-up levels in a.m.   #7  history of Graves' disease TSH normal not on any supplements.  Estimated body mass index is 45.61 kg/m as calculated from the following:   Height as of this encounter: 5\' 8"  (1.727 m).   Weight as of this encounter: 136.1 kg.  DVT prophylaxis: Lovenox  code Status: Full code Family Communication: None at bedside  disposition Plan:  Status is: Inpatient    Consultants:  None  Procedures: None Antimicrobials: None  Subjective:  Patient continues to complain of nausea vomiting denies any abdominal pain or diarrhea unable to keep anything down has not had any p.o. intake remains on IV fluids Objective: Vitals:   07/25/21 2125 07/25/21 2250 07/26/21 0300 07/26/21 0603  BP: (!) 96/55 111/63 (!) 105/55 104/65  Pulse: 74 79 91 72  Resp:  17 17 18   Temp:  97.8 F (36.6 C) 97.6 F (36.4 C) 97.8 F (36.6 C)  TempSrc:  Oral Oral Oral  SpO2:  94% 96%   Weight:      Height:        Intake/Output Summary (Last 24 hours) at 07/26/2021 1201 Last data filed at 07/26/2021 0331 Gross per 24 hour  Intake 1742.79 ml  Output --  Net 1742.79 ml   Filed Weights   07/24/21 1856  Weight: 136.1 kg    Examination:  General exam: Appears in no acute distress  respiratory system: Clear to auscultation. Respiratory effort normal. Cardiovascular system: S1 & S2 heard, RRR. No JVD, murmurs, rubs, gallops or clicks. No pedal edema. Gastrointestinal system: Abdomen is nondistended, soft and  nontender. No organomegaly or masses felt. Normal bowel sounds heard. Central nervous system: Alert and oriented. No focal neurological deficits. Extremities: No edema  skin: No rashes, lesions or ulcers Psychiatry: Judgement and insight appear normal. Mood & affect appropriate.     Data Reviewed: I have personally reviewed following labs and imaging studies  CBC: Recent Labs  Lab 07/20/21 0436 07/21/21 0426 07/22/21 0451 07/24/21 1919 07/25/21 0806 07/26/21 0613  WBC 9.3 8.5 8.3 14.6* 16.2*  11.4*  NEUTROABS 4.4 4.4 4.5  --   --   --   HGB 13.5 13.2 13.1 15.4* 14.8 12.5  HCT 40.6 40.2 40.0 47.0* 43.5 37.4  MCV 92.3 90.7 91.7 90.4 90.1 90.8  PLT 333 349 320 424* 383 319   Basic Metabolic Panel: Recent Labs  Lab 07/20/21 0436 07/21/21 0426 07/22/21 0451 07/24/21 1919 07/25/21 0806 07/26/21 0613  NA 142 137 138 143 145 140  K 4.3 3.8 4.1 3.9 3.4* 3.3*  CL 108 103 106 110 107 107  CO2 27 26 24 23 27 26   GLUCOSE 101* 93 81 156* 131* 87  BUN <5* <5* <5* 10 10 6   CREATININE 0.76 0.84 0.76 1.12* 0.99 0.80  CALCIUM 9.1 8.9 8.8* 9.7 9.3 8.0*  MG 1.8 1.8 1.8  --  1.9  --   PHOS 4.0 4.9* 4.6  --  3.8  --    GFR: Estimated Creatinine Clearance: 153.4 mL/min (by C-G formula based on SCr of 0.8 mg/dL). Liver Function Tests: Recent Labs  Lab 07/21/21 0426 07/22/21 0451 07/24/21 1919 07/25/21 0806 07/26/21 0613  AST 56* 97* 45* 33 25  ALT 71* 110* 90* 74* 51*  ALKPHOS 83 71 87 82 70  BILITOT 0.6 0.7 0.6 0.6 0.8  PROT 6.7 6.2* 8.6* 8.0 6.5  ALBUMIN 3.7 3.4* 4.5 4.5 3.8   Recent Labs  Lab 07/24/21 1919  LIPASE 26   No results for input(s): AMMONIA in the last 168 hours. Coagulation Profile: No results for input(s): INR, PROTIME in the last 168 hours. Cardiac Enzymes: No results for input(s): CKTOTAL, CKMB, CKMBINDEX, TROPONINI in the last 168 hours. BNP (last 3 results) No results for input(s): PROBNP in the last 8760 hours. HbA1C: No results for input(s): HGBA1C in the last 72 hours. CBG: No results for input(s): GLUCAP in the last 168 hours. Lipid Profile: No results for input(s): CHOL, HDL, LDLCALC, TRIG, CHOLHDL, LDLDIRECT in the last 72 hours. Thyroid Function Tests: No results for input(s): TSH, T4TOTAL, FREET4, T3FREE, THYROIDAB in the last 72 hours. Anemia Panel: No results for input(s): VITAMINB12, FOLATE, FERRITIN, TIBC, IRON, RETICCTPCT in the last 72 hours. Sepsis Labs: No results for input(s): PROCALCITON, LATICACIDVEN in the last 168  hours.  No results found for this or any previous visit (from the past 240 hour(s)).       Radiology Studies: CT Abdomen Pelvis W Contrast  Result Date: 07/25/2021 CLINICAL DATA:  Abdominal pain with elevated white blood cell count EXAM: CT ABDOMEN AND PELVIS WITH CONTRAST TECHNIQUE: Multidetector CT imaging of the abdomen and pelvis was performed using the standard protocol following bolus administration of intravenous contrast. RADIATION DOSE REDUCTION: This exam was performed according to the departmental dose-optimization program which includes automated exposure control, adjustment of the mA and/or kV according to patient size and/or use of iterative reconstruction technique. CONTRAST:  07/26/21 OMNIPAQUE IOHEXOL 300 MG/ML  SOLN COMPARISON:  CT from 07/17/2021, plain film from 07/21/2021 FINDINGS: Lower chest: No acute abnormality. Hepatobiliary: No focal liver abnormality is  seen. No gallstones, gallbladder wall thickening, or biliary dilatation. Pancreas: Unremarkable. No pancreatic ductal dilatation or surrounding inflammatory changes. Spleen: Normal in size without focal abnormality. Adrenals/Urinary Tract: Adrenal glands are within normal limits. Kidneys demonstrate a normal enhancement pattern bilaterally. No renal calculi or obstructive changes are seen. The bladder is partially distended. Stomach/Bowel: Colon is predominately decompressed. The appendix is within normal limits. No obstructive changes are seen. The small bowel shows no dilatation. Stomach is within normal limits. Vascular/Lymphatic: No significant atherosclerotic changes are noted. A few small right lower quadrant lymph nodes are seen slightly smaller than that noted on the prior exam. No new lymphadenopathy is noted. Reproductive: Status post hysterectomy. No adnexal masses. Other: No abdominal wall hernia or abnormality. No abdominopelvic ascites. Musculoskeletal: No acute or significant osseous findings. IMPRESSION: No evidence  of small-bowel obstruction. Scattered small right lower quadrant lymph nodes are seen slightly decreased in size when compared with the prior exam. Electronically Signed   By: Alcide CleverMark  Lukens M.D.   On: 07/25/2021 02:44        Scheduled Meds:  metoCLOPramide (REGLAN) injection  10 mg Intravenous Q6H   metoprolol tartrate  5 mg Intravenous Q8H   pantoprazole (PROTONIX) IV  40 mg Intravenous Q24H   QUEtiapine  100 mg Oral q morning   QUEtiapine  300 mg Oral QHS   Continuous Infusions:  0.45 % NaCl with KCl 20 mEq / L 125 mL/hr at 07/26/21 0446   promethazine (PHENERGAN) injection (IM or IVPB) 25 mg (07/26/21 0952)     LOS: 0 days    Time spent: 39 minutes  Alwyn RenElizabeth G Shep Porter, MD 07/26/2021, 12:01 PM

## 2021-07-27 DIAGNOSIS — R1110 Vomiting, unspecified: Secondary | ICD-10-CM | POA: Diagnosis not present

## 2021-07-27 DIAGNOSIS — F3130 Bipolar disorder, current episode depressed, mild or moderate severity, unspecified: Secondary | ICD-10-CM

## 2021-07-27 LAB — CBC
HCT: 37.1 % (ref 36.0–46.0)
Hemoglobin: 12.4 g/dL (ref 12.0–15.0)
MCH: 30.5 pg (ref 26.0–34.0)
MCHC: 33.4 g/dL (ref 30.0–36.0)
MCV: 91.4 fL (ref 80.0–100.0)
Platelets: 325 10*3/uL (ref 150–400)
RBC: 4.06 MIL/uL (ref 3.87–5.11)
RDW: 13.6 % (ref 11.5–15.5)
WBC: 8.2 10*3/uL (ref 4.0–10.5)
nRBC: 0 % (ref 0.0–0.2)

## 2021-07-27 LAB — BASIC METABOLIC PANEL
Anion gap: 6 (ref 5–15)
BUN: 5 mg/dL — ABNORMAL LOW (ref 6–20)
CO2: 25 mmol/L (ref 22–32)
Calcium: 8.3 mg/dL — ABNORMAL LOW (ref 8.9–10.3)
Chloride: 109 mmol/L (ref 98–111)
Creatinine, Ser: 0.77 mg/dL (ref 0.44–1.00)
GFR, Estimated: >60 mL/min (ref >60)
Glucose, Bld: 83 mg/dL (ref 70–99)
Potassium: 3.9 mmol/L (ref 3.5–5.1)
Sodium: 140 mmol/L (ref 135–145)

## 2021-07-27 MED ORDER — LAMOTRIGINE 100 MG PO TABS: 100.0000 mg | ORAL_TABLET | Freq: Every day | ORAL | Status: DC

## 2021-07-27 MED ORDER — LAMOTRIGINE 100 MG PO TABS: 200.0000 mg | ORAL_TABLET | Freq: Every day | ORAL | Status: DC

## 2021-07-27 MED ORDER — LAMOTRIGINE 25 MG PO TABS
25.0000 mg | ORAL_TABLET | Freq: Every day | ORAL | Status: DC
  Administered 2021-07-27 – 2021-08-01 (×6): 25 mg via ORAL
  Filled 2021-07-27 (×6): qty 1

## 2021-07-27 MED ORDER — LAMOTRIGINE 25 MG PO TABS: 50.0000 mg | ORAL_TABLET | Freq: Every day | ORAL | Status: DC

## 2021-07-27 NOTE — Progress Notes (Signed)
PROGRESS NOTE    Deborah Kelly  BTD:[id number] DOB: Oct 19, 1990 DOA: 07/24/2021 PCP: Pcp, No  Brief Narrative: 31 year old female with history of alcohol abuse, bipolar disorder cannabis hyperemesis, morbid obesity, Graves' disease and gastroparesis admitted with nausea and vomiting for the last 2 days.  Her last bowel movement was 2 days ago but she was having diarrhea at that point.  She was just discharged from the hospital 07/22/2021 per her request.  She states she was not ready to be discharged but she requested discharge and she went home. In the ER UA was positive for ketones and proteins normal lipase with a white count of 14.6.  CT abdomen and pelvis showed no evidence of bowel obstruction.  Review of the chart shows that follow-up follows up with GI at [facility] she recently moved to [place].  Gastric emptying study in the past 2017 revealed mild late phase gastroparesis.  Her urine drug screen was positive for THC in the last admission.  She was treated with Reglan high-dose 15 mg. Assessment & Plan:   Principal Problem:   Intractable vomiting Active Problems:   Gastroparesis   HTN (hypertension)   Bipolar disorder (HCC)   Hypokalemia   Abdominal pain   Cannabis misuse   Class 3 obesity (HCC)   Transaminitis    #1 intractable nausea vomiting in the setting of gastroparesis and cannabis abuse -her nausea is better she is willing to try clear liquids today.  Continue Reglan and Phenergan.  Continue IV fluids. Leukocytosis resolved.  #2 history of essential hypertension blood pressure soft antihypertensives on hold   #3 morbid obesity complicates overall prognosis and needs ongoing counseling and following up with PCP.  #4 hypokalemia resolved   #5 bipolar disorder continue Seroquel and Lamictal  #6 transaminitis improving   #7 history of Graves' disease TSH normal not on any supplements.  Estimated body mass index is 45.61 kg/m as calculated from the  following:   Height as of this encounter: 5\' 8"  (1.727 m).   Weight as of this encounter: 136.1 kg.  DVT prophylaxis: Lovenox  code Status: Full code Family Communication: None at bedside  disposition Plan:  Status is: Inpatient    Consultants:  None  Procedures: None Antimicrobials: None  Subjective: Nausea is better she has been n.p.o. on IV fluids she is willing to try clear liquids today she has not had a bowel movement no diarrhea  objective: Vitals:   07/26/21 1415 07/26/21 2033 07/26/21 2039 07/27/21 0448  BP: (!) 86/55 (!) 87/50 108/66 118/77  Pulse: 75 73 72 80  Resp: 15 18  18   Temp: (!) 97.3 F (36.3 C) 98.2 F (36.8 C)  98 F (36.7 C)  TempSrc: Oral Oral  Oral  SpO2: 96% 97%  97%  Weight:      Height:        Intake/Output Summary (Last 24 hours) at 07/27/2021 1214 Last data filed at 07/27/2021 1100 Gross per 24 hour  Intake 0 ml  Output --  Net 0 ml    Filed Weights   07/24/21 1856  Weight: 136.1 kg    Examination:  General exam: Appears in no acute distress  respiratory system: Clear to auscultation. Respiratory effort normal. Cardiovascular system: S1 & S2 heard, RRR. No JVD, murmurs, rubs, gallops or clicks. No pedal edema. Gastrointestinal system: Abdomen is nondistended, soft and nontender. No organomegaly or masses felt. Normal bowel sounds heard. Central nervous system: Alert and oriented. No focal neurological deficits. Extremities: No edema  skin: No rashes, lesions or ulcers Psychiatry: Judgement and insight appear normal. Mood & affect appropriate.     Data Reviewed: I have personally reviewed following labs and imaging studies  CBC: Recent Labs  Lab 07/21/21 0426 07/22/21 0451 07/24/21 1919 07/25/21 0806 07/26/21 0613 07/27/21 0718  WBC 8.5 8.3 14.6* 16.2* 11.4* 8.2  NEUTROABS 4.4 4.5  --   --   --   --   HGB 13.2 13.1 15.4* 14.8 12.5 12.4  HCT 40.2 40.0 47.0* 43.5 37.4 37.1  MCV 90.7 91.7 90.4 90.1 90.8 91.4  PLT 349  320 424* 383 319 325    Basic Metabolic Panel: Recent Labs  Lab 07/21/21 0426 07/22/21 0451 07/24/21 1919 07/25/21 0806 07/26/21 0613 07/27/21 0718  NA 137 138 143 145 140 140  K 3.8 4.1 3.9 3.4* 3.3* 3.9  CL 103 106 110 107 107 109  CO2 26 24 23 27 26 25   GLUCOSE 93 81 156* 131* 87 83  BUN <5* <5* 10 10 6  5*  CREATININE 0.84 0.76 1.12* 0.99 0.80 0.77  CALCIUM 8.9 8.8* 9.7 9.3 8.0* 8.3*  MG 1.8 1.8  --  1.9  --   --   PHOS 4.9* 4.6  --  3.8  --   --     GFR: Estimated Creatinine Clearance: 153.4 mL/min (by C-G formula based on SCr of 0.77 mg/dL). Liver Function Tests: Recent Labs  Lab 07/21/21 0426 07/22/21 0451 07/24/21 1919 07/25/21 0806 07/26/21 0613  AST 56* 97* 45* 33 25  ALT 71* 110* 90* 74* 51*  ALKPHOS 83 71 87 82 70  BILITOT 0.6 0.7 0.6 0.6 0.8  PROT 6.7 6.2* 8.6* 8.0 6.5  ALBUMIN 3.7 3.4* 4.5 4.5 3.8    Recent Labs  Lab 07/24/21 1919  LIPASE 26    No results for input(s): AMMONIA in the last 168 hours. Coagulation Profile: No results for input(s): INR, PROTIME in the last 168 hours. Cardiac Enzymes: No results for input(s): CKTOTAL, CKMB, CKMBINDEX, TROPONINI in the last 168 hours. BNP (last 3 results) No results for input(s): PROBNP in the last 8760 hours. HbA1C: No results for input(s): HGBA1C in the last 72 hours. CBG: No results for input(s): GLUCAP in the last 168 hours. Lipid Profile: No results for input(s): CHOL, HDL, LDLCALC, TRIG, CHOLHDL, LDLDIRECT in the last 72 hours. Thyroid Function Tests: No results for input(s): TSH, T4TOTAL, FREET4, T3FREE, THYROIDAB in the last 72 hours. Anemia Panel: No results for input(s): VITAMINB12, FOLATE, FERRITIN, TIBC, IRON, RETICCTPCT in the last 72 hours. Sepsis Labs: No results for input(s): PROCALCITON, LATICACIDVEN in the last 168 hours.  No results found for this or any previous visit (from the past 240 hour(s)).       Radiology Studies: No results found.      Scheduled  Meds:  metoCLOPramide (REGLAN) injection  10 mg Intravenous Q6H   metoprolol tartrate  5 mg Intravenous Q8H   pantoprazole (PROTONIX) IV  40 mg Intravenous Q24H   QUEtiapine  100 mg Oral q morning   QUEtiapine  300 mg Oral QHS   Continuous Infusions:  0.45 % NaCl with KCl 20 mEq / L 125 mL/hr at 07/26/21 1312   promethazine (PHENERGAN) injection (IM or IVPB) 25 mg (07/27/21 0618)     LOS: 1 day    Time spent: 39 minutes  07/28/21, MD 07/27/2021, 12:14 PM
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
## 2021-07-28 DIAGNOSIS — R1110 Vomiting, unspecified: Secondary | ICD-10-CM | POA: Diagnosis not present

## 2021-07-28 MED ORDER — BOOST / RESOURCE BREEZE PO LIQD CUSTOM
1.0000 | Freq: Three times a day (TID) | ORAL | Status: DC
  Administered 2021-07-28 – 2021-08-01 (×4): 1 via ORAL

## 2021-07-28 NOTE — Progress Notes (Signed)
Initial Nutrition Assessment  DOCUMENTATION CODES:   Morbid obesity  INTERVENTION:   - Please obtain measured admission weight  - Recommend bowel regimen as pt reports last BM was on 07/21/21  - Trial Boost Breeze po TID, each supplement provides 250 kcal and 9 grams of protein  NUTRITION DIAGNOSIS:   Inadequate oral intake related to nausea, vomiting as evidenced by per patient/family report.  GOAL:   Patient will meet greater than or equal to 90% of their needs  MONITOR:   PO intake, Supplement acceptance, Diet advancement, Weight trends  REASON FOR ASSESSMENT:   Malnutrition Screening Tool    ASSESSMENT:   31 year old female who presented to the ED on 5/18 with abdominal pain and N/V. PMH of gastroparesis, EtOH abuse, anxiety, depression, bipolar 1 disorder, cannabis hyperemesis, Graves' disease.  05/21 - diet advanced to clear liquid 05/22 - diet advanced to full liquid  RD working remotely. Attempted to reach pt via phone call to room but no answer.  Weight on admission appears to be stated rather than measured. If accurate, pt has experienced a 9.9 kg (6.8%) weight loss since 07/15/21 which is significant for timeframe. Recommend obtaining measured admission weight.  RD to order clear liquid oral nutrition supplement and assess for tolerance. Also recommend adding bowel regimen as pt reports last BM was on 07/21/21.  RD will attempt to obtain further diet and weight history at follow-up.  Meal Completion: 0% x 1 documented meal on 5/21  Medications reviewed and include: lamictal, IV reglan 10 mg q 6 hours, IV protonix, IV phenergan PRN IVF: 1/2 NS with KCl @ 75 ml/hr  Labs reviewed.  NUTRITION - FOCUSED PHYSICAL EXAM:  Unable to complete at this time. RD working remotely.  Diet Order:   Diet Order             Diet full liquid Room service appropriate? Yes; Fluid consistency: Thin  Diet effective now                   EDUCATION NEEDS:   No  education needs have been identified at this time  Skin:  Skin Assessment: Reviewed RN Assessment  Last BM:  07/21/21  Height:   Ht Readings from Last 1 Encounters:  07/24/21 5\' 8"  (1.727 m)    Weight:   Wt Readings from Last 1 Encounters:  07/24/21 136.1 kg    BMI:  Body mass index is 45.61 kg/m.  Estimated Nutritional Needs:   Kcal:  2000-2200  Protein:  100-120 grams  Fluid:  2.0 L    Gustavus Bryant, MS, RD, LDN Inpatient Clinical Dietitian Please see AMiON for contact information.

## 2021-07-28 NOTE — Progress Notes (Signed)
PROGRESS NOTE    Deborah Kelly  NID:[id number] DOB: 20-May-1990 DOA: 07/24/2021 PCP: Pcp, No  Brief Narrative: 31 year old female with history of alcohol abuse, bipolar disorder cannabis hyperemesis, morbid obesity, Graves' disease and gastroparesis admitted with nausea and vomiting for the last 2 days.  Her last bowel movement was 2 days ago but she was having diarrhea at that point.  She was just discharged from the hospital 07/22/2021 per her request.  She states she was not ready to be discharged but she requested discharge and she went home. In the ER UA was positive for ketones and proteins normal lipase with a white count of 14.6.  CT abdomen and pelvis showed no evidence of bowel obstruction.  Review of the chart shows that follow-up follows up with GI at [facility] she recently moved to [place].  Gastric emptying study in the past 2017 revealed mild late phase gastroparesis.  Her urine drug screen was positive for THC in the last admission.  She was treated with Reglan high-dose 15 mg. Assessment & Plan:   Principal Problem:   Intractable vomiting Active Problems:   Gastroparesis   HTN (hypertension)   Bipolar disorder (HCC)   Hypokalemia   Abdominal pain   Cannabis misuse   Class 3 obesity (HCC)   Transaminitis    #1 intractable nausea vomiting in the setting of gastroparesis and cannabis abuse -her nausea is better she is willing to try clear liquids today.  Continue Reglan and Phenergan.  Continue IV fluids. Leukocytosis resolved. Advance diet to full liquids today.  #2 history of essential hypertension blood pressure soft antihypertensives on hold   #3 morbid obesity complicates overall prognosis and needs ongoing counseling and following up with PCP.  #4 hypokalemia resolved   #5 bipolar disorder continue Seroquel and Lamictal  #6 transaminitis improving   #7 history of Graves' disease TSH normal not on any supplements.  Estimated body mass index is 45.61  kg/m as calculated from the following:   Height as of this encounter: 5\' 8"  (1.727 m).   Weight as of this encounter: 136.1 kg.  DVT prophylaxis: Lovenox  code Status: Full code Family Communication: None at bedside  disposition Plan:  Status is: Inpatient    Consultants:  None  Procedures: None Antimicrobials: None  Subjective:  She thinks she is ready for full liquid diet still has some abdominal discomfort and pain  objective: Vitals:   07/26/21 2039 07/27/21 0448 07/27/21 1616 07/28/21 0407  BP: 108/66 118/77 115/72 115/67  Pulse: 72 80 71 91  Resp:  18  18  Temp:  98 F (36.7 C) 98.3 F (36.8 C) 98.2 F (36.8 C)  TempSrc:  Oral Oral Oral  SpO2:  97% 96% 96%  Weight:      Height:       No intake or output data in the 24 hours ending 07/28/21 1644  Filed Weights   07/24/21 1856  Weight: 136.1 kg    Examination:  General exam: Appears in no acute distress  respiratory system: Clear to auscultation. Respiratory effort normal. Cardiovascular system: S1 & S2 heard, RRR. No JVD, murmurs, rubs, gallops or clicks. No pedal edema. Gastrointestinal system: Abdomen is nondistended, soft and nontender. No organomegaly or masses felt. Normal bowel sounds heard. Central nervous system: Alert and oriented. No focal neurological deficits. Extremities: No edema  skin: No rashes, lesions or ulcers Psychiatry: Judgement and insight appear normal. Mood & affect appropriate.     Data Reviewed: I have personally reviewed  following labs and imaging studies  CBC: Recent Labs  Lab 07/22/21 0451 07/24/21 1919 07/25/21 0806 07/26/21 0613 07/27/21 0718  WBC 8.3 14.6* 16.2* 11.4* 8.2  NEUTROABS 4.5  --   --   --   --   HGB 13.1 15.4* 14.8 12.5 12.4  HCT 40.0 47.0* 43.5 37.4 37.1  MCV 91.7 90.4 90.1 90.8 91.4  PLT 320 424* 383 319 325    Basic Metabolic Panel: Recent Labs  Lab 07/22/21 0451 07/24/21 1919 07/25/21 0806 07/26/21 0613 07/27/21 0718  NA 138 143 145  140 140  K 4.1 3.9 3.4* 3.3* 3.9  CL 106 110 107 107 109  CO2 24 23 27 26 25   GLUCOSE 81 156* 131* 87 83  BUN <5* 10 10 6  5*  CREATININE 0.76 1.12* 0.99 0.80 0.77  CALCIUM 8.8* 9.7 9.3 8.0* 8.3*  MG 1.8  --  1.9  --   --   PHOS 4.6  --  3.8  --   --     GFR: Estimated Creatinine Clearance: 153.4 mL/min (by C-G formula based on SCr of 0.77 mg/dL). Liver Function Tests: Recent Labs  Lab 07/22/21 0451 07/24/21 1919 07/25/21 0806 07/26/21 0613  AST 97* 45* 33 25  ALT 110* 90* 74* 51*  ALKPHOS 71 87 82 70  BILITOT 0.7 0.6 0.6 0.8  PROT 6.2* 8.6* 8.0 6.5  ALBUMIN 3.4* 4.5 4.5 3.8    Recent Labs  Lab 07/24/21 1919  LIPASE 26    No results for input(s): AMMONIA in the last 168 hours. Coagulation Profile: No results for input(s): INR, PROTIME in the last 168 hours. Cardiac Enzymes: No results for input(s): CKTOTAL, CKMB, CKMBINDEX, TROPONINI in the last 168 hours. BNP (last 3 results) No results for input(s): PROBNP in the last 8760 hours. HbA1C: No results for input(s): HGBA1C in the last 72 hours. CBG: No results for input(s): GLUCAP in the last 168 hours. Lipid Profile: No results for input(s): CHOL, HDL, LDLCALC, TRIG, CHOLHDL, LDLDIRECT in the last 72 hours. Thyroid Function Tests: No results for input(s): TSH, T4TOTAL, FREET4, T3FREE, THYROIDAB in the last 72 hours. Anemia Panel: No results for input(s): VITAMINB12, FOLATE, FERRITIN, TIBC, IRON, RETICCTPCT in the last 72 hours. Sepsis Labs: No results for input(s): PROCALCITON, LATICACIDVEN in the last 168 hours.  No results found for this or any previous visit (from the past 240 hour(s)).       Radiology Studies: No results found.      Scheduled Meds:  feeding supplement  1 Container Oral TID BM   lamoTRIgine  25 mg Oral Daily   Followed by   07/28/21 ON 08/10/2021] lamoTRIgine  50 mg Oral Daily   Followed by   Melene Muller ON 08/24/2021] lamoTRIgine  100 mg Oral Daily   Followed by   Melene Muller ON  08/31/2021] lamoTRIgine  200 mg Oral Daily   metoCLOPramide (REGLAN) injection  10 mg Intravenous Q6H   metoprolol tartrate  5 mg Intravenous Q8H   pantoprazole (PROTONIX) IV  40 mg Intravenous Q24H   QUEtiapine  100 mg Oral q morning   QUEtiapine  300 mg Oral QHS   Continuous Infusions:  0.45 % NaCl with KCl 20 mEq / L 75 mL/hr at 07/27/21 1613   promethazine (PHENERGAN) injection (IM or IVPB) 25 mg (07/28/21 1225)     LOS: 2 days    Time spent: 39 minutes  07/29/21, MD 07/28/2021, 4:44 PM

## 2021-07-28 NOTE — Plan of Care (Signed)
?  Problem: Clinical Measurements: ?Goal: Will remain free from infection ?Outcome: Progressing ?  ?

## 2021-07-29 ENCOUNTER — Inpatient Hospital Stay (HOSPITAL_COMMUNITY): Payer: No Typology Code available for payment source

## 2021-07-29 DIAGNOSIS — R1110 Vomiting, unspecified: Secondary | ICD-10-CM | POA: Diagnosis not present

## 2021-07-29 MED ORDER — POLYETHYLENE GLYCOL 3350 17 G PO PACK
17.0000 g | PACK | Freq: Every day | ORAL | Status: DC
  Administered 2021-07-30 – 2021-08-01 (×3): 17 g via ORAL
  Filled 2021-07-29 (×2): qty 1

## 2021-07-29 MED ORDER — BISACODYL 5 MG PO TBEC
10.0000 mg | DELAYED_RELEASE_TABLET | Freq: Every day | ORAL | Status: DC
  Administered 2021-07-30 – 2021-08-01 (×3): 10 mg via ORAL
  Filled 2021-07-29 (×3): qty 2

## 2021-07-29 NOTE — Progress Notes (Signed)
24 hour chart audit completed 

## 2021-07-29 NOTE — Consult Note (Signed)
Referring Provider: [facility] Primary Care Physician:  [facility]:  Gentry Fitz  Reason for Consultation: Irretractable nausea and vomiting, abdominal pain  HPI: Deborah Kelly is a 31 y.o. female with history of alcohol abuse, bipolar disorder, PTSD, cannabis hyperemesis, Hx intractable abdominal pain and vomiting, class III obesity with a BMI of 45.61kg/m. Graves' disease and gastroparesis admitted with nausea and vomiting for the last 2 days.   Patient was previously admitted to the hospital 5/9 to 07/22/2021.  At that time CT scan negative for acute processes.  Previous admission BMI was 48.94 now 45.61. Patient had negative KUB on 5/10.  CT abdomen pelvis with no findings of SBO or ileus on 5/11.  Patient's diet was regressed due to significant pain with solid foods.  Patient started on SBO protocol with NG tube and Reglan increased to 15 mg on 5/14.  515 NG tube was discontinued and by 516 patient's symptoms had improved and she was requesting to go home.  Today patient notes she had not had a bowel movement 2 days after she was discharged.  She began to have increasing nausea and vomiting and abdominal pain.  She then presented to the ED again.  CT abdomen pelvis with contrast on 07/25/2021 showed no evidence of small bowel obstruction.  anytime she tries to eat anything she will have vomiting.  She has not had a full meal in 2 weeks.  Notes yesterday she attempted to eat broccoli and cheddar soup which induced vomiting.  No vomiting today.  Denies melena, hematochezia.  Patient has not had bowel movement since previous discharge.  She does not have any appetite and feels anxious to eat food.    Patient reports she has had ongoing cycles of nausea, vomiting, abdominal pain since she turned 25.  Patient notes the cycles last for about a week at a time.  The cycles occur every 3 weeks every 3 months.  Each cycle start with symptoms of abdominal pain after which she will develop hot  flash, nausea and vomiting.  After about a week she begins to feel better.  Previously been seen at [facility] in  [place] for these cycles.  She has had over 6 hospitalizations in the last year for her pain.  Reports she had gastric emptying scan when she was 31 years old showing decreased gastric emptying.  She has previously been told her symptoms may be due to her cannabinoid hyperemesis.  She stopped all marijuana use for 2-1/2 years with no relief of symptoms. Reports EGD last year with findings of gastritis but nothing to explain nausea, vomiting.    She has been in and out of medical care due to insurance coverage.   She notes the inciting incident for her cycles of pain was when she had abdominal surgery for removal of her fallopian tube after she developed PID from an IUD.  Complains of chronic pain.  6 months after her surgery that she developed her first cycle of pain.  Notes her  symptoms have worsened in the last year since she experienced a traumatic event.  Patient with history of bipolar I disorder and PTSD.   Gastric emptying scan 01/13/2016: Mild late phase gastroparesis   No report of EGD on file or care everywhere.   Has not smoked marijuana since 5/9.  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 No pertinent surgical history.  Prior to Admission medications   Medication Sig Start Date End Date Taking? Authorizing Provider  atenolol-chlorthalidone (TENORETIC) 100-25 MG tablet Take 1 tablet by mouth at bedtime.   Yes [provider]  clonazePAM (KLONOPIN) 1  MG tablet Take 1 mg by mouth 2 (two) times daily. 04/17/21  Yes [provider]  lamoTRIgine (LAMICTAL) 200 MG tablet Take 1 tablet (200 mg total) by mouth daily. Patient taking differently: Take 200 mg by mouth at bedtime. 08/14/21  Yes Drema Dallas, MD  OVER THE COUNTER MEDICATION Take 30-60 mLs by mouth 2 (two) times daily as needed (constipation). Dulcolax liquid - unknown ingredients   Yes [provider]  phenol (CHLORASEPTIC) 1.4 % LIQD Use as directed 1 spray in the mouth or throat as needed for throat irritation / pain. 07/22/21  Yes Drema Dallas, MD  QUEtiapine (SEROQUEL) 100 MG tablet Take 100 mg by mouth every morning.   Yes [provider]  QUEtiapine (SEROQUEL) 300 MG tablet Take 300 mg by mouth at bedtime.   Yes [provider]  acetaminophen-codeine (TYLENOL #4) 300-60 MG tablet Take 1 tablet by mouth daily as needed for pain. Patient not taking: Reported on 07/25/2021    [provider]  bisacodyl (DULCOLAX) 10 MG suppository Place 1 suppository (10 mg total) rectally daily as needed for moderate constipation. Patient not taking: Reported on 07/25/2021 07/22/21   Drema Dallas, MD  haloperidol (HALDOL) 5 MG tablet Take 2 tablets (10 mg total) by mouth at bedtime. Patient not taking: Reported on 07/25/2021 04/29/21   Joycelyn Das, MD  lamoTRIgine (LAMICTAL) 100 MG tablet Take 1 tablet (100 mg total) by mouth daily. Patient not taking: Reported on 07/25/2021 07/31/21   Drema Dallas, MD  lamoTRIgine (LAMICTAL) 150 MG tablet Take 1 tablet (150 mg total) by mouth daily. Patient not taking: Reported on 07/25/2021 08/07/21   Drema Dallas, MD  lamoTRIgine (LAMICTAL) 25 MG tablet Take 1 tablet (25 mg total) by mouth daily. Patient not taking: Reported on 07/25/2021 07/23/21   Drema Dallas, MD  lamoTRIgine (LAMICTAL) 25 MG tablet Take 2 tablets (50 mg total) by mouth daily. Patient not taking: Reported on 07/25/2021 07/24/21   Drema Dallas,  MD  metoCLOPramide (REGLAN) 5 MG tablet Take 3 tablets (15 mg total) by mouth 4 (four) times daily -  before meals and at bedtime. Patient not taking: Reported on 07/25/2021 07/22/21   Drema Dallas, MD  pantoprazole (PROTONIX) 40 MG tablet Take 1 tablet (40 mg total) by mouth daily. Patient not taking: Reported on 07/15/2021 04/29/21 04/29/22  Joycelyn Das, MD  promethazine (PHENERGAN) 12.5 MG tablet Take 2 tablets (25 mg total) by mouth every 6 (six) hours as needed for nausea or vomiting. Patient not taking: Reported on 07/15/2021 04/29/21   Joycelyn Das, MD    Scheduled Meds:  bisacodyl  10 mg Oral Daily   feeding supplement  1 Container Oral TID BM   lamoTRIgine  25 mg Oral Daily   Followed by   Melene Muller ON 08/10/2021] lamoTRIgine  50 mg Oral Daily   Followed by   Melene Muller ON  08/24/2021] lamoTRIgine  100 mg Oral Daily   Followed by   Melene Muller[START ON 08/31/2021] lamoTRIgine  200 mg Oral Daily   metoCLOPramide (REGLAN) injection  10 mg Intravenous Q6H   metoprolol tartrate  5 mg Intravenous Q8H   pantoprazole (PROTONIX) IV  40 mg Intravenous Q24H   polyethylene glycol  17 g Oral Daily   QUEtiapine  100 mg Oral q morning   QUEtiapine  300 mg Oral QHS   Continuous Infusions:  0.45 % NaCl with KCl 20 mEq / L 75 mL/hr at 07/29/21 0502   promethazine (PHENERGAN) injection (IM or IVPB) 25 mg (07/29/21 0930)   PRN Meds:.HYDROmorphone (DILAUDID) injection, LORazepam, promethazine (PHENERGAN) injection (IM or IVPB)  Allergies as of 07/24/2021 - Review Complete 07/24/2021  Allergen Reaction Noted   Penicillins  04/25/2021   Amlodipine Hives 11/14/2019   Dicyclomine Other (See Comments) 04/17/2016    No family history on file.  Social History   Socioeconomic History   Marital status: Single    Spouse name: Not on file   Number of children: Not on file   Years of education: Not on file   Highest education level: Not on file  Occupational History   Not on file  Tobacco Use   Smoking  status: Former    Types: Cigarettes   Smokeless tobacco: Never  Vaping Use   Vaping Use: Every day   Substances: Nicotine, Flavoring  Substance and Sexual Activity   Alcohol use: Not Currently   Drug use: Yes    Types: Marijuana   Sexual activity: Yes  Other Topics Concern   Not on file  Social History Narrative   Not on file   Social Determinants of Health   Financial Resource Strain: Not on file  Food Insecurity: Not on file  Transportation Needs: Not on file  Physical Activity: Not on file  Stress: Not on file  Social Connections: Not on file  Intimate Partner Violence: Not on file    Review of Systems: Review of Systems  Constitutional:  Positive for malaise/fatigue. Negative for chills and fever.  HENT:  Negative for hearing loss and tinnitus.   Eyes:  Negative for blurred vision and double vision.  Respiratory:  Negative for cough and hemoptysis.   Cardiovascular:  Negative for chest pain and palpitations.  Gastrointestinal:  Positive for abdominal pain, constipation, nausea and vomiting. Negative for blood in stool, diarrhea, heartburn and melena.  Genitourinary:  Negative for dysuria and urgency.  Musculoskeletal:  Negative for myalgias and neck pain.  Skin:  Negative for itching and rash.  Neurological:  Positive for weakness. Negative for dizziness and headaches.  Endo/Heme/Allergies:  Negative for environmental allergies. Does not bruise/bleed easily.  Psychiatric/Behavioral:  Positive for depression and substance abuse. Negative for suicidal ideas. The patient is nervous/anxious.     Physical Exam:Physical Exam Constitutional:      General: She is not in acute distress.    Appearance: Normal appearance. She is normal weight.  HENT:     Head: Normocephalic and atraumatic.     Right Ear: External ear normal.     Left Ear: External ear normal.     Nose: Nose normal.     Mouth/Throat:     Mouth: Mucous membranes are moist.  Eyes:     Pupils: Pupils are  equal, round, and reactive to light.  Cardiovascular:     Rate and Rhythm: Normal rate and regular rhythm.     Pulses: Normal pulses.  Heart sounds: Normal heart sounds.  Pulmonary:     Effort: Pulmonary effort is normal.     Breath sounds: Normal breath sounds.  Abdominal:     General: Abdomen is flat. Bowel sounds are normal. There is no distension.     Palpations: Abdomen is soft. There is no mass.     Tenderness: There is abdominal tenderness (generalized worsened in umbilical region). There is no guarding or rebound.     Hernia: No hernia is present.  Musculoskeletal:        General: No swelling. Normal range of motion.     Cervical back: Normal range of motion and neck supple.  Skin:    General: Skin is warm and dry.  Neurological:     General: No focal deficit present.     Mental Status: She is alert and oriented to person, place, and time. Mental status is at baseline.  Psychiatric:        Mood and Affect: Mood normal.        Behavior: Behavior normal.    Vital signs: Vitals:   07/29/21 0504 07/29/21 0504  BP: 120/73 120/73  Pulse: 87 86  Resp: 18 18  Temp: 97.8 F (36.6 C) 97.8 F (36.6 C)  SpO2: 96% 98%   Last BM Date :  (pt reports sometime in the last week)    GI:  Lab Results: Recent Labs    07/27/21 0718  WBC 8.2  HGB 12.4  HCT 37.1  PLT 325   BMET Recent Labs    07/27/21 0718  NA 140  K 3.9  CL 109  CO2 25  GLUCOSE 83  BUN 5*  CREATININE 0.77  CALCIUM 8.3*   LFT No results for input(s): PROT, ALBUMIN, AST, ALT, ALKPHOS, BILITOT, BILIDIR, IBILI in the last 72 hours. PT/INR No results for input(s): LABPROT, INR in the last 72 hours.   Studies/Results: No results found.  Impression: Irretractable nausea and vomiting Possible cyclical vomiting syndrome versus cannabinoid hyperemesis Constipation  CT abdomen pelvis with contrast 07/25/2021 No evidence of small bowel obstruction  KUB 07/29/2021 Nonspecific bowel gas  pattern  No evidence of small bowel or large bowel obstruction.  Patient's last cannabis use on 07/15/2021 possible cannabinoid hyperemesis.   Gastric emptying scan 01/13/2016: Mild late phase gastroparesis Unknown etiology  Suspect some psychiatric involvement contributing toward symptoms of nausea and vomiting worsening of symptoms after traumatic episode.  Patient previously noted her psychiatrist also thinks her symptoms are related to trauma.  Patient also seems to anxious in regards to eating food.   Plan: Continue supportive care and antiemetics as needed. Encourage patient to try full liquid if she feels she may tolerate it. Can consider increasing MiraLAX to twice daily for constipation. [facility] will follow.   LOS: 3 days   Emmit Alexanders  PA-C 07/29/2021, 11:44 AM  Contact #  [phone]

## 2021-07-29 NOTE — Progress Notes (Addendum)
PROGRESS NOTE    Deborah Kelly  FIE:[id number] DOB: Jul 13, 1990 DOA: 07/24/2021 PCP: Pcp, No  Brief Narrative: 31 year old female with history of alcohol abuse, bipolar disorder cannabis hyperemesis, morbid obesity, Graves' disease and gastroparesis admitted with nausea and vomiting for the last 2 days.  Her last bowel movement was 2 days ago but she was having diarrhea at that point.  She was just discharged from the hospital 07/22/2021 per her request.  She states she was not ready to be discharged but she requested discharge and she went home. In the ER UA was positive for ketones and proteins normal lipase with a white count of 14.6.  CT abdomen and pelvis showed no evidence of bowel obstruction.  Review of the chart shows that follow-up follows up with GI at [facility] she recently moved to [place].  Gastric emptying study in the past 2017 revealed mild late phase gastroparesis.  Her urine drug screen was positive for THC in the last admission.  She was treated with Reglan high-dose 15 mg.  Assessment & Plan:   Principal Problem:   Intractable vomiting Active Problems:   Gastroparesis   HTN (hypertension)   Bipolar disorder (HCC)   Hypokalemia   Abdominal pain   Cannabis misuse   Class 3 obesity (HCC)   Transaminitis    #1 intractable nausea vomiting in the setting of gastroparesis and cannabis abuse - She reports nausea and GERD symptoms yesterday.  Continue full liquid diet.  Continue Reglan and Phenergan and IV fluids.  Appreciate GI input.    #2 history of essential hypertension blood pressure soft antihypertensives on hold   #3 morbid obesity complicates overall prognosis and needs ongoing counseling and following up with PCP.  #4 hypokalemia resolved   #5 bipolar disorder continue Seroquel and Lamictal she requested to see psych psych consult was placed and she was seen by psych.  No new recommendations.  #6 transaminitis improving   #7 history of Graves' disease  TSH normal not on any supplements.  Estimated body mass index is 45.61 kg/m as calculated from the following:   Height as of this encounter: 5\' 8"  (1.727 m).   Weight as of this encounter: 136.1 kg.  DVT prophylaxis: Lovenox  code Status: Full code Family Communication: None at bedside  disposition Plan:  Status is: Inpatient    Consultants:  None  Procedures: None Antimicrobials: None  Subjective:  She complains of nausea and severe reflux yesterday however she is still willing to try full liquid diet today still having some abdominal pain encourage her to get out of bed and ambulate objective: Vitals:   07/28/21 1828 07/28/21 2011 07/29/21 0504 07/29/21 0504  BP: 115/72 136/87 120/73 120/73  Pulse: 86 88 87 86  Resp: 18 18 18 18   Temp: 98.1 F (36.7 C) 98 F (36.7 C) 97.8 F (36.6 C) 97.8 F (36.6 C)  TempSrc: Oral Oral Oral Oral  SpO2: 99% 100% 96% 98%  Weight:      Height:        Intake/Output Summary (Last 24 hours) at 07/29/2021 1355 Last data filed at 07/29/2021 0502 Gross per 24 hour  Intake 1498.06 ml  Output --  Net 1498.06 ml    Filed Weights   07/24/21 1856  Weight: 136.1 kg    Examination:  General exam: Appears in no acute distress  respiratory system: Clear to auscultation. Respiratory effort normal. Cardiovascular system: S1 & S2 heard, RRR. No JVD, murmurs, rubs, gallops or clicks. No pedal edema.  Gastrointestinal system: Abdomen is nondistended, obese soft and non generalized tender. No organomegaly or masses felt. Normal bowel sounds heard. Central nervous system: Alert and oriented. No focal neurological deficits. Extremities: No edema  skin: No rashes, lesions or ulcers Psychiatry: Judgement and insight appear normal. Mood & affect appropriate.     Data Reviewed: I have personally reviewed following labs and imaging studies  CBC: Recent Labs  Lab 07/24/21 1919 07/25/21 0806 07/26/21 0613 07/27/21 0718  WBC 14.6* 16.2* 11.4*  8.2  HGB 15.4* 14.8 12.5 12.4  HCT 47.0* 43.5 37.4 37.1  MCV 90.4 90.1 90.8 91.4  PLT 424* 383 319 325    Basic Metabolic Panel: Recent Labs  Lab 07/24/21 1919 07/25/21 0806 07/26/21 0613 07/27/21 0718  NA 143 145 140 140  K 3.9 3.4* 3.3* 3.9  CL 110 107 107 109  CO2 23 27 26 25   GLUCOSE 156* 131* 87 83  BUN 10 10 6  5*  CREATININE 1.12* 0.99 0.80 0.77  CALCIUM 9.7 9.3 8.0* 8.3*  MG  --  1.9  --   --   PHOS  --  3.8  --   --     GFR: Estimated Creatinine Clearance: 153.4 mL/min (by C-G formula based on SCr of 0.77 mg/dL). Liver Function Tests: Recent Labs  Lab 07/24/21 1919 07/25/21 0806 07/26/21 0613  AST 45* 33 25  ALT 90* 74* 51*  ALKPHOS 87 82 70  BILITOT 0.6 0.6 0.8  PROT 8.6* 8.0 6.5  ALBUMIN 4.5 4.5 3.8    Recent Labs  Lab 07/24/21 1919  LIPASE 26    No results for input(s): AMMONIA in the last 168 hours. Coagulation Profile: No results for input(s): INR, PROTIME in the last 168 hours. Cardiac Enzymes: No results for input(s): CKTOTAL, CKMB, CKMBINDEX, TROPONINI in the last 168 hours. BNP (last 3 results) No results for input(s): PROBNP in the last 8760 hours. HbA1C: No results for input(s): HGBA1C in the last 72 hours. CBG: No results for input(s): GLUCAP in the last 168 hours. Lipid Profile: No results for input(s): CHOL, HDL, LDLCALC, TRIG, CHOLHDL, LDLDIRECT in the last 72 hours. Thyroid Function Tests: No results for input(s): TSH, T4TOTAL, FREET4, T3FREE, THYROIDAB in the last 72 hours. Anemia Panel: No results for input(s): VITAMINB12, FOLATE, FERRITIN, TIBC, IRON, RETICCTPCT in the last 72 hours. Sepsis Labs: No results for input(s): PROCALCITON, LATICACIDVEN in the last 168 hours.  No results found for this or any previous visit (from the past 240 hour(s)).       Radiology Studies: DG Abd 1 View  Result Date: 07/29/2021 CLINICAL DATA:  Nausea, vomiting EXAM: ABDOMEN - 1 VIEW COMPARISON:  07/21/2021 FINDINGS: Bowel gas pattern  is nonspecific. There is no significant small bowel dilation. Gas is present in colon. There is interval passage of oral contrast seen in the previous study. No abnormal masses or calcifications are seen. IMPRESSION: Nonspecific bowel gas pattern. Electronically Signed   By: 07/31/2021 M.D.   On: 07/29/2021 12:00        Scheduled Meds:  bisacodyl  10 mg Oral Daily   feeding supplement  1 Container Oral TID BM   lamoTRIgine  25 mg Oral Daily   Followed by   Ernie Avena ON 08/10/2021] lamoTRIgine  50 mg Oral Daily   Followed by   Melene Muller ON 08/24/2021] lamoTRIgine  100 mg Oral Daily   Followed by   Melene Muller ON 08/31/2021] lamoTRIgine  200 mg Oral Daily   metoCLOPramide (REGLAN) injection  10 mg Intravenous Q6H   metoprolol tartrate  5 mg Intravenous Q8H   pantoprazole (PROTONIX) IV  40 mg Intravenous Q24H   polyethylene glycol  17 g Oral Daily   QUEtiapine  100 mg Oral q morning   QUEtiapine  300 mg Oral QHS   Continuous Infusions:  0.45 % NaCl with KCl 20 mEq / L 75 mL/hr at 07/29/21 1240   promethazine (PHENERGAN) injection (IM or IVPB) 25 mg (07/29/21 0930)     LOS: 3 days    Time spent: 39 minutes  Alwyn RenElizabeth G Khadejah Son, MD 07/29/2021, 1:55 PM

## 2021-07-30 DIAGNOSIS — F3111 Bipolar disorder, current episode manic without psychotic features, mild: Secondary | ICD-10-CM | POA: Diagnosis not present

## 2021-07-30 DIAGNOSIS — I10 Essential (primary) hypertension: Secondary | ICD-10-CM

## 2021-07-30 DIAGNOSIS — R1110 Vomiting, unspecified: Secondary | ICD-10-CM | POA: Diagnosis not present

## 2021-07-30 LAB — COMPREHENSIVE METABOLIC PANEL
ALT: 24 U/L (ref 0–44)
AST: 13 U/L — ABNORMAL LOW (ref 15–41)
Albumin: 3.3 g/dL — ABNORMAL LOW (ref 3.5–5.0)
Alkaline Phosphatase: 61 U/L (ref 38–126)
Anion gap: 4 — ABNORMAL LOW (ref 5–15)
BUN: <5 mg/dL — ABNORMAL LOW (ref 6–20)
CO2: 27 mmol/L (ref 22–32)
Calcium: 8.6 mg/dL — ABNORMAL LOW (ref 8.9–10.3)
Chloride: 108 mmol/L (ref 98–111)
Creatinine, Ser: 0.94 mg/dL (ref 0.44–1.00)
GFR, Estimated: >60 mL/min (ref >60)
Glucose, Bld: 102 mg/dL — ABNORMAL HIGH (ref 70–99)
Potassium: 4.4 mmol/L (ref 3.5–5.1)
Sodium: 139 mmol/L (ref 135–145)
Total Bilirubin: 0.6 mg/dL (ref 0.3–1.2)
Total Protein: 6 g/dL — ABNORMAL LOW (ref 6.5–8.1)

## 2021-07-30 LAB — CBC
HCT: 37.3 % (ref 36.0–46.0)
Hemoglobin: 12.6 g/dL (ref 12.0–15.0)
MCH: 30.8 pg (ref 26.0–34.0)
MCHC: 33.8 g/dL (ref 30.0–36.0)
MCV: 91.2 fL (ref 80.0–100.0)
Platelets: 316 10*3/uL (ref 150–400)
RBC: 4.09 MIL/uL (ref 3.87–5.11)
RDW: 13.5 % (ref 11.5–15.5)
WBC: 8.3 10*3/uL (ref 4.0–10.5)
nRBC: 0 % (ref 0.0–0.2)

## 2021-07-30 MED ORDER — ATENOLOL 25 MG PO TABS
25.0000 mg | ORAL_TABLET | Freq: Every day | ORAL | Status: DC
  Administered 2021-07-30 – 2021-08-01 (×3): 25 mg via ORAL
  Filled 2021-07-30 (×3): qty 1

## 2021-07-30 MED ORDER — OXYCODONE HCL 5 MG PO TABS
5.0000 mg | ORAL_TABLET | Freq: Four times a day (QID) | ORAL | Status: DC | PRN
  Administered 2021-07-30 – 2021-07-31 (×4): 5 mg via ORAL
  Filled 2021-07-30 (×4): qty 1

## 2021-07-30 MED ORDER — ALUM & MAG HYDROXIDE-SIMETH 200-200-20 MG/5ML PO SUSP
30.0000 mL | Freq: Four times a day (QID) | ORAL | Status: DC | PRN
  Administered 2021-07-30: 30 mL via ORAL
  Filled 2021-07-30: qty 30

## 2021-07-30 MED ORDER — HYDROMORPHONE HCL 1 MG/ML IJ SOLN
0.5000 mg | INTRAMUSCULAR | Status: DC | PRN
  Administered 2021-07-30 – 2021-08-01 (×13): 0.5 mg via INTRAVENOUS
  Filled 2021-07-30 (×14): qty 0.5

## 2021-07-30 NOTE — Progress Notes (Signed)
PROGRESS NOTE    Deborah Kelly  [id number] DOB: 1990/11/22 DOA: 07/24/2021  PCP: Pcp, No   Brief Narrative: 31 year old female with history of alcohol abuse, bipolar disorder cannabis hyperemesis, morbid obesity, Graves' disease and gastroparesis admitted with nausea and vomiting.  She was just discharged from the hospital 07/22/2021 per her request.  She states she was not ready to be discharged but she requested discharge and she went home. CT abdomen and pelvis showed no evidence of bowel obstruction. Review of the chart shows that she follows up with GI at [facility]. She recently moved to [place].  Gastric emptying study in the past 2017 revealed mild late phase gastroparesis.  Her urine drug screen was positive for THC in the last admission.  She was treated with Reglan high-dose 15 mg.   Assessment & Plan:   Intractable nausea vomiting in the setting of gastroparesis and cannabis abuse Thought to be secondary to cannabis abuse.  Seen by gastroenterology.  Plan is for gastric emptying study per gastroenterology notes. Patient noted to be on scheduled Reglan intravenously.  Getting Phenergan as needed. Continue PPI as well.  Essential hypertension  Blood pressure reasonably well controlled.  Noted to be on metoprolol intravenously.   At home she appears to be on atenolol and chlorthalidone.  This is currently on hold.   Resume atenolol.  Bipolar disorder Seen by psychiatry.  Being continued on Lamictal and Seroquel.  Hypokalemia Repleted.  Transaminitis Resolved.  History of Graves' disease TSH was noted to be normal.  Not on any thyroid supplements.  Morbid obesity Estimated body mass index is 45.61 kg/m as calculated from the following:   Height as of this encounter: 5\' 8"  (1.727 m).   Weight as of this encounter: 136.1 kg.  DVT prophylaxis: Lovenox  code Status: Full code Family Communication: None at bedside  disposition Plan:  Status is: Inpatient     Consultants:  None  Procedures: None Antimicrobials: None  Subjective: Complains of nausea.  Has not really had much to eat or drink.  Denies any other complaints at this time.  Objective: Vitals:   07/29/21 0504 07/29/21 1634 07/29/21 2156 07/30/21 0520  BP: 120/73 113/75 104/64 110/78  Pulse: 86 85 86 87  Resp: 18 14 17 17   Temp: 97.8 F (36.6 C) 98 F (36.7 C) 98 F (36.7 C) 97.6 F (36.4 C)  TempSrc: Oral Oral Oral Oral  SpO2: 98% 100% 99% 100%  Weight:      Height:        Intake/Output Summary (Last 24 hours) at 07/30/2021 1018 Last data filed at 07/30/2021 0959 Gross per 24 hour  Intake 2816.31 ml  Output --  Net 2816.31 ml    Filed Weights   07/24/21 1856  Weight: 136.1 kg    Examination:  General appearance: Awake alert.  In no distress Resp: Clear to auscultation bilaterally.  Normal effort Cardio: S1-S2 is normal regular.  No S3-S4.  No rubs murmurs or bruit GI: Abdomen is soft.  Nontender nondistended.  Bowel sounds are present normal.  No masses organomegaly Extremities: No edema.  Full range of motion of lower extremities. Neurologic: Alert and oriented x3.  No focal neurological deficits.    Data Reviewed: I have personally reviewed following labs and imaging studies  CBC: Recent Labs  Lab 07/24/21 1919 07/25/21 0806 07/26/21 0613 07/27/21 0718 07/30/21 0514  WBC 14.6* 16.2* 11.4* 8.2 8.3  HGB 15.4* 14.8 12.5 12.4 12.6  HCT 47.0* 43.5 37.4 37.1 37.3  MCV 90.4 90.1 90.8 91.4 91.2  PLT 424* 383 319 325 [id number]    Basic Metabolic Panel: Recent Labs  Lab 07/24/21 1919 07/25/21 0806 07/26/21 0613 07/27/21 0718 07/30/21 0514  NA 143 145 140 140 139  K 3.9 3.4* 3.3* 3.9 4.4  CL 110 107 107 109 108  CO2 23 27 26 25 27   GLUCOSE 156* 131* 87 83 102*  BUN 10 10 6  5* <5*  CREATININE 1.12* 0.99 0.80 0.77 0.94  CALCIUM 9.7 9.3 8.0* 8.3* 8.6*  MG  --  1.9  --   --   --   PHOS  --  3.8  --   --   --     GFR: Estimated Creatinine  Clearance: 130.5 mL/min (by C-G formula based on SCr of 0.94 mg/dL). Liver Function Tests: Recent Labs  Lab 07/24/21 1919 07/25/21 0806 07/26/21 0613 07/30/21 0514  AST 45* 33 25 13*  ALT 90* 74* 51* 24  ALKPHOS 87 82 70 61  BILITOT 0.6 0.6 0.8 0.6  PROT 8.6* 8.0 6.5 6.0*  ALBUMIN 4.5 4.5 3.8 3.3*    Recent Labs  Lab 07/24/21 1919  LIPASE 26      Radiology Studies: DG Abd 1 View  Result Date: 07/29/2021 CLINICAL DATA:  Nausea, vomiting EXAM: ABDOMEN - 1 VIEW COMPARISON:  07/21/2021 FINDINGS: Bowel gas pattern is nonspecific. There is no significant small bowel dilation. Gas is present in colon. There is interval passage of oral contrast seen in the previous study. No abnormal masses or calcifications are seen. IMPRESSION: Nonspecific bowel gas pattern. Electronically Signed   By: Elmer Picker M.D.   On: 07/29/2021 12:00        Scheduled Meds:  bisacodyl  10 mg Oral Daily   feeding supplement  1 Container Oral TID BM   lamoTRIgine  25 mg Oral Daily   Followed by   Derrill Memo ON 08/10/2021] lamoTRIgine  50 mg Oral Daily   Followed by   Derrill Memo ON 08/24/2021] lamoTRIgine  100 mg Oral Daily   Followed by   Derrill Memo ON 08/31/2021] lamoTRIgine  200 mg Oral Daily   metoCLOPramide (REGLAN) injection  10 mg Intravenous Q6H   metoprolol tartrate  5 mg Intravenous Q8H   pantoprazole (PROTONIX) IV  40 mg Intravenous Q24H   polyethylene glycol  17 g Oral Daily   QUEtiapine  100 mg Oral q morning   QUEtiapine  300 mg Oral QHS   Continuous Infusions:  0.45 % NaCl with KCl 20 mEq / L 75 mL/hr at 07/30/21 0159   promethazine (PHENERGAN) injection (IM or IVPB) 25 mg (07/30/21 0643)     LOS: 4 days    Bonnielee Haff, MD 07/30/2021, 10:18 AM

## 2021-07-30 NOTE — Progress Notes (Signed)
[facility] Progress Note  Deborah Kelly 31 y.o. [birth year]  Subjective: Patient seen and examined laying in bed.  Patient notes.  Continues to have some abdominal pain.  Tolerating small amounts of liquid and some eggs and hashbrowns for breakfast.  No she drinks MiraLAX this morning with apple juice but no bowel movement today.  ROS : Review of Systems  Gastrointestinal:  Positive for abdominal pain, constipation and nausea. Negative for blood in stool, diarrhea, heartburn, melena and vomiting.  Genitourinary:  Negative for dysuria and urgency.     Objective: Vital signs in last 24 hours: Vitals:   07/29/21 2156 07/30/21 0520  BP: 104/64 110/78  Pulse: 86 87  Resp: 17 17  Temp: 98 F (36.7 C) 97.6 F (36.4 C)  SpO2: 99% 100%    Physical Exam:  General:  Alert, cooperative, no distress, appears stated age  Head:  Normocephalic, without obvious abnormality, atraumatic  Eyes:  Anicteric sclera, EOM's intact  Lungs:   Clear to auscultation bilaterally, respirations unlabored  Heart:  Regular rate and rhythm, S1, S2 normal  Abdomen:   Soft, generalized abdominal tenderness, bowel sounds active all four quadrants,  no masses,   Extremities: Extremities normal, atraumatic, no  edema  Pulses: 2+ and symmetric    Lab Results: Recent Labs    07/30/21 0514  NA 139  K 4.4  CL 108  CO2 27  GLUCOSE 102*  BUN <5*  CREATININE 0.94  CALCIUM 8.6*   Recent Labs    07/30/21 0514  AST 13*  ALT 24  ALKPHOS 61  BILITOT 0.6  PROT 6.0*  ALBUMIN 3.3*   Recent Labs    07/30/21 0514  WBC 8.3  HGB 12.6  HCT 37.3  MCV 91.2  PLT 316   No results for input(s): LABPROT, INR in the last 72 hours.    Assessment Irretractable nausea and vomiting Possible cyclical vomiting syndrome versus cannabinoid hyperemesis Constipation   CT abdomen pelvis with contrast 07/25/2021 No evidence of small bowel obstruction   KUB 07/29/2021 Nonspecific bowel gas pattern   No  evidence of small bowel or large bowel obstruction.  Patient's last cannabis use on 07/15/2021 possible cannabinoid hyperemesis.    Gastric emptying scan 01/13/2016: Mild late phase gastroparesis Unknown etiology   Suspect some psychiatric involvement contributing toward symptoms of nausea and vomiting worsening of symptoms after traumatic episode.  Patient previously noted her psychiatrist also thinks her symptoms are related to trauma.  Patient also seems to anxious in regards to eating food.   Patient improved slightly today but able to advance diet to soft foods.  Plan for gastric emptying scan tomorrow.   Plan: Continue supportive care and antiemetics as needed. Continue soft diet Can consider increasing MiraLAX to twice daily for constipation. Gastric emptying scan tomorrow [facility] will follow.   Roseanne Reno Kamirah Shugrue PA-C 07/30/2021, 11:33 AM  Contact #  [phone]

## 2021-07-30 NOTE — TOC Initial Note (Signed)
Transition of Care [facility]) - Initial/Assessment Note    Patient Details  Name: Deborah Kelly MRN: [id number] Date of Birth: Aug 06, 1990  Transition of Care [facility]) CM/SW Contact:    Bartholome Bill, RN Phone Number: 07/30/2021, 3:08 PM  Clinical Narrative:                   Expected Discharge Plan: Home/Self Care Barriers to Discharge: Continued Medical Work up   Patient Goals and CMS Choice Patient states their goals for this hospitalization and ongoing recovery are:: To go home      Expected Discharge Plan and Services Expected Discharge Plan: Home/Self Care   Discharge Planning Services: CM Consult   Living arrangements for the past 2 months: Apartment                   Prior Living Arrangements/Services Living arrangements for the past 2 months: Apartment Lives with:: Self Patient language and need for interpreter reviewed:: Yes        Need for Family Participation in Patient Care: No (Comment) Care giver support system in place?: Yes (comment)   Criminal Activity/Legal Involvement Pertinent to Current Situation/Hospitalization: No - Comment as needed  Activities of Daily Living Home Assistive Devices/Equipment: Eyeglasses ADL Screening (condition at time of admission) Patient's cognitive ability adequate to safely complete daily activities?: Yes Is the patient deaf or have difficulty hearing?: No Does the patient have difficulty seeing, even when wearing glasses/contacts?: No Does the patient have difficulty concentrating, remembering, or making decisions?: No Patient able to express need for assistance with ADLs?: Yes Does the patient have difficulty dressing or bathing?: No Independently performs ADLs?: Yes (appropriate for developmental age) Does the patient have difficulty walking or climbing stairs?: Yes (due to pain) Weakness of Legs: Both Weakness of Arms/Hands: Both    Orientation: : Oriented to Self, Oriented to Place, Oriented to  Time, Oriented to  Situation Alcohol / Substance Use: Not Applicable Psych Involvement: No (comment)  Admission diagnosis:  Gastroparesis [K31.84] Intractable vomiting [R11.10] Intractable nausea and vomiting [R11.2] Patient Active Problem List   Diagnosis Date Noted   Transaminitis 07/25/2021   Cannabinoid hyperemesis syndrome 07/17/2021   Abdominal pain 07/15/2021   Hypocalcemia 07/15/2021   Hypermagnesemia 07/15/2021   Intractable vomiting 04/27/2021   Gastroparesis 04/26/2021   Marijuana abuse 04/26/2021   HTN (hypertension) 04/26/2021   Bipolar disorder (HCC) 04/26/2021   Hypokalemia 04/26/2021   Cannabis misuse 03/15/2021   Elevated prolactin level 03/15/2021   Irritable bowel syndrome 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 03/15/2021   Chronic pelvic pain in female 07/08/2020   Dysmenorrhea 07/08/2020   Alcohol abuse 05/19/2017   History of suicide attempt 05/18/2017   Suicidal ideations 05/18/2017   Thrombocytosis 04/17/2016   Class 3 obesity (HCC) 01/11/2016   Leukocytosis 12/02/2015   Anxiety 12/02/2015   S/P laparoscopy 10/27/2015   Bipolar affective disorder, depressed (HCC) 01/20/2012   Graves disease 08/25/2011   Hyperthyroidism 08/20/2011   PCP:  Oneita Hurt, No Pharmacy:   [id number] - [place], [place] - [address] [place] [address] Phone: [phone] Fax: [phone]     Social Determinants of Health (SDOH) Interventions    Readmission Risk Interventions    07/30/2021    3:01 PM  Readmission Risk Prevention Plan  Transportation Screening Complete  PCP or Specialist Appt within 3-5 Days Not Complete  Not Complete comments Pt needs to establish new PCP in [place]. Has insurance  HRI or Home Care Consult Complete  Social Work Consult for Recovery Care Planning/Counseling Not Complete  SW consult not completed comments NA  Palliative Care Screening Not Applicable  Medication Review [employer]) Complete

## 2021-07-31 ENCOUNTER — Inpatient Hospital Stay (HOSPITAL_COMMUNITY): Payer: No Typology Code available for payment source

## 2021-07-31 DIAGNOSIS — F3111 Bipolar disorder, current episode manic without psychotic features, mild: Secondary | ICD-10-CM | POA: Diagnosis not present

## 2021-07-31 DIAGNOSIS — I10 Essential (primary) hypertension: Secondary | ICD-10-CM | POA: Diagnosis not present

## 2021-07-31 DIAGNOSIS — R1110 Vomiting, unspecified: Secondary | ICD-10-CM | POA: Diagnosis not present

## 2021-07-31 MED ORDER — PROMETHAZINE HCL 12.5 MG PO TABS: 25.0000 mg | ORAL_TABLET | Freq: Four times a day (QID) | ORAL | 0 refills | Status: DC | PRN

## 2021-07-31 MED ORDER — ACETAMINOPHEN-CODEINE 300-30 MG PO TABS
1.0000 | ORAL_TABLET | Freq: Four times a day (QID) | ORAL | Status: DC | PRN
  Administered 2021-07-31 – 2021-08-01 (×3): 1 via ORAL
  Filled 2021-07-31 (×3): qty 1

## 2021-07-31 MED ORDER — TECHNETIUM TC 99M SULFUR COLLOID
2.2000 | Freq: Once | INTRAVENOUS | Status: AC | PRN
  Administered 2021-07-31: 2.2 via INTRAVENOUS

## 2021-07-31 MED ORDER — ACETAMINOPHEN-CODEINE 300-60 MG PO TABS: 1.0000 | ORAL_TABLET | Freq: Four times a day (QID) | ORAL | Status: DC | PRN

## 2021-07-31 MED ORDER — ERYTHROMYCIN BASE 250 MG PO TABS
250.0000 mg | ORAL_TABLET | Freq: Three times a day (TID) | ORAL | Status: DC
  Administered 2021-07-31 – 2021-08-01 (×2): 250 mg via ORAL
  Filled 2021-07-31 (×3): qty 1

## 2021-07-31 MED ORDER — PANTOPRAZOLE SODIUM 40 MG PO TBEC
40.0000 mg | DELAYED_RELEASE_TABLET | Freq: Every day | ORAL | Status: DC
  Administered 2021-07-31 – 2021-08-01 (×2): 40 mg via ORAL
  Filled 2021-07-31 (×2): qty 1

## 2021-07-31 MED ORDER — SACCHAROMYCES BOULARDII 250 MG PO CAPS
250.0000 mg | ORAL_CAPSULE | Freq: Two times a day (BID) | ORAL | Status: DC
  Administered 2021-07-31 – 2021-08-01 (×2): 250 mg via ORAL
  Filled 2021-07-31 (×2): qty 1

## 2021-07-31 NOTE — Progress Notes (Signed)
PROGRESS NOTE    Deborah Kelly  [id number] DOB: February 01, 1991 DOA: 07/24/2021  PCP: Pcp, No   Brief Narrative: 31 year old female with history of alcohol abuse, bipolar disorder cannabis hyperemesis, morbid obesity, Graves' disease and gastroparesis admitted with nausea and vomiting.  She was just discharged from the hospital 07/22/2021 per her request.  She states she was not ready to be discharged but she requested discharge and she went home. CT abdomen and pelvis showed no evidence of bowel obstruction. Review of the chart shows that she follows up with GI at [facility]. She recently moved to [place].  Gastric emptying study in the past 2017 revealed mild late phase gastroparesis.  Her urine drug screen was positive for THC in the last admission.  She was treated with Reglan.   Assessment & Plan:   Intractable nausea vomiting in the setting of gastroparesis and cannabis abuse Thought to be secondary to cannabis abuse.  Seen by gastroenterology.   Patient noted to be on scheduled Reglan intravenously.  Getting Phenergan as needed. Continue PPI as well. Plan is for gastric emptying study today.  It was noted yesterday the patient was asking for escalating doses of pain medications.  She has had 3 CT scans in the last few weeks none of which have shown any acute abnormality in the abdominal cavity.  She mentions pain in the abdomen.  She was told that she we will not increase the dose of her pain medications.  Continue to monitor for now.  Essential hypertension  Blood pressure well controlled.  Continue atenolol.  Chlorthalidone is on hold.  Bipolar disorder Seen by psychiatry.  Being continued on Lamictal and Seroquel.  Hypokalemia Repleted.  Transaminitis Resolved.  History of Graves' disease TSH was noted to be normal.  Not on any thyroid supplements.  Morbid obesity Estimated body mass index is 45.61 kg/m as calculated from the following:   Height as of this encounter:  5\' 8"  (1.727 m).   Weight as of this encounter: 136.1 kg.  DVT prophylaxis: Lovenox  code Status: Full code Family Communication: None at bedside  disposition Plan:  Status is: Inpatient    Consultants:  None  Procedures: None Antimicrobials: None  Subjective: Per nursing staff patient has been tolerating her diet.  She does report some nausea.  She mentions that her chronic abdominal pain is secondary to severe pelvic inflammatory disease that she was diagnosed with several years ago.  Objective: Vitals:   07/30/21 0520 07/30/21 1410 07/30/21 2043 07/31/21 0543  BP: 110/78 122/81 115/71 123/62  Pulse: 87 87 92 92  Resp: 17 16 18 18   Temp: 97.6 F (36.4 C) 97.9 F (36.6 C) 98.6 F (37 C) 98 F (36.7 C)  TempSrc: Oral Oral Oral Oral  SpO2: 100% 100% 100% 96%  Weight:      Height:        Intake/Output Summary (Last 24 hours) at 07/31/2021 0912 Last data filed at 07/30/2021 1056 Gross per 24 hour  Intake 485.6 ml  Output --  Net 485.6 ml    Filed Weights   07/24/21 1856  Weight: 136.1 kg    Examination: General appearance: Awake alert.  In no distress Resp: Clear to auscultation bilaterally.  Normal effort Cardio: S1-S2 is normal regular.  No S3-S4.  No rubs murmurs or bruit GI: Abdomen is soft.  Mildly tender in the epigastric area without any rebound or guarding. Extremities: No edema.  Full range of motion of lower extremities. Neurologic: Alert and oriented x3.  No focal neurological deficits.     Data Reviewed: I have personally reviewed following labs and imaging studies  CBC: Recent Labs  Lab 07/24/21 1919 07/25/21 0806 07/26/21 0613 07/27/21 0718 07/30/21 0514  WBC 14.6* 16.2* 11.4* 8.2 8.3  HGB 15.4* 14.8 12.5 12.4 12.6  HCT 47.0* 43.5 37.4 37.1 37.3  MCV 90.4 90.1 90.8 91.4 91.2  PLT 424* 383 319 325 316    Basic Metabolic Panel: Recent Labs  Lab 07/24/21 1919 07/25/21 0806 07/26/21 0613 07/27/21 0718 07/30/21 0514  NA 143 145 140  140 139  K 3.9 3.4* 3.3* 3.9 4.4  CL 110 107 107 109 108  CO2 23 27 26 25 27   GLUCOSE 156* 131* 87 83 102*  BUN 10 10 6  5* <5*  CREATININE 1.12* 0.99 0.80 0.77 0.94  CALCIUM 9.7 9.3 8.0* 8.3* 8.6*  MG  --  1.9  --   --   --   PHOS  --  3.8  --   --   --     GFR: Estimated Creatinine Clearance: 130.5 mL/min (by C-G formula based on SCr of 0.94 mg/dL). Liver Function Tests: Recent Labs  Lab 07/24/21 1919 07/25/21 0806 07/26/21 0613 07/30/21 0514  AST 45* 33 25 13*  ALT 90* 74* 51* 24  ALKPHOS 87 82 70 61  BILITOT 0.6 0.6 0.8 0.6  PROT 8.6* 8.0 6.5 6.0*  ALBUMIN 4.5 4.5 3.8 3.3*    Recent Labs  Lab 07/24/21 1919  LIPASE 26      Radiology Studies: DG Abd 1 View  Result Date: 07/29/2021 CLINICAL DATA:  Nausea, vomiting EXAM: ABDOMEN - 1 VIEW COMPARISON:  07/21/2021 FINDINGS: Bowel gas pattern is nonspecific. There is no significant small bowel dilation. Gas is present in colon. There is interval passage of oral contrast seen in the previous study. No abnormal masses or calcifications are seen. IMPRESSION: Nonspecific bowel gas pattern. Electronically Signed   By: 07/31/2021 M.D.   On: 07/29/2021 12:00        Scheduled Meds:  atenolol  25 mg Oral Daily   bisacodyl  10 mg Oral Daily   feeding supplement  1 Container Oral TID BM   lamoTRIgine  25 mg Oral Daily   Followed by   Ernie Avena ON 08/10/2021] lamoTRIgine  50 mg Oral Daily   Followed by   Melene Muller ON 08/24/2021] lamoTRIgine  100 mg Oral Daily   Followed by   Melene Muller ON 08/31/2021] lamoTRIgine  200 mg Oral Daily   pantoprazole (PROTONIX) IV  40 mg Intravenous Q24H   polyethylene glycol  17 g Oral Daily   QUEtiapine  100 mg Oral q morning   QUEtiapine  300 mg Oral QHS   Continuous Infusions:  0.45 % NaCl with KCl 20 mEq / L 75 mL/hr at 07/30/21 1641   promethazine (PHENERGAN) injection (IM or IVPB) 25 mg (07/31/21 0347)     LOS: 5 days    08/01/21, MD 07/31/2021, 9:12 AM

## 2021-07-31 NOTE — Plan of Care (Signed)
°  Problem: Coping: °Goal: Level of anxiety will decrease °Outcome: Progressing °  °

## 2021-07-31 NOTE — Progress Notes (Signed)
[facility] Progress Note  Deborah Kelly 31 y.o. 26-Sep-1990   Subjective: Patient seen and examined laying in bed and in nuclear medicine while receiving gastric emptying scan.  Patient notes she continues to have some abdominal pain.  Continues to have nausea.  Able to eat eggs without vomiting for gastric emptying scan.  Patient mentions she would like to go home.  ROS : Review of Systems  Gastrointestinal:  Positive for abdominal pain and nausea. Negative for blood in stool, constipation, diarrhea, heartburn, melena and vomiting.  Genitourinary:  Negative for dysuria and urgency.     Objective: Vital signs in last 24 hours: Vitals:   07/31/21 0543 07/31/21 1357  BP: 123/62 116/64  Pulse: 92 80  Resp: 18   Temp: 98 F (36.7 C) 98 F (36.7 C)  SpO2: 96% 98%    Physical Exam:  General:  Alert, cooperative, no distress, appears stated age  Head:  Normocephalic, without obvious abnormality, atraumatic  Eyes:  Anicteric sclera, EOM's intact  Lungs:   Clear to auscultation bilaterally, respirations unlabored  Heart:  Regular rate and rhythm, S1, S2 normal  Abdomen:   Soft, generalized tenderness, bowel sounds active all four quadrants,  no masses,   Extremities: Extremities normal, atraumatic, no  edema  Pulses: 2+ and symmetric    Lab Results: Recent Labs    07/30/21 0514  NA 139  K 4.4  CL 108  CO2 27  GLUCOSE 102*  BUN <5*  CREATININE 0.94  CALCIUM 8.6*   Recent Labs    07/30/21 0514  AST 13*  ALT 24  ALKPHOS 61  BILITOT 0.6  PROT 6.0*  ALBUMIN 3.3*   Recent Labs    07/30/21 0514  WBC 8.3  HGB 12.6  HCT 37.3  MCV 91.2  PLT 316   No results for input(s): LABPROT, INR in the last 72 hours.    Assessment Irretractable nausea and vomiting Possible cyclical vomiting syndrome versus cannabinoid hyperemesis Constipation   CT abdomen pelvis with contrast 07/25/2021 No evidence of small bowel obstruction   KUB 07/29/2021 Nonspecific  bowel gas pattern   No evidence of small bowel or large bowel obstruction.  Patient's last cannabis use on 07/15/2021 possible cannabinoid hyperemesis.    Gastric emptying scan 01/13/2016: Mild late phase gastroparesis Unknown etiology  Nuclear medicine gastric emptying scan 07/31/2021 Markedly delayed gastric emptying, 33% emptied at 4 hours normal is greater than 90%. Of note patient was on narcotic medication while receiving emptying scan   Patient able to tolerate eating soft foods today.  Suspect narcotic and psychiatric medications increasing her gastroparesis.    Plan: We will trial erythromycin 250 mg 30 minutes before meals.  Patient may continue at home if improving can continue medication. We will follow-up outpatient [facility] will sign off  Charlott Rakes PA-C 07/31/2021, 3:08 PM  Contact #  [phone]

## 2021-08-01 DIAGNOSIS — K3184 Gastroparesis: Secondary | ICD-10-CM

## 2021-08-01 LAB — BASIC METABOLIC PANEL
Anion gap: 6 (ref 5–15)
BUN: 9 mg/dL (ref 6–20)
CO2: 25 mmol/L (ref 22–32)
Calcium: 8.6 mg/dL — ABNORMAL LOW (ref 8.9–10.3)
Chloride: 106 mmol/L (ref 98–111)
Creatinine, Ser: 1.14 mg/dL — ABNORMAL HIGH (ref 0.44–1.00)
GFR, Estimated: >60 mL/min (ref >60)
Glucose, Bld: 108 mg/dL — ABNORMAL HIGH (ref 70–99)
Potassium: 3.8 mmol/L (ref 3.5–5.1)
Sodium: 137 mmol/L (ref 135–145)

## 2021-08-01 MED ORDER — SACCHAROMYCES BOULARDII 250 MG PO CAPS
250.0000 mg | ORAL_CAPSULE | Freq: Two times a day (BID) | ORAL | 0 refills | Status: AC
Start: 2021-08-01 — End: 2021-08-15

## 2021-08-01 MED ORDER — ACETAMINOPHEN-CODEINE 300-60 MG PO TABS: 1.0000 | ORAL_TABLET | Freq: Three times a day (TID) | ORAL | 0 refills | Status: DC | PRN

## 2021-08-01 MED ORDER — POLYETHYLENE GLYCOL 3350 17 G PO PACK: 17.0000 g | PACK | Freq: Every day | ORAL | 0 refills | Status: AC

## 2021-08-01 MED ORDER — ERYTHROMYCIN BASE 250 MG PO TABS: 250.0000 mg | ORAL_TABLET | Freq: Three times a day (TID) | ORAL | 2 refills | Status: DC

## 2021-08-01 MED ORDER — BISACODYL 5 MG PO TBEC: 10.0000 mg | DELAYED_RELEASE_TABLET | Freq: Every day | ORAL | 0 refills | Status: AC

## 2021-08-01 NOTE — Progress Notes (Signed)
24 hour chart audit completed 

## 2021-08-01 NOTE — Discharge Summary (Signed)
[facility]  Physician Discharge Summary   Patient ID: Deborah Kelly MRN: [id number] DOB/AGE: May 14, 1990 31 y.o.  Admit date: 07/24/2021 Discharge date: 08/01/2021    PCP: Pcp, No  DISCHARGE DIAGNOSES:  Principal Problem:   Gastroparesis Active Problems:   HTN (hypertension)   Bipolar disorder (HCC)   Hypokalemia   Intractable vomiting   Abdominal pain   Cannabis misuse   Class 3 obesity (HCC)   Transaminitis   RECOMMENDATIONS FOR OUTPATIENT FOLLOW UP: Patient to follow-up with [facility] gastroenterology in 2 weeks.  They will set it up.   Home Health: None Equipment/Devices: None  CODE STATUS: Full code  DISCHARGE CONDITION: fair  Diet recommendation: Regular diet as tolerated  INITIAL HISTORY: 31 year old female with history of alcohol abuse, bipolar disorder cannabis hyperemesis, morbid obesity, Graves' disease and gastroparesis admitted with nausea and vomiting.  She was just discharged from the hospital 07/22/2021 per her request.  She states she was not ready to be discharged but she requested discharge and she went home. CT abdomen and pelvis showed no evidence of bowel obstruction. Review of the chart shows that she follows up with GI at [facility]. She recently moved to [place].  Gastric emptying study in the past 2017 revealed mild late phase gastroparesis.  Her urine drug screen was positive for THC in the last admission.  She was treated with Reglan.  Consultations: [facility]  Procedures: None    HOSPITAL COURSE:   Gastroparesis/intractable nausea vomiting/cannabis abuse Thought to be secondary to cannabis abuse.  Seen by gastroenterology.  Underwent gastric emptying study which showed significant impairment in gastric emptying.  Etiology is unclear.  Narcotics could be contributing.  Seen by gastroenterology and started on erythromycin.  Patient has tolerated her diet in the last 48 hours. She has had 3 CT scans in the last few  weeks none of which have shown any acute abnormality in the abdominal cavity.   If she continues to have symptoms then may need to be referred to [facility].  This will be deferred to gastroenterology.   Essential hypertension  Continue home medication   Bipolar disorder Seen by psychiatry.  Being continued on Lamictal and Seroquel.   Hypokalemia Repleted.   Transaminitis Resolved.  History of Graves' disease TSH was noted to be normal.  Not on any thyroid supplements.   Morbid obesity Estimated body mass index is 45.61 kg/m as calculated from the following:   Height as of this encounter: 5\' 8"  (1.727 m).   Weight as of this encounter: 136.1 kg.   Patient stable.  Tolerating a diet.  Okay for discharge home today.   PERTINENT LABS:  The results of significant diagnostics from this hospitalization (including imaging, microbiology, ancillary and laboratory) are listed below for reference.     Labs:    Basic Metabolic Panel: Recent Labs  Lab 07/27/21 0718 07/30/21 0514 08/01/21 0708  NA 140 139 137  K 3.9 4.4 3.8  CL 109 108 106  CO2 25 27 25   GLUCOSE 83 102* 108*  BUN 5* <5* 9  CREATININE 0.77 0.94 1.14*  CALCIUM 8.3* 8.6* 8.6*   Liver Function Tests: Recent Labs  Lab 07/30/21 0514  AST 13*  ALT 24  ALKPHOS 61  BILITOT 0.6  PROT 6.0*  ALBUMIN 3.3*    CBC: Recent Labs  Lab 07/27/21 0718 07/30/21 0514  WBC 8.2 8.3  HGB 12.4 12.6  HCT 37.1 37.3  MCV 91.4 91.2  PLT 325 316    IMAGING  STUDIES DG Abd 1 View  Result Date: 07/29/2021 CLINICAL DATA:  Nausea, vomiting EXAM: ABDOMEN - 1 VIEW COMPARISON:  07/21/2021 FINDINGS: Bowel gas pattern is nonspecific. There is no significant small bowel dilation. Gas is present in colon. There is interval passage of oral contrast seen in the previous study. No abnormal masses or calcifications are seen. IMPRESSION: Nonspecific bowel gas pattern. Electronically Signed   By: Elmer Picker M.D.    On: 07/29/2021 12:00   DG Abd 1 View  Result Date: 07/16/2021 CLINICAL DATA:  Abdominal pain. EXAM: ABDOMEN - 1 VIEW COMPARISON:  CT abdomen pelvis 04/26/2021. FINDINGS: Mild gaseous prominence of a segment of proximal transverse colon. Otherwise, no small bowel or colonic dilatation. No unexpected radiopaque calculi. IMPRESSION: No acute findings. Electronically Signed   By: Lorin Picket M.D.   On: 07/16/2021 16:14   NM GASTRIC EMPTYING  Result Date: 07/31/2021 CLINICAL DATA:  Chronic nausea and vomiting, abdominal pain, prior abnormal gastric emptying study about 10 years ago, not diabetic EXAM: NUCLEAR MEDICINE GASTRIC EMPTYING SCAN TECHNIQUE: After oral ingestion of radiolabeled meal, sequential abdominal images were obtained for 4 hours. Percentage of activity emptying the stomach was calculated at 1 hour, 2 hour, 3 hour, and 4 hours. RADIOPHARMACEUTICALS:  2.2 mCi Tc-25m sulfur colloid in standardized meal COMPARISON:  None available FINDINGS: Expected location of the stomach in the left upper quadrant. Ingested meal empties the stomach poorly over the course of the study. 6% emptied at 1 hr ( normal >= 10%) 15% emptied at 2 hr ( normal >= 40%) 24% emptied at 3 hr ( normal >= 70%) 33% emptied at 4 hr ( normal >= 90%) IMPRESSION: Markedly delayed gastric emptying. Electronically Signed   By: Lavonia Dana M.D.   On: 07/31/2021 12:42   CT Abdomen Pelvis W Contrast  Result Date: 07/25/2021 CLINICAL DATA:  Abdominal pain with elevated white blood cell count EXAM: CT ABDOMEN AND PELVIS WITH CONTRAST TECHNIQUE: Multidetector CT imaging of the abdomen and pelvis was performed using the standard protocol following bolus administration of intravenous contrast. RADIATION DOSE REDUCTION: This exam was performed according to the departmental dose-optimization program which includes automated exposure control, adjustment of the mA and/or kV according to patient size and/or use of iterative reconstruction  technique. CONTRAST:  135mL OMNIPAQUE IOHEXOL 300 MG/ML  SOLN COMPARISON:  CT from 07/17/2021, plain film from 07/21/2021 FINDINGS: Lower chest: No acute abnormality. Hepatobiliary: No focal liver abnormality is seen. No gallstones, gallbladder wall thickening, or biliary dilatation. Pancreas: Unremarkable. No pancreatic ductal dilatation or surrounding inflammatory changes. Spleen: Normal in size without focal abnormality. Adrenals/Urinary Tract: Adrenal glands are within normal limits. Kidneys demonstrate a normal enhancement pattern bilaterally. No renal calculi or obstructive changes are seen. The bladder is partially distended. Stomach/Bowel: Colon is predominately decompressed. The appendix is within normal limits. No obstructive changes are seen. The small bowel shows no dilatation. Stomach is within normal limits. Vascular/Lymphatic: No significant atherosclerotic changes are noted. A few small right lower quadrant lymph nodes are seen slightly smaller than that noted on the prior exam. No new lymphadenopathy is noted. Reproductive: Status post hysterectomy. No adnexal masses. Other: No abdominal wall hernia or abnormality. No abdominopelvic ascites. Musculoskeletal: No acute or significant osseous findings. IMPRESSION: No evidence of small-bowel obstruction. Scattered small right lower quadrant lymph nodes are seen slightly decreased in size when compared with the prior exam. Electronically Signed   By: Inez Catalina M.D.   On: 07/25/2021 02:44   CT ABDOMEN PELVIS  W CONTRAST  Result Date: 07/17/2021 CLINICAL DATA:  Abdominal pain off and on for 3 months with nausea and vomiting. EXAM: CT ABDOMEN AND PELVIS WITH CONTRAST TECHNIQUE: Multidetector CT imaging of the abdomen and pelvis was performed using the standard protocol following bolus administration of intravenous contrast. RADIATION DOSE REDUCTION: This exam was performed according to the departmental dose-optimization program which includes  automated exposure control, adjustment of the mA and/or kV according to patient size and/or use of iterative reconstruction technique. CONTRAST:  172mL OMNIPAQUE IOHEXOL 300 MG/ML  SOLN COMPARISON:  04/26/2021 FINDINGS: Lower chest: Subsegmental atelectasis noted in the dependent lung bases. Hepatobiliary: No suspicious focal abnormality within the liver parenchyma. There is no evidence for gallstones, gallbladder wall thickening, or pericholecystic fluid. No intrahepatic or extrahepatic biliary dilation. Pancreas: No focal mass lesion. No dilatation of the main duct. No intraparenchymal cyst. No peripancreatic edema. Spleen: No splenomegaly. No focal mass lesion. Adrenals/Urinary Tract: No adrenal nodule or mass. Kidneys unremarkable. No evidence for hydroureter. The urinary bladder appears normal for the degree of distention. Stomach/Bowel: Stomach is unremarkable. No gastric wall thickening. No evidence of outlet obstruction. Duodenum is normally positioned as is the ligament of Treitz. No small bowel wall thickening. No small bowel dilatation. The terminal ileum is normal. The appendix is normal. No gross colonic mass. No colonic wall thickening. Vascular/Lymphatic: No abdominal aortic aneurysm. No abdominal aortic atherosclerotic calcification. 1.7 x 1.8 cm nodal conglomeration in the ileocolic mesentery noted on image 42/2. This is oriented differently than on the prior study due to mobility of the mesentery, but overall is unchanged when comparing axial imaging today to coronal imaging previously. Additional upper normal lymph nodes in the ileocolic mesentery are similar to prior. No retroperitoneal lymphadenopathy. No pelvic sidewall lymphadenopathy. Reproductive: The uterus is unremarkable.  There is no adnexal mass. Other: No intraperitoneal free fluid. Musculoskeletal: No worrisome lytic or sclerotic osseous abnormality. IMPRESSION: No acute findings in the abdomen or pelvis. Specifically, no findings to  explain the patient's history of abdominal pain with nausea and vomiting. Borderline to mild lymphadenopathy in the ileocolic mesentery is stable in the 3 month interval since prior study, most suggestive of benign/reactive etiology. Follow-up CT in 3-6 months could be used to ensure continued stability as clinically warranted. Electronically Signed   By: Misty Stanley M.D.   On: 07/17/2021 15:27   DG Abd Portable 1V-Small Bowel Protocol-Position Verification  Result Date: 07/20/2021 CLINICAL DATA:  NG tube placement EXAM: PORTABLE ABDOMEN - 1 VIEW COMPARISON:  07/16/2021 FINDINGS: Distal portion of enteric tube is coiled within the stomach with its tip pointing cephalad in the fundus. Bowel gas pattern in the upper abdomen is unremarkable. Lower abdomen and pelvis are not included in the image. IMPRESSION: Tip of enteric tube is seen in the fundus of the stomach. Electronically Signed   By: Elmer Picker M.D.   On: 07/20/2021 15:05   DG Abd Portable 1V-Small Bowel Obstruction Protocol-initial, 8 hr delay  Result Date: 07/21/2021 CLINICAL DATA:  Small-bowel follow-through 8 hour follow-up, initial encounter EXAM: PORTABLE ABDOMEN - 1 VIEW COMPARISON:  Film from earlier in the same day. FINDINGS: Gastric catheter is noted within the stomach. Contrast material is noted within the colon. No obstructive changes are seen. IMPRESSION: No evidence of small-bowel obstruction. Contrast administered now lies within the colon. Electronically Signed   By: Inez Catalina M.D.   On: 07/21/2021 00:24    DISCHARGE EXAMINATION: Vitals:   07/31/21 0543 07/31/21 1357 07/31/21 2100 08/01/21 [id number]  BP: 123/62 116/64 117/76 129/84  Pulse: 92 80 89 (!) 103  Resp: 18  18 18   Temp: 98 F (36.7 C) 98 F (36.7 C) 99.2 F (37.3 C) 98.3 F (36.8 C)  TempSrc: Oral Oral Oral Oral  SpO2: 96% 98% 97% 96%  Weight:      Height:       General appearance: Awake alert.  In no distress Resp: Clear to auscultation  bilaterally.  Normal effort Cardio: S1-S2 is normal regular.  No S3-S4.  No rubs murmurs or bruit GI: Abdomen is soft.  Nontender nondistended.  Bowel sounds are present normal.  No masses organomegaly   DISPOSITION: Home  Discharge Instructions     Call MD for:  difficulty breathing, headache or visual disturbances   Complete by: As directed    Call MD for:  extreme fatigue   Complete by: As directed    Call MD for:  persistant dizziness or light-headedness   Complete by: As directed    Call MD for:  persistant nausea and vomiting   Complete by: As directed    Call MD for:  severe uncontrolled pain   Complete by: As directed    Call MD for:  temperature >100.4   Complete by: As directed    Diet - low sodium heart healthy   Complete by: As directed    Discharge instructions   Complete by: As directed    Please take your medications as prescribed.  Please be sure to follow-up with the Dr. Perley Jain office in the next couple of weeks to see how the new medication is working out.  If your symptoms do not improve you may need to be referred to a tertiary health center such as [facility] or [facility].  You were cared for by a hospitalist during your hospital stay. If you have any questions about your discharge medications or the care you received while you were in the hospital after you are discharged, you can call the unit and asked to speak with the hospitalist on call if the hospitalist that took care of you is not available. Once you are discharged, your primary care physician will handle any further medical issues. Please note that NO REFILLS for any discharge medications will be authorized once you are discharged, as it is imperative that you return to your primary care physician (or establish a relationship with a primary care physician if you do not have one) for your aftercare needs so that they can reassess your need for medications and monitor your lab values. If you do  not have a primary care physician, you can call [phone] for a physician referral.   Increase activity slowly   Complete by: As directed           Allergies as of 08/01/2021       Reactions   Penicillins Swelling, Rash   Throat swells   Amlodipine Hives   Dicyclomine Rash, Other (See Comments)   Pt states it makes her hands shake;         Medication List     STOP taking these medications    bisacodyl 10 MG suppository Commonly known as: DULCOLAX Replaced by: bisacodyl 5 MG EC tablet   haloperidol 5 MG tablet Commonly known as: HALDOL   metoCLOPramide 5 MG tablet Commonly known as: REGLAN       TAKE these medications    acetaminophen-codeine 300-60 MG tablet Commonly known as: TYLENOL #4 Take 1 tablet by  mouth every 8 (eight) hours as needed. What changed:  when to take this reasons to take this   atenolol-chlorthalidone 100-25 MG tablet Commonly known as: TENORETIC Take 1 tablet by mouth at bedtime.   bisacodyl 5 MG EC tablet Commonly known as: DULCOLAX Take 2 tablets (10 mg total) by mouth daily. Replaces: bisacodyl 10 MG suppository   clonazePAM 1 MG tablet Commonly known as: KLONOPIN Take 1 mg by mouth 2 (two) times daily.   erythromycin 250 MG tablet Commonly known as: E-MYCIN Take 1 tablet (250 mg total) by mouth 3 (three) times daily before meals.   lamoTRIgine 25 MG tablet Commonly known as: LAMICTAL Take 1 tablet (25 mg total) by mouth daily. What changed: Another medication with the same name was changed. Make sure you understand how and when to take each.   lamoTRIgine 25 MG tablet Commonly known as: LAMICTAL Take 2 tablets (50 mg total) by mouth daily. What changed: Another medication with the same name was changed. Make sure you understand how and when to take each.   lamoTRIgine 100 MG tablet Commonly known as: LAMICTAL Take 1 tablet (100 mg total) by mouth daily. What changed: Another medication with the same name was  changed. Make sure you understand how and when to take each.   lamoTRIgine 150 MG tablet Commonly known as: LAMICTAL Take 1 tablet (150 mg total) by mouth daily. Start taking on: August 07, 2021 What changed: Another medication with the same name was changed. Make sure you understand how and when to take each.   lamoTRIgine 200 MG tablet Commonly known as: LAMICTAL Take 1 tablet (200 mg total) by mouth daily. Start taking on: August 14, 2021 What changed: when to take this   [place] Take 30-60 mLs by mouth 2 (two) times daily as needed (constipation). Dulcolax liquid - unknown ingredients   pantoprazole 40 MG tablet Commonly known as: Protonix Take 1 tablet (40 mg total) by mouth daily.   phenol 1.4 % Liqd Commonly known as: CHLORASEPTIC Use as directed 1 spray in the mouth or throat as needed for throat irritation / pain.   polyethylene glycol 17 g packet Commonly known as: MIRALAX / GLYCOLAX Take 17 g by mouth daily.   promethazine 12.5 MG tablet Commonly known as: PHENERGAN Take 2 tablets (25 mg total) by mouth every 6 (six) hours as needed for nausea or vomiting.   QUEtiapine 300 MG tablet Commonly known as: SEROQUEL Take 300 mg by mouth at bedtime.   QUEtiapine 100 MG tablet Commonly known as: SEROQUEL Take 100 mg by mouth every morning.   saccharomyces boulardii 250 MG capsule Commonly known as: FLORASTOR Take 1 capsule (250 mg total) by mouth 2 (two) times daily for 14 days.          Follow-up Information     Clarene Essex, MD. Schedule an appointment as soon as possible for a visit in 2 week(s).   Specialty: Gastroenterology Contact information: [id number] [place] [address] [phone]                 TOTAL DISCHARGE TIME: 35 minutes  Deborah Kelly [place] Pink  [facility] Pager on [email or web address]  08/02/2021, 10:24 AM

## 2021-09-02 ENCOUNTER — Emergency Department (HOSPITAL_COMMUNITY): Payer: No Typology Code available for payment source

## 2021-09-02 ENCOUNTER — Other Ambulatory Visit: Payer: Self-pay

## 2021-09-02 ENCOUNTER — Encounter (HOSPITAL_COMMUNITY): Payer: Self-pay

## 2021-09-02 ENCOUNTER — Emergency Department (HOSPITAL_COMMUNITY)
Admission: EM | Admit: 2021-09-02 | Discharge: 2021-09-02 | Disposition: A | Discharge status: Home / Self Care | Payer: No Typology Code available for payment source | Attending: Emergency Medicine | Admitting: Emergency Medicine

## 2021-09-02 DIAGNOSIS — N9489 Other specified conditions associated with female genital organs and menstrual cycle: Secondary | ICD-10-CM | POA: Insufficient documentation

## 2021-09-02 DIAGNOSIS — R112 Nausea with vomiting, unspecified: Secondary | ICD-10-CM | POA: Diagnosis not present

## 2021-09-02 DIAGNOSIS — R1013 Epigastric pain: Secondary | ICD-10-CM | POA: Insufficient documentation

## 2021-09-02 DIAGNOSIS — D72829 Elevated white blood cell count, unspecified: Secondary | ICD-10-CM | POA: Diagnosis not present

## 2021-09-02 DIAGNOSIS — F12188 Cannabis abuse with other cannabis-induced disorder: Secondary | ICD-10-CM

## 2021-09-02 DIAGNOSIS — R1084 Generalized abdominal pain: Secondary | ICD-10-CM | POA: Insufficient documentation

## 2021-09-02 LAB — CBC WITH DIFFERENTIAL/PLATELET
Abs Immature Granulocytes: 0.03 10*3/uL (ref 0.00–0.07)
Basophils Absolute: 0.1 10*3/uL (ref 0.0–0.1)
Basophils Relative: 0 %
Eosinophils Absolute: 0.3 10*3/uL (ref 0.0–0.5)
Eosinophils Relative: 2 %
HCT: 42.2 % (ref 36.0–46.0)
Hemoglobin: 13.9 g/dL (ref 12.0–15.0)
Immature Granulocytes: 0 %
Lymphocytes Relative: 28 %
Lymphs Abs: 3.3 10*3/uL (ref 0.7–4.0)
MCH: 29.6 pg (ref 26.0–34.0)
MCHC: 32.9 g/dL (ref 30.0–36.0)
MCV: 90 fL (ref 80.0–100.0)
Monocytes Absolute: 0.9 10*3/uL (ref 0.1–1.0)
Monocytes Relative: 8 %
Neutro Abs: 7.3 10*3/uL (ref 1.7–7.7)
Neutrophils Relative %: 62 %
Platelets: 381 10*3/uL (ref 150–400)
RBC: 4.69 MIL/uL (ref 3.87–5.11)
RDW: 13.7 % (ref 11.5–15.5)
WBC: 11.9 10*3/uL — ABNORMAL HIGH (ref 4.0–10.5)
nRBC: 0 % (ref 0.0–0.2)

## 2021-09-02 LAB — URINALYSIS, ROUTINE W REFLEX MICROSCOPIC
Bilirubin Urine: NEGATIVE
Glucose, UA: NEGATIVE mg/dL
Hgb urine dipstick: NEGATIVE
Ketones, ur: 20 mg/dL — AB
Nitrite: NEGATIVE
Protein, ur: 30 mg/dL — AB
Specific Gravity, Urine: 1.036 — ABNORMAL HIGH (ref 1.005–1.030)
pH: 5 (ref 5.0–8.0)

## 2021-09-02 LAB — COMPREHENSIVE METABOLIC PANEL
ALT: 15 U/L (ref 0–44)
AST: 19 U/L (ref 15–41)
Albumin: 4.6 g/dL (ref 3.5–5.0)
Alkaline Phosphatase: 88 U/L (ref 38–126)
Anion gap: 10 (ref 5–15)
BUN: 10 mg/dL (ref 6–20)
CO2: 29 mmol/L (ref 22–32)
Calcium: 9.6 mg/dL (ref 8.9–10.3)
Chloride: 106 mmol/L (ref 98–111)
Creatinine, Ser: 0.91 mg/dL (ref 0.44–1.00)
GFR, Estimated: >60 mL/min (ref >60)
Glucose, Bld: 102 mg/dL — ABNORMAL HIGH (ref 70–99)
Potassium: 3.4 mmol/L — ABNORMAL LOW (ref 3.5–5.1)
Sodium: 145 mmol/L (ref 135–145)
Total Bilirubin: 0.6 mg/dL (ref 0.3–1.2)
Total Protein: 8 g/dL (ref 6.5–8.1)

## 2021-09-02 LAB — LIPASE, BLOOD: Lipase: 22 U/L (ref 11–51)

## 2021-09-02 LAB — RAPID URINE DRUG SCREEN, HOSP PERFORMED
Amphetamines: NOT DETECTED
Barbiturates: NOT DETECTED
Benzodiazepines: POSITIVE — AB
Cocaine: NOT DETECTED
Opiates: POSITIVE — AB
Tetrahydrocannabinol: POSITIVE — AB

## 2021-09-02 LAB — ETHANOL: Alcohol, Ethyl (B): <10 mg/dL (ref <10)

## 2021-09-02 LAB — PREGNANCY, URINE: Preg Test, Ur: NEGATIVE

## 2021-09-02 MED ORDER — LORAZEPAM 2 MG/ML IJ SOLN
1.0000 mg | Freq: Once | INTRAMUSCULAR | Status: AC
  Administered 2021-09-02: 1 mg via INTRAVENOUS
  Filled 2021-09-02: qty 1

## 2021-09-02 MED ORDER — METOCLOPRAMIDE HCL 10 MG PO TABS: 10.0000 mg | ORAL_TABLET | Freq: Three times a day (TID) | ORAL | 1 refills | Status: DC

## 2021-09-02 MED ORDER — HALOPERIDOL LACTATE 5 MG/ML IJ SOLN
2.0000 mg | Freq: Once | INTRAMUSCULAR | Status: AC
  Administered 2021-09-02: 2 mg via INTRAVENOUS
  Filled 2021-09-02: qty 1

## 2021-09-02 MED ORDER — DROPERIDOL 2.5 MG/ML IJ SOLN
1.2500 mg | Freq: Once | INTRAMUSCULAR | Status: AC
  Administered 2021-09-02: 1.25 mg via INTRAVENOUS
  Filled 2021-09-02: qty 2

## 2021-09-02 MED ORDER — FENTANYL CITRATE PF 50 MCG/ML IJ SOSY
50.0000 ug | PREFILLED_SYRINGE | Freq: Once | INTRAMUSCULAR | Status: AC
  Administered 2021-09-02: 50 ug via INTRAVENOUS
  Filled 2021-09-02: qty 1

## 2021-09-02 NOTE — ED Provider Notes (Signed)
[facility]-EMERGENCY DEPT Provider Note   CSN: [id number] Arrival date & time: 09/02/21  8413     History  Chief Complaint  Patient presents with   Abdominal Pain    Deborah Kelly is a 31 y.o. female hx of gastroparesis. Recently hospitalized 1 month ago. Referred to [facility] for mgmt of her cyclic vomiting but doesn't "currently have the resources." Has had vomiting and abd pain for 4 days. No hx og SBO. Denies flatus or bm for four days.   The history is provided by the patient.  Abdominal Pain Pain location:  Generalized Pain quality: aching and bloating   Pain radiates to:  Does not radiate Pain severity:  Moderate Onset quality:  Sudden Duration:  4 days Timing:  Constant Progression:  Worsening Chronicity:  Recurrent Relieved by:  Nothing Worsened by:  Nothing Ineffective treatments:  None tried Associated symptoms: nausea and vomiting   Risk factors: recent hospitalization        Home Medications Prior to Admission medications   Medication Sig Start Date End Date Taking? Authorizing Provider  acetaminophen-codeine (TYLENOL #4) 300-60 MG tablet Take 1 tablet by mouth every 8 (eight) hours as needed. 08/01/21   Osvaldo Shipper, MD  atenolol-chlorthalidone (TENORETIC) 100-25 MG tablet Take 1 tablet by mouth at bedtime.    [provider]  bisacodyl (DULCOLAX) 5 MG EC tablet Take 2 tablets (10 mg total) by mouth daily. 08/01/21   Osvaldo Shipper, MD  clonazePAM (KLONOPIN) 1 MG tablet Take 1 mg by mouth 2 (two) times daily. 04/17/21   [provider]  erythromycin (E-MYCIN) 250 MG tablet Take 1 tablet (250 mg total) by mouth 3 (three) times daily before meals. 08/01/21   Osvaldo Shipper, MD  lamoTRIgine (LAMICTAL) 100 MG tablet Take 1 tablet (100 mg total) by mouth daily. Patient not taking: Reported on 07/25/2021 07/31/21   Drema Dallas, MD  lamoTRIgine (LAMICTAL) 150 MG tablet Take 1 tablet (150 mg total) by mouth daily. Patient not  taking: Reported on 07/25/2021 08/07/21   Drema Dallas, MD  lamoTRIgine (LAMICTAL) 200 MG tablet Take 1 tablet (200 mg total) by mouth daily. Patient taking differently: Take 200 mg by mouth at bedtime. 08/14/21   Drema Dallas, MD  lamoTRIgine (LAMICTAL) 25 MG tablet Take 1 tablet (25 mg total) by mouth daily. Patient not taking: Reported on 07/25/2021 07/23/21   Drema Dallas, MD  lamoTRIgine (LAMICTAL) 25 MG tablet Take 2 tablets (50 mg total) by mouth daily. Patient not taking: Reported on 07/25/2021 07/24/21   Drema Dallas, MD  OVER THE COUNTER MEDICATION Take 30-60 mLs by mouth 2 (two) times daily as needed (constipation). Dulcolax liquid - unknown ingredients    [provider]  pantoprazole (PROTONIX) 40 MG tablet Take 1 tablet (40 mg total) by mouth daily. Patient not taking: Reported on 07/15/2021 04/29/21 04/29/22  Pokhrel, Rebekah Chester[place], MD  phenol (CHLORASEPTIC) 1.4 % LIQD Use as directed 1 spray in the mouth or throat as needed for throat irritation / pain. 07/22/21   Drema Dallas, MD  polyethylene glycol (MIRALAX / GLYCOLAX) 17 g packet Take 17 g by mouth daily. 08/01/21   Osvaldo Shipper, MD  promethazine (PHENERGAN) 12.5 MG tablet Take 2 tablets (25 mg total) by mouth every 6 (six) hours as needed for nausea or vomiting. 07/31/21   Osvaldo Shipper, MD  QUEtiapine (SEROQUEL) 100 MG tablet Take 100 mg by mouth every morning.    [provider]  QUEtiapine (  SEROQUEL) 300 MG tablet Take 300 mg by mouth at bedtime.    [provider]      Allergies    Penicillins, Amlodipine, and Dicyclomine    Review of Systems   Review of Systems  Gastrointestinal:  Positive for abdominal pain, nausea and vomiting.    Physical Exam Updated Vital Signs BP (!) 139/98   Pulse 94   Temp 98.5 F (36.9 C) (Oral)   Resp 17   Ht 5\' 8"  (1.727 m)   Wt (!) 145.2 kg   SpO2 100%   BMI 48.66 kg/m  Physical Exam Vitals and nursing note reviewed.  Constitutional:      General:  She is not in acute distress.    Appearance: She is well-developed. She is not diaphoretic.  HENT:     Head: Normocephalic and atraumatic.     Right Ear: External ear normal.     Left Ear: External ear normal.     Nose: Nose normal.     Mouth/Throat:     Mouth: Mucous membranes are moist.  Eyes:     General: No scleral icterus.    Conjunctiva/sclera: Conjunctivae normal.  Cardiovascular:     Rate and Rhythm: Normal rate and regular rhythm.     Heart sounds: Normal heart sounds. No murmur heard.    No friction rub. No gallop.  Pulmonary:     Effort: Pulmonary effort is normal. No respiratory distress.     Breath sounds: Normal breath sounds.  Abdominal:     General: Bowel sounds are normal. There is no distension.     Palpations: Abdomen is soft. There is no mass.     Tenderness: There is generalized abdominal tenderness. There is no guarding.  Musculoskeletal:     Cervical back: Normal range of motion.  Skin:    General: Skin is warm and dry.  Neurological:     Mental Status: She is alert and oriented to person, place, and time.  Psychiatric:        Behavior: Behavior normal.     ED Results / Procedures / Treatments   Labs (all labs ordered are listed, but only abnormal results are displayed) Labs Reviewed  COMPREHENSIVE METABOLIC PANEL  CBC WITH DIFFERENTIAL/PLATELET  URINALYSIS, ROUTINE W REFLEX MICROSCOPIC  ETHANOL  LIPASE, BLOOD  RAPID URINE DRUG SCREEN, HOSP PERFORMED  I-STAT BETA HCG BLOOD, ED ([facility], [facility], [facility] ONLY)    EKG None  Radiology No results found.  Procedures Procedures    Medications Ordered in ED Medications - No data to display  ED Course/ Medical Decision Making/ A&P Clinical Course as of 09/03/21 1856  Tue Sep 02, 2021  0555 Patient is c/o abdominal pain- she has had no active vomiting here in the ED [AH]    Clinical Course User Index [AH] Sep 04, 2021, PA-C                           Medical Decision Making 31 year old female  here with complaint of recurrent nausea vomiting.  She is complaining of epigastric abdominal pain.  She has a history of gastroparesis and after extensive work-up and admissions this is thought to be concurrent with and or due to marijuana abuse.  Patient has not had any active vomiting here in the emergency department.  She was given droperidol for her nausea and fentanyl however continued to demand Dilaudid and Phenergan which she feels works better for her.  I did not order  Phenergan as it is currently on national back order.  Given the fact that the patient is not having intractable vomiting I suspect that she will likely be able to be discharged.  Currently awaiting p.o. challenge.  Labs reviewed and are reassuring.  I have given signout to PA Sophia at shift handoff  Amount and/or Complexity of Data Reviewed Labs: ordered.    Details: mild leukocytosis- urine likely contaminated. patient denies urinary sx Radiology: ordered.  Risk Prescription drug management.           Final Clinical Impression(s) / ED Diagnoses Final diagnoses:  None    Rx / DC Orders ED Discharge Orders     None         Arthor Captain, PA-C 09/03/21 1901    Palumbo, April, MD 09/03/21 2351

## 2021-09-04 LAB — URINE CULTURE

## 2022-05-17 ENCOUNTER — Other Ambulatory Visit: Payer: Self-pay

## 2022-05-17 ENCOUNTER — Inpatient Hospital Stay (HOSPITAL_COMMUNITY)
Admission: EM | Admit: 2022-05-17 | Discharge: 2022-05-22 | DRG: 074 | Disposition: A | Discharge status: Home / Self Care | Payer: BC Managed Care – PPO | Attending: Family Medicine | Admitting: Family Medicine

## 2022-05-17 DIAGNOSIS — E039 Hypothyroidism, unspecified: Secondary | ICD-10-CM | POA: Diagnosis present

## 2022-05-17 DIAGNOSIS — Z79899 Other long term (current) drug therapy: Secondary | ICD-10-CM

## 2022-05-17 DIAGNOSIS — E0500 Thyrotoxicosis with diffuse goiter without thyrotoxic crisis or storm: Secondary | ICD-10-CM | POA: Diagnosis present

## 2022-05-17 DIAGNOSIS — E876 Hypokalemia: Secondary | ICD-10-CM | POA: Diagnosis present

## 2022-05-17 DIAGNOSIS — Z9151 Personal history of suicidal behavior: Secondary | ICD-10-CM | POA: Diagnosis not present

## 2022-05-17 DIAGNOSIS — F319 Bipolar disorder, unspecified: Secondary | ICD-10-CM | POA: Diagnosis present

## 2022-05-17 DIAGNOSIS — F4310 Post-traumatic stress disorder, unspecified: Secondary | ICD-10-CM | POA: Diagnosis present

## 2022-05-17 DIAGNOSIS — Z7984 Long term (current) use of oral hypoglycemic drugs: Secondary | ICD-10-CM | POA: Diagnosis not present

## 2022-05-17 DIAGNOSIS — E860 Dehydration: Secondary | ICD-10-CM | POA: Diagnosis present

## 2022-05-17 DIAGNOSIS — I10 Essential (primary) hypertension: Secondary | ICD-10-CM | POA: Diagnosis present

## 2022-05-17 DIAGNOSIS — F3111 Bipolar disorder, current episode manic without psychotic features, mild: Secondary | ICD-10-CM | POA: Diagnosis not present

## 2022-05-17 DIAGNOSIS — D75839 Thrombocytosis, unspecified: Secondary | ICD-10-CM | POA: Diagnosis present

## 2022-05-17 DIAGNOSIS — K3184 Gastroparesis: Secondary | ICD-10-CM | POA: Diagnosis not present

## 2022-05-17 DIAGNOSIS — D72829 Elevated white blood cell count, unspecified: Secondary | ICD-10-CM | POA: Diagnosis present

## 2022-05-17 DIAGNOSIS — Z6841 Body Mass Index (BMI) 40.0 and over, adult: Secondary | ICD-10-CM

## 2022-05-17 DIAGNOSIS — K5900 Constipation, unspecified: Secondary | ICD-10-CM | POA: Diagnosis present

## 2022-05-17 DIAGNOSIS — Z888 Allergy status to other drugs, medicaments and biological substances status: Secondary | ICD-10-CM

## 2022-05-17 DIAGNOSIS — Z880 Allergy status to penicillin: Secondary | ICD-10-CM

## 2022-05-17 DIAGNOSIS — G894 Chronic pain syndrome: Secondary | ICD-10-CM | POA: Diagnosis present

## 2022-05-17 DIAGNOSIS — E1143 Type 2 diabetes mellitus with diabetic autonomic (poly)neuropathy: Secondary | ICD-10-CM | POA: Diagnosis not present

## 2022-05-17 DIAGNOSIS — F17290 Nicotine dependence, other tobacco product, uncomplicated: Secondary | ICD-10-CM | POA: Diagnosis present

## 2022-05-17 DIAGNOSIS — F1210 Cannabis abuse, uncomplicated: Secondary | ICD-10-CM | POA: Diagnosis present

## 2022-05-17 DIAGNOSIS — R112 Nausea with vomiting, unspecified: Secondary | ICD-10-CM | POA: Diagnosis not present

## 2022-05-17 DIAGNOSIS — E66813 Obesity, class 3: Secondary | ICD-10-CM | POA: Diagnosis present

## 2022-05-17 LAB — COMPREHENSIVE METABOLIC PANEL
ALT: 21 U/L (ref 0–44)
AST: 24 U/L (ref 15–41)
Albumin: 4.3 g/dL (ref 3.5–5.0)
Alkaline Phosphatase: 89 U/L (ref 38–126)
Anion gap: 13 (ref 5–15)
BUN: 10 mg/dL (ref 6–20)
CO2: 27 mmol/L (ref 22–32)
Calcium: 8.6 mg/dL — ABNORMAL LOW (ref 8.9–10.3)
Chloride: 100 mmol/L (ref 98–111)
Creatinine, Ser: 0.8 mg/dL (ref 0.44–1.00)
GFR, Estimated: >60 mL/min (ref >60)
Glucose, Bld: 126 mg/dL — ABNORMAL HIGH (ref 70–99)
Potassium: 2.6 mmol/L — CL (ref 3.5–5.1)
Sodium: 140 mmol/L (ref 135–145)
Total Bilirubin: 0.2 mg/dL — ABNORMAL LOW (ref 0.3–1.2)
Total Protein: 6.9 g/dL (ref 6.5–8.1)

## 2022-05-17 LAB — CBC
HCT: 37 % (ref 36.0–46.0)
Hemoglobin: 12.1 g/dL (ref 12.0–15.0)
MCH: 28 pg (ref 26.0–34.0)
MCHC: 32.7 g/dL (ref 30.0–36.0)
MCV: 85.6 fL (ref 80.0–100.0)
Platelets: 420 10*3/uL — ABNORMAL HIGH (ref 150–400)
RBC: 4.32 MIL/uL (ref 3.87–5.11)
RDW: 14.4 % (ref 11.5–15.5)
WBC: 15.2 10*3/uL — ABNORMAL HIGH (ref 4.0–10.5)
nRBC: 0 % (ref 0.0–0.2)

## 2022-05-17 LAB — CBC WITH DIFFERENTIAL/PLATELET
Abs Immature Granulocytes: 0.07 10*3/uL (ref 0.00–0.07)
Basophils Absolute: 0.1 10*3/uL (ref 0.0–0.1)
Basophils Relative: 0 %
Eosinophils Absolute: 0 10*3/uL (ref 0.0–0.5)
Eosinophils Relative: 0 %
HCT: 38.9 % (ref 36.0–46.0)
Hemoglobin: 12.7 g/dL (ref 12.0–15.0)
Immature Granulocytes: 0 %
Lymphocytes Relative: 15 %
Lymphs Abs: 2.4 10*3/uL (ref 0.7–4.0)
MCH: 27.8 pg (ref 26.0–34.0)
MCHC: 32.6 g/dL (ref 30.0–36.0)
MCV: 85.1 fL (ref 80.0–100.0)
Monocytes Absolute: 1.3 10*3/uL — ABNORMAL HIGH (ref 0.1–1.0)
Monocytes Relative: 8 %
Neutro Abs: 12.7 10*3/uL — ABNORMAL HIGH (ref 1.7–7.7)
Neutrophils Relative %: 77 %
Platelets: 452 10*3/uL — ABNORMAL HIGH (ref 150–400)
RBC: 4.57 MIL/uL (ref 3.87–5.11)
RDW: 14.3 % (ref 11.5–15.5)
WBC: 16.5 10*3/uL — ABNORMAL HIGH (ref 4.0–10.5)
nRBC: 0 % (ref 0.0–0.2)

## 2022-05-17 LAB — URINALYSIS, ROUTINE W REFLEX MICROSCOPIC
Bacteria, UA: NONE SEEN
Bilirubin Urine: NEGATIVE
Glucose, UA: NEGATIVE mg/dL
Ketones, ur: 20 mg/dL — AB
Leukocytes,Ua: NEGATIVE
Nitrite: NEGATIVE
Protein, ur: 100 mg/dL — AB
Specific Gravity, Urine: 1.028 (ref 1.005–1.030)
pH: 6 (ref 5.0–8.0)

## 2022-05-17 LAB — CREATININE, SERUM
Creatinine, Ser: 0.73 mg/dL (ref 0.44–1.00)
GFR, Estimated: >60 mL/min (ref >60)

## 2022-05-17 LAB — RAPID URINE DRUG SCREEN, HOSP PERFORMED
Amphetamines: NOT DETECTED
Barbiturates: NOT DETECTED
Benzodiazepines: POSITIVE — AB
Cocaine: NOT DETECTED
Opiates: POSITIVE — AB
Tetrahydrocannabinol: POSITIVE — AB

## 2022-05-17 LAB — LIPASE, BLOOD: Lipase: 31 U/L (ref 11–51)

## 2022-05-17 LAB — MAGNESIUM: Magnesium: 1.9 mg/dL (ref 1.7–2.4)

## 2022-05-17 LAB — PREGNANCY, URINE: Preg Test, Ur: NEGATIVE

## 2022-05-17 MED ORDER — KETOROLAC TROMETHAMINE 15 MG/ML IJ SOLN
15.0000 mg | Freq: Four times a day (QID) | INTRAMUSCULAR | Status: DC | PRN
  Administered 2022-05-17 – 2022-05-22 (×4): 15 mg via INTRAVENOUS
  Filled 2022-05-17 (×4): qty 1

## 2022-05-17 MED ORDER — ENOXAPARIN SODIUM 80 MG/0.8ML IJ SOSY
80.0000 mg | PREFILLED_SYRINGE | INTRAMUSCULAR | Status: DC
  Administered 2022-05-17 – 2022-05-20 (×4): 80 mg via SUBCUTANEOUS
  Filled 2022-05-17 (×5): qty 0.8

## 2022-05-17 MED ORDER — HYDROMORPHONE HCL 1 MG/ML IJ SOLN
1.0000 mg | Freq: Once | INTRAMUSCULAR | Status: AC
  Administered 2022-05-17: 1 mg via INTRAVENOUS
  Filled 2022-05-17: qty 1

## 2022-05-17 MED ORDER — PROMETHAZINE HCL 25 MG PO TABS
12.5000 mg | ORAL_TABLET | Freq: Four times a day (QID) | ORAL | Status: DC | PRN
  Filled 2022-05-17: qty 1

## 2022-05-17 MED ORDER — SODIUM CHLORIDE 0.9 % IV SOLN
12.5000 mg | Freq: Once | INTRAVENOUS | Status: AC | PRN
  Administered 2022-05-18: 12.5 mg via INTRAVENOUS
  Filled 2022-05-17: qty 12.5

## 2022-05-17 MED ORDER — POTASSIUM CHLORIDE 10 MEQ/100ML IV SOLN
10.0000 meq | INTRAVENOUS | Status: AC
  Administered 2022-05-17 (×3): 10 meq via INTRAVENOUS
  Filled 2022-05-17 (×3): qty 100

## 2022-05-17 MED ORDER — ONDANSETRON HCL 4 MG/2ML IJ SOLN
4.0000 mg | Freq: Once | INTRAMUSCULAR | Status: AC
  Administered 2022-05-17: 4 mg via INTRAVENOUS
  Filled 2022-05-17: qty 2

## 2022-05-17 MED ORDER — LORAZEPAM 2 MG/ML IJ SOLN
1.0000 mg | Freq: Once | INTRAMUSCULAR | Status: AC
  Administered 2022-05-17: 1 mg via INTRAVENOUS
  Filled 2022-05-17: qty 1

## 2022-05-17 MED ORDER — HALOPERIDOL LACTATE 5 MG/ML IJ SOLN
2.5000 mg | Freq: Once | INTRAMUSCULAR | Status: AC
  Administered 2022-05-17: 2.5 mg via INTRAVENOUS
  Filled 2022-05-17: qty 1

## 2022-05-17 MED ORDER — MORPHINE SULFATE (PF) 4 MG/ML IV SOLN
4.0000 mg | Freq: Once | INTRAVENOUS | Status: AC
  Administered 2022-05-17: 4 mg via INTRAVENOUS
  Filled 2022-05-17: qty 1

## 2022-05-17 MED ORDER — LORAZEPAM 2 MG/ML IJ SOLN
1.0000 mg | INTRAMUSCULAR | Status: DC | PRN
  Administered 2022-05-17 – 2022-05-22 (×20): 1 mg via INTRAVENOUS
  Filled 2022-05-17 (×21): qty 1

## 2022-05-17 MED ORDER — LACTATED RINGERS IV BOLUS
1000.0000 mL | Freq: Once | INTRAVENOUS | Status: AC
  Administered 2022-05-17: 1000 mL via INTRAVENOUS

## 2022-05-17 MED ORDER — PANTOPRAZOLE SODIUM 40 MG IV SOLR
40.0000 mg | Freq: Once | INTRAVENOUS | Status: AC
  Administered 2022-05-17: 40 mg via INTRAVENOUS
  Filled 2022-05-17: qty 10

## 2022-05-17 MED ORDER — METOCLOPRAMIDE HCL 5 MG/ML IJ SOLN
10.0000 mg | Freq: Four times a day (QID) | INTRAMUSCULAR | Status: DC
  Administered 2022-05-17 – 2022-05-22 (×16): 10 mg via INTRAVENOUS
  Filled 2022-05-17 (×16): qty 2

## 2022-05-17 MED ORDER — KCL-LACTATED RINGERS-D5W 20 MEQ/L IV SOLN
INTRAVENOUS | Status: DC
  Filled 2022-05-17 (×7): qty 1000

## 2022-05-17 NOTE — ED Triage Notes (Signed)
Pt reports vomiting, stomach cramping and "heart fluttering" x few days

## 2022-05-17 NOTE — ED Provider Notes (Signed)
EMERGENCY DEPARTMENT AT [facility] Provider Note   CSN: [id number] Arrival date & time: 05/17/22  1255     History {Add pertinent medical, surgical, social history, OB history to HPI:1} Chief Complaint  Patient presents with   Emesis    Deborah Kelly is a 32 y.o. female.  She has a history of gastroparesis.  Complaining of intractable nausea vomiting lower abdominal pain and no urination that has been ongoing for the last 4 days.  She denies any fevers.  She has been trying her regular medications but she has not been able to hold them down.  She follows with a GI specialist at [facility].  This has been a longstanding problem over years.  She denies any chance of pregnancy.  No vaginal bleeding or discharge.   Emesis Severity:  Severe Duration:  4 days Timing:  Intermittent Progression:  Unchanged Chronicity:  Recurrent Recent urination:  Absent Relieved by:  Nothing Worsened by:  Antiemetics and liquids Ineffective treatments:  Antiemetics, liquids and ice chips Associated symptoms: abdominal pain   Associated symptoms: no cough, no diarrhea and no fever   Risk factors: not pregnant and no sick contacts        Home Medications Prior to Admission medications   Medication Sig Start Date End Date Taking? Authorizing Provider  acetaminophen-codeine (TYLENOL #4) 300-60 MG tablet Take 1 tablet by mouth every 8 (eight) hours as needed. 08/01/21   Bonnielee Haff, MD  atenolol-chlorthalidone (TENORETIC) 100-25 MG tablet Take 1 tablet by mouth at bedtime.    [provider]  bisacodyl (DULCOLAX) 5 MG EC tablet Take 2 tablets (10 mg total) by mouth daily. 08/01/21   Bonnielee Haff, MD  clonazePAM (KLONOPIN) 1 MG tablet Take 1 mg by mouth 2 (two) times daily. 04/17/21   [provider]  erythromycin (E-MYCIN) 250 MG tablet Take 1 tablet (250 mg total) by mouth 3 (three) times daily before meals. Patient not taking: Reported on 09/02/2021 08/01/21    Bonnielee Haff, MD  lamoTRIgine (LAMICTAL) 200 MG tablet Take 1 tablet (200 mg total) by mouth daily. Patient taking differently: Take 200 mg by mouth at bedtime. 08/14/21   Allie Bossier, MD  metoCLOPramide (REGLAN) 10 MG tablet Take 1 tablet (10 mg total) by mouth 3 (three) times daily with meals. 09/02/21 09/02/22  Fransico Meadow, PA-C  OVER THE COUNTER MEDICATION Take 30-60 mLs by mouth 2 (two) times daily as needed (constipation). Dulcolax liquid - unknown ingredients    [provider]  pantoprazole (PROTONIX) 40 MG tablet Take 1 tablet (40 mg total) by mouth daily. Patient not taking: Reported on 07/15/2021 04/29/21 04/29/22  Pokhrel, Corrie Mckusick, MD  phenol (CHLORASEPTIC) 1.4 % LIQD Use as directed 1 spray in the mouth or throat as needed for throat irritation / pain. 07/22/21   Allie Bossier, MD  polyethylene glycol (MIRALAX / GLYCOLAX) 17 g packet Take 17 g by mouth daily. Patient not taking: Reported on 09/02/2021 08/01/21   Bonnielee Haff, MD  promethazine (PHENERGAN) 12.5 MG tablet Take 2 tablets (25 mg total) by mouth every 6 (six) hours as needed for nausea or vomiting. 07/31/21   Bonnielee Haff, MD  QUEtiapine (SEROQUEL) 100 MG tablet Take 100 mg by mouth every morning.    [provider]  QUEtiapine (SEROQUEL) 300 MG tablet Take 300 mg by mouth at bedtime.    [provider]      Allergies    Penicillins, Amlodipine, and Dicyclomine  Review of Systems   Review of Systems  Constitutional:  Negative for fever.  Respiratory:  Negative for cough.   Gastrointestinal:  Positive for abdominal pain and vomiting. Negative for diarrhea.    Physical Exam Updated Vital Signs BP 131/67 (BP Location: Right Arm)   Pulse (!) 106   Temp 98.5 F (36.9 C) (Oral)   Resp 16   Ht '5\' 8"'$  (1.727 m)   SpO2 100%   BMI 48.66 kg/m  Physical Exam Vitals and nursing note reviewed.  Constitutional:      General: She is not in acute distress.    Appearance: Normal  appearance. She is well-developed. She is obese.  HENT:     Head: Normocephalic and atraumatic.  Eyes:     Conjunctiva/sclera: Conjunctivae normal.  Cardiovascular:     Rate and Rhythm: Regular rhythm. Tachycardia present.     Heart sounds: No murmur heard. Pulmonary:     Effort: Pulmonary effort is normal. No respiratory distress.     Breath sounds: Normal breath sounds.  Abdominal:     Palpations: Abdomen is soft.     Tenderness: There is abdominal tenderness (diffuse). There is no guarding or rebound.  Musculoskeletal:        General: No deformity.     Cervical back: Neck supple.  Skin:    General: Skin is warm and dry.     Capillary Refill: Capillary refill takes less than 2 seconds.  Neurological:     General: No focal deficit present.     Mental Status: She is alert.     Motor: No weakness.     ED Results / Procedures / Treatments   Labs (all labs ordered are listed, but only abnormal results are displayed) Labs Reviewed  COMPREHENSIVE METABOLIC PANEL  LIPASE, BLOOD  CBC WITH DIFFERENTIAL/PLATELET  MAGNESIUM  URINALYSIS, ROUTINE W REFLEX MICROSCOPIC  RAPID URINE DRUG SCREEN, HOSP PERFORMED    EKG None  Radiology No results found.  Procedures Procedures  {Document cardiac monitor, telemetry assessment procedure when appropriate:1}  Medications Ordered in ED Medications  lactated ringers bolus 1,000 mL (has no administration in time range)  LORazepam (ATIVAN) injection 1 mg (has no administration in time range)  pantoprazole (PROTONIX) injection 40 mg (has no administration in time range)  HYDROmorphone (DILAUDID) injection 1 mg (has no administration in time range)  haloperidol lactate (HALDOL) injection 2.5 mg (has no administration in time range)    ED Course/ Medical Decision Making/ A&P   {   Click here for ABCD2, HEART and other calculatorsREFRESH Note before signing :1}                          Medical Decision Making Amount and/or Complexity  of Data Reviewed Labs: ordered.  Risk Prescription drug management.   This patient complains of ***; this involves an extensive number of treatment Options and is a complaint that carries with it a high risk of complications and morbidity. The differential includes ***  I ordered, reviewed and interpreted labs, which included *** I ordered medication *** and reviewed PMP when indicated. I ordered imaging studies which included *** and I independently    visualized and interpreted imaging which showed *** Additional history obtained from *** Previous records obtained and reviewed *** I consulted *** and discussed lab and imaging findings and discussed disposition.  Cardiac monitoring reviewed, *** Social determinants considered, *** Critical Interventions: ***  After the interventions stated above, I reevaluated  the patient and found *** Admission and further testing considered, ***   {Document critical care time when appropriate:1} {Document review of labs and clinical decision tools ie heart score, Chads2Vasc2 etc:1}  {Document your independent review of radiology images, and any outside records:1} {Document your discussion with family members, caretakers, and with consultants:1} {Document social determinants of health affecting pt's care:1} {Document your decision making why or why not admission, treatments were needed:1} Final Clinical Impression(s) / ED Diagnoses Final diagnoses:  None    Rx / DC Orders ED Discharge Orders     None

## 2022-05-17 NOTE — H&P (Signed)
History and Physical    Patient: Deborah Kelly [id number] DOB: 08-29-1990 DOA: 05/17/2022 DOS: the patient was seen and examined on 05/17/2022 PCP: Pcp, No  Patient coming from: Home  Chief Complaint:  Chief Complaint  Patient presents with   Emesis   HPI: Deborah Kelly is a 32 y.o. female with medical history significant of gastroparesis, bipolar disorder, cannabis abuse, depression, history of hypothyroidism with Graves' disease who has had intensive workup and treatment for her gastroparesis and has been to multiple hospitals including [facility] where she currently goes to for her care.  Patient came in complaining of intractable nausea with vomiting.  She has been unable to take her home regimen including home medications.  She is also dehydrated.  Patient found to have a potassium of 2.6 in the ER.  She also complained of some abdominal pain.  In the ER she has received Haldol and Phenergan.  Mild relief but still too weak.  EKG shows no significant change.  This point recommendation is for admission due to patient's low potassium and inability to keep anything down.  Review of Systems: As mentioned in the history of present illness. All other systems reviewed and are negative. Past Medical History:  Diagnosis Date   Alcohol abuse 05/19/2017   Anxiety    Bipolar 1 disorder ([place])    Bipolar affective disorder, depressed ([place]) 01/20/2012   Cannabis misuse 03/15/2021   Chronic pelvic pain in female 07/08/2020   Depression    Dysmenorrhea 07/08/2020   Elevated prolactin level 03/15/2021   Gastroparesis    Graves disease    History of suicide attempt 05/18/2017   Hyperthyroidism    Intractable vomiting 04/27/2021   No past surgical history on file. Social History:  reports that she has quit smoking. Her smoking use included cigarettes. She has never used smokeless tobacco. She reports that she does not currently use alcohol. She reports current drug use. Drug: Marijuana.  Allergies   Allergen Reactions   Penicillins Swelling and Rash    Throat swells   Amlodipine Hives   Dicyclomine Rash and Other (See Comments)    Pt states it makes her hands shake;     No family history on file.  Prior to Admission medications   Medication Sig Start Date End Date Taking? Authorizing Provider  acetaminophen-codeine (TYLENOL #4) 300-60 MG tablet Take 1 tablet by mouth every 8 (eight) hours as needed. 08/01/21   Bonnielee Haff, MD  atenolol-chlorthalidone (TENORETIC) 100-25 MG tablet Take 1 tablet by mouth at bedtime.    [provider]  bisacodyl (DULCOLAX) 5 MG EC tablet Take 2 tablets (10 mg total) by mouth daily. 08/01/21   Bonnielee Haff, MD  clonazePAM (KLONOPIN) 1 MG tablet Take 1 mg by mouth 2 (two) times daily. 04/17/21   [provider]  erythromycin (E-MYCIN) 250 MG tablet Take 1 tablet (250 mg total) by mouth 3 (three) times daily before meals. Patient not taking: Reported on 09/02/2021 08/01/21   Bonnielee Haff, MD  lamoTRIgine (LAMICTAL) 200 MG tablet Take 1 tablet (200 mg total) by mouth daily. Patient taking differently: Take 200 mg by mouth at bedtime. 08/14/21   Allie Bossier, MD  metoCLOPramide (REGLAN) 10 MG tablet Take 1 tablet (10 mg total) by mouth 3 (three) times daily with meals. 09/02/21 09/02/22  Fransico Meadow, PA-C  OVER THE COUNTER MEDICATION Take 30-60 mLs by mouth 2 (two) times daily as needed (constipation). Dulcolax liquid - unknown ingredients    [provider]  pantoprazole (PROTONIX) 40 MG tablet Take 1 tablet (40 mg total) by mouth daily. Patient not taking: Reported on 07/15/2021 04/29/21 04/29/22  Pokhrel, Corrie Mckusick, MD  phenol (CHLORASEPTIC) 1.4 % LIQD Use as directed 1 spray in the mouth or throat as needed for throat irritation / pain. 07/22/21   Allie Bossier, MD  polyethylene glycol (MIRALAX / GLYCOLAX) 17 g packet Take 17 g by mouth daily. Patient not taking: Reported on 09/02/2021 08/01/21   Bonnielee Haff, MD   promethazine (PHENERGAN) 12.5 MG tablet Take 2 tablets (25 mg total) by mouth every 6 (six) hours as needed for nausea or vomiting. 07/31/21   Bonnielee Haff, MD  QUEtiapine (SEROQUEL) 100 MG tablet Take 100 mg by mouth every morning.    [provider]  QUEtiapine (SEROQUEL) 300 MG tablet Take 300 mg by mouth at bedtime.    [provider]    Physical Exam: Vitals:   05/17/22 1306 05/17/22 1500 05/17/22 1741 05/17/22 1800  BP:  (!) 133/54  116/62  Pulse:  84  93  Resp:  18  17  Temp:   98.3 F (36.8 C)   TempSrc:   Oral   SpO2:  100%  100%  Height: '5\' 8"'$  (1.727 m)      Constitutional: Acutely ill looking, morbidly obese NAD, calm, comfortable Eyes: PERRL, lids and conjunctivae normal ENMT: Mucous membranes are moist. Posterior pharynx clear of any exudate or lesions.Normal dentition.  Neck: normal, supple, no masses, no thyromegaly Respiratory: clear to auscultation bilaterally, no wheezing, no crackles. Normal respiratory effort. No accessory muscle use.  Cardiovascular: Regular rate and rhythm, no murmurs / rubs / gallops. No extremity edema. 2+ pedal pulses. No carotid bruits.  Abdomen: no tenderness, no masses palpated. No hepatosplenomegaly. Bowel sounds positive.  Musculoskeletal: Good range of motion, no joint swelling or tenderness, Skin: no rashes, lesions, ulcers. No induration Neurologic: CN 2-12 grossly intact. Sensation intact, DTR normal. Strength 5/5 in all 4.  Psychiatric: Normal judgment and insight. Alert and oriented x 3. Normal mood  Data Reviewed:  Potassium is 2.6.  Sodium 140, glucose 126.  Creatinine 0.80.  White count 16.5.  EKG showed mild PR prolongation.  Patient was positive for benzodiazepines.  Assessment and Plan:  #1 intractable nausea with vomiting: Secondary to gastroparesis.  Patient has chronic gastroparesis.  Will admit her.  Aggressive hydration.  Will initiate Reglan IV with promethazine.  Patient not a diabetic  apparently.  The cause of her gastroparesis is not clear.  Put on clear liquid diet if tolerated.  Once patient's symptoms resolved and diet is advanced we can discharge her home on oral therapy.  #2 idiopathic gastroparesis: As per above.  #3 severe hypokalemia: Potassium 2.6.  We will replete with IV.  When able to take p.o.'s we will transition to p.o.  #4 history of Graves' disease: Will resume home regimen as soon as practicable.  #5 bipolar disorder: Will resume home regimen as soon as practicable.  She is on Seroquel.  #6 essential hypertension: Blood pressure at this point is stable.  Will use IV medicine if needed for blood pressure control.  #7 morbid obesity: Dietary counseling.      Advance Care Planning:   Code Status: Full Code   Consults: None  Family Communication: No family at bedside  Severity of Illness: The appropriate patient status for this patient is INPATIENT. Inpatient status is judged to be reasonable and necessary in order to provide the required intensity of  service to ensure the patient's safety. The patient's presenting symptoms, physical exam findings, and initial radiographic and laboratory data in the context of their chronic comorbidities is felt to place them at high risk for further clinical deterioration. Furthermore, it is not anticipated that the patient will be medically stable for discharge from the hospital within 2 midnights of admission.   * I certify that at the point of admission it is my clinical judgment that the patient will require inpatient hospital care spanning beyond 2 midnights from the point of admission due to high intensity of service, high risk for further deterioration and high frequency of surveillance required.*  AuthorBarbette Merino, MD 05/17/2022 7:17 PM  For on call review [email or web address].

## 2022-05-18 ENCOUNTER — Encounter (HOSPITAL_COMMUNITY): Payer: Self-pay | Admitting: Internal Medicine

## 2022-05-18 ENCOUNTER — Inpatient Hospital Stay (HOSPITAL_COMMUNITY): Payer: BC Managed Care – PPO

## 2022-05-18 DIAGNOSIS — I10 Essential (primary) hypertension: Secondary | ICD-10-CM

## 2022-05-18 DIAGNOSIS — F3111 Bipolar disorder, current episode manic without psychotic features, mild: Secondary | ICD-10-CM

## 2022-05-18 DIAGNOSIS — E0500 Thyrotoxicosis with diffuse goiter without thyrotoxic crisis or storm: Secondary | ICD-10-CM

## 2022-05-18 DIAGNOSIS — Z9151 Personal history of suicidal behavior: Secondary | ICD-10-CM

## 2022-05-18 DIAGNOSIS — K3184 Gastroparesis: Secondary | ICD-10-CM | POA: Diagnosis not present

## 2022-05-18 DIAGNOSIS — R112 Nausea with vomiting, unspecified: Secondary | ICD-10-CM | POA: Diagnosis not present

## 2022-05-18 LAB — COMPREHENSIVE METABOLIC PANEL
ALT: 20 U/L (ref 0–44)
AST: 19 U/L (ref 15–41)
Albumin: 4.1 g/dL (ref 3.5–5.0)
Alkaline Phosphatase: 84 U/L (ref 38–126)
Anion gap: 11 (ref 5–15)
BUN: 8 mg/dL (ref 6–20)
CO2: 25 mmol/L (ref 22–32)
Calcium: 8.2 mg/dL — ABNORMAL LOW (ref 8.9–10.3)
Chloride: 102 mmol/L (ref 98–111)
Creatinine, Ser: 0.75 mg/dL (ref 0.44–1.00)
GFR, Estimated: >60 mL/min (ref >60)
Glucose, Bld: 158 mg/dL — ABNORMAL HIGH (ref 70–99)
Potassium: 2.9 mmol/L — ABNORMAL LOW (ref 3.5–5.1)
Sodium: 138 mmol/L (ref 135–145)
Total Bilirubin: 0.4 mg/dL (ref 0.3–1.2)
Total Protein: 7 g/dL (ref 6.5–8.1)

## 2022-05-18 LAB — CBC
HCT: 40 % (ref 36.0–46.0)
Hemoglobin: 12.8 g/dL (ref 12.0–15.0)
MCH: 27.9 pg (ref 26.0–34.0)
MCHC: 32 g/dL (ref 30.0–36.0)
MCV: 87.3 fL (ref 80.0–100.0)
Platelets: 444 10*3/uL — ABNORMAL HIGH (ref 150–400)
RBC: 4.58 MIL/uL (ref 3.87–5.11)
RDW: 14.6 % (ref 11.5–15.5)
WBC: 12.2 10*3/uL — ABNORMAL HIGH (ref 4.0–10.5)
nRBC: 0 % (ref 0.0–0.2)

## 2022-05-18 LAB — MAGNESIUM: Magnesium: 2.2 mg/dL (ref 1.7–2.4)

## 2022-05-18 LAB — HIV ANTIBODY (ROUTINE TESTING W REFLEX): HIV Screen 4th Generation wRfx: NONREACTIVE

## 2022-05-18 LAB — PHOSPHORUS: Phosphorus: 1.8 mg/dL — ABNORMAL LOW (ref 2.5–4.6)

## 2022-05-18 MED ORDER — POTASSIUM CHLORIDE CRYS ER 20 MEQ PO TBCR: 40.0000 meq | EXTENDED_RELEASE_TABLET | Freq: Two times a day (BID) | ORAL | Status: DC

## 2022-05-18 MED ORDER — POTASSIUM PHOSPHATES 15 MMOLE/5ML IV SOLN
20.0000 mmol | Freq: Once | INTRAVENOUS | Status: AC
  Administered 2022-05-18: 20 mmol via INTRAVENOUS
  Filled 2022-05-18: qty 6.67

## 2022-05-18 MED ORDER — HALOPERIDOL 5 MG PO TABS
10.0000 mg | ORAL_TABLET | Freq: Two times a day (BID) | ORAL | Status: DC
  Administered 2022-05-18 – 2022-05-22 (×9): 10 mg via ORAL
  Filled 2022-05-18 (×9): qty 2

## 2022-05-18 MED ORDER — LAMOTRIGINE 100 MG PO TABS
200.0000 mg | ORAL_TABLET | Freq: Every day | ORAL | Status: DC
  Administered 2022-05-18 – 2022-05-21 (×4): 200 mg via ORAL
  Filled 2022-05-18 (×4): qty 2

## 2022-05-18 MED ORDER — PROCHLORPERAZINE EDISYLATE 10 MG/2ML IJ SOLN
10.0000 mg | Freq: Four times a day (QID) | INTRAMUSCULAR | Status: DC | PRN
  Administered 2022-05-18 – 2022-05-19 (×2): 10 mg via INTRAVENOUS
  Filled 2022-05-18 (×2): qty 2

## 2022-05-18 MED ORDER — FLUOXETINE HCL 20 MG PO CAPS: 20.0000 mg | ORAL_CAPSULE | Freq: Every day | ORAL | Status: DC

## 2022-05-18 MED ORDER — POTASSIUM CHLORIDE 10 MEQ/100ML IV SOLN
10.0000 meq | INTRAVENOUS | Status: AC
  Administered 2022-05-18 (×2): 10 meq via INTRAVENOUS
  Filled 2022-05-18 (×2): qty 100

## 2022-05-18 MED ORDER — CLONAZEPAM 1 MG PO TABS
1.0000 mg | ORAL_TABLET | Freq: Two times a day (BID) | ORAL | Status: DC
  Administered 2022-05-18 – 2022-05-22 (×9): 1 mg via ORAL
  Filled 2022-05-18 (×9): qty 1

## 2022-05-18 MED ORDER — ONDANSETRON HCL 4 MG/2ML IJ SOLN
4.0000 mg | Freq: Four times a day (QID) | INTRAMUSCULAR | Status: DC | PRN
  Administered 2022-05-18 – 2022-05-21 (×6): 4 mg via INTRAVENOUS
  Filled 2022-05-18 (×6): qty 2

## 2022-05-18 MED ORDER — POTASSIUM CHLORIDE 10 MEQ/100ML IV SOLN
10.0000 meq | INTRAVENOUS | Status: AC
  Administered 2022-05-18 (×3): 10 meq via INTRAVENOUS
  Filled 2022-05-18 (×3): qty 100

## 2022-05-18 MED ORDER — HYDROMORPHONE HCL 1 MG/ML IJ SOLN
1.0000 mg | INTRAMUSCULAR | Status: DC | PRN
  Administered 2022-05-18 – 2022-05-22 (×19): 1 mg via INTRAVENOUS
  Filled 2022-05-18 (×21): qty 1

## 2022-05-18 MED ORDER — PANTOPRAZOLE SODIUM 40 MG IV SOLR
40.0000 mg | Freq: Every day | INTRAVENOUS | Status: DC
  Administered 2022-05-18 – 2022-05-19 (×2): 40 mg via INTRAVENOUS
  Filled 2022-05-18 (×2): qty 10

## 2022-05-18 NOTE — Progress Notes (Addendum)
PROGRESS NOTE    Deborah Kelly  [id number] DOB: 10/01/1990 DOA: 05/17/2022 PCP: Pcp, No   Brief Narrative:  The patient is a 32 year old morbidly obese African-American female with past medical history significant for but not limited to gastroparesis, bipolar 1 disorder, cannabis abuse depression, history of hypothyroidism and Graves' disease as well as other comorbidities who had intensive workup and treatment for gastroparesis and multiple hospitalizations including [place] where she currently goes for care.  Given complaint of intractable nausea vomiting and unable to take her home regimen clear home medications and felt dehydrated.  She is found have a potassium of 2.6 in the ED complain some abdominal discomfort and pain.  In the ED she received Haldol and Phenergan and she improved slowly but remained weak.  Her potassium remained on the lower side and given that she is not able to really keep anything down she is admitted for intractable nausea vomiting likely in the setting of idiopathic gastroparesis flare with possible hyperemesis cannabinoid syndrome.  Currently she is getting IV fluid resuscitation and has supportive care measures including antiemetics.  Diet is a clear liquid diet for now.  Assessment and Plan: No notes have been filed under this hospital service. Service: Hospitalist  Intractable nausea vomiting in the setting of idiopathic gastroparesis flare with possible concomitant hyperemesis cannabinoid syndrome -Patient has chronic gastroparesis.   -Continue supportive care and continue with IV fluid hydration with D5 and LR at 125 MLS per hour for now -Continue with antiemetics with IV ondansetron and IV prochlorperazine -Also continue with IV metoclopramide 10 mg every 6 scheduled -Unclear cause of her gastroparesis and she has been seeing several specialist and has seen GI in outpatient setting with [facility] and they have referred her to [facility] and she had a repeat  gastric emptying study done which showed that she had to 61% of the food that she was retaining -Continue with clear liquid diet -She denies being diabetic we will obtain blood sugars and check her hemoglobin A1c -We we will also check a TSH -Continue pain control with IV ketorolac 50 mg every 6 as needed for severe pain for 5 days; judicious use of narcotics given her gastroparesis as narcotics can worsen the flare and have initiated on IV hydromorphone 1 g every 4 hours as needed -If she is not improving further will need GI consultation and evaluation -Continue with IV pantoprazole 40 mg daily -Hold gabapentin for now and resume if necessary -May need Erythromycin -Keep blood sugars under 200 -Intractable nausea vomiting can also be in the setting  Idiopathic Gastroparesis  -As above    History of Hypothyroidism with Graves' disease -Does not appear to be taking any medications for this   Bipolar 1 Disorder Depression History of suicide attempt -Will resume her home meds of fluoxetine 20 mg p.o. daily, haloperidol 10 mg p.o. twice daily, lamotrigine 200 mg p.o. nightly, clonazepam 1 mg p.o. twice daily as well as 1 mg of IV lorazepam q. for as needed anxiety if needed   Essential Hypertension -Blood pressure at this point is stable.   -Hold Home Atenolol-Chlorthalidone -If necessary will use IV medicine if needed for blood pressure control. -Continue to Monitor BP per Protocol  -Last BP reading was 138/84  Leukocytosis -WBC Trend: Recent Labs  Lab 05/17/22 1430 05/17/22 2043 05/18/22 0444  WBC 16.5* 15.2* 12.2*  -U/A done and showed Hazy Appearance, Moderate Hgb, Negative Leukocytes, Negative Nitrites, No Bacteria See, 0-5 RBC/HPF, 0-5 Squamous Epithelial Cells/HPF and 0-5  WBC -Continue to Monitor for S/Sx of Infection -Repeat CBC in the AM   Hypokalemia -Patient's K+ Level Trend: Recent Labs  Lab 05/17/22 1430 05/18/22 0444  K 2.6* 2.9*  -Replete with IV Kcl 30  mEQ and po Kcl 40 mEQ BID x2 however patient cannot take p.o.'s at this time so we will give another IV 30 mEQ and also replete with IV K-Phos 20 mmol -Continue to Monitor and Replete as Necessary -Repeat CMP in the AM   Hypophosphatemia -Phos Level Trend: Recent Labs  Lab 05/18/22 0444  PHOS 1.8*  -Replete with IV K-Phos 20 mmol Continue monitor and replete as necessary repeat phosphorus level in the a.m.  Thrombocytosis -Platelet Count Trend: Recent Labs  Lab 05/17/22 1430 05/17/22 2043 05/18/22 0444  PLT 452* 420* 444*  -Continue to Monitor and Trend for S/Sx of Bleeding; No overt bleeding noted -Repeat CBC in the AM   Super Morbid Obesity -Complicates overall prognosis and care -Estimated body mass index is 57.82 kg/m as calculated from the following:   Height as of this encounter: '5\' 8"'$  (1.727 m).   Weight as of this encounter: 172.5 kg.  -Weight Loss and Dietary Counseling given  DVT prophylaxis: Enoxaparin 80 mg subcu every 24    Code Status: Full Code Family Communication: No family currently at bedside  Disposition Plan:  Level of care: Telemetry Status is: Inpatient Remains inpatient appropriate because: Needs further clinical improvement and ability to tolerate p.o.   Consultants:  None  Procedures:  As delineated as above  Antimicrobials:  Anti-infectives (From admission, onward)    None       Subjective: Bedside and she is still very nauseous had some abdominal discomfort.  Did not want to advance past clear liquid diet given her symptoms.  Continue to have some abdominal pain and multiple episodes of emesis.  Potassium still remains on the lower side.  Does not feel as well.  No other concerns or complaints this time.  Objective: Vitals:   05/17/22 2038 05/17/22 2114 05/18/22 0051 05/18/22 0606  BP:  (!) 141/93 (!) 139/94 (!) 141/84  Pulse:  78 85 94  Resp:  '16 20 20  '$ Temp:  98.2 F (36.8 C) 98.1 F (36.7 C) 98.3 F (36.8 C)  TempSrc:   Oral Oral Oral  SpO2:  99% 100% 100%  Weight: (!) 172.5 kg     Height:        Intake/Output Summary (Last 24 hours) at 05/18/2022 0845 Last data filed at 05/18/2022 0630 Gross per 24 hour  Intake 1275.19 ml  Output 300 ml  Net 975.19 ml   Filed Weights   05/17/22 2038  Weight: (!) 172.5 kg   Examination: Physical Exam:  Constitutional: WN/WD super morbidly obese African-American female who appears uncomfortable and nauseous Respiratory: Diminished to auscultation bilaterally with coarse breath sounds, no wheezing, rales, rhonchi or crackles. Normal respiratory effort and patient is not tachypenic. No accessory muscle use.  Unlabored breathing or any supplemental oxygen nasal cannula Cardiovascular: RRR, no murmurs / rubs / gallops. S1 and S2 auscultated no appreciable extremity edema. Abdomen: Soft, tender to palpate and distended secondary to body habitus. Bowel sounds positive.  GU: Deferred. Musculoskeletal: No clubbing / cyanosis of digits/nails. No joint deformity upper and lower extremities.  Skin: No rashes, lesions, ulcers on limited skin evaluation. No induration; Warm and dry.  Neurologic: CN 2-12 grossly intact with no focal deficits.  Romberg sign and cerebellar reflexes not assessed.  Psychiatric: Normal judgment  and insight. Alert and oriented x 3. Normal mood and appropriate affect.   Data Reviewed: I have personally reviewed following labs and imaging studies  CBC: Recent Labs  Lab 05/17/22 1430 05/17/22 2043 05/18/22 0444  WBC 16.5* 15.2* 12.2*  NEUTROABS 12.7*  --   --   HGB 12.7 12.1 12.8  HCT 38.9 37.0 40.0  MCV 85.1 85.6 87.3  PLT 452* 420* [id number]*   Basic Metabolic Panel: Recent Labs  Lab 05/17/22 1430 05/17/22 2043 05/18/22 0444  NA 140  --  138  K 2.6*  --  2.9*  CL 100  --  102  CO2 27  --  25  GLUCOSE 126*  --  158*  BUN 10  --  8  CREATININE 0.80 0.73 0.75  CALCIUM 8.6*  --  8.2*  MG 1.9  --   --    GFR: Estimated Creatinine  Clearance: 175.8 mL/min (by C-G formula based on SCr of 0.75 mg/dL). Liver Function Tests: Recent Labs  Lab 05/17/22 1430 05/18/22 0444  AST 24 19  ALT 21 20  ALKPHOS 89 84  BILITOT 0.2* 0.4  PROT 6.9 7.0  ALBUMIN 4.3 4.1   Recent Labs  Lab 05/17/22 1430  LIPASE 31   No results for input(s): "AMMONIA" in the last 168 hours. Coagulation Profile: No results for input(s): "INR", "PROTIME" in the last 168 hours. Cardiac Enzymes: No results for input(s): "CKTOTAL", "CKMB", "CKMBINDEX", "TROPONINI" in the last 168 hours. BNP (last 3 results) No results for input(s): "PROBNP" in the last 8760 hours. HbA1C: No results for input(s): "HGBA1C" in the last 72 hours. CBG: No results for input(s): "GLUCAP" in the last 168 hours. Lipid Profile: No results for input(s): "CHOL", "HDL", "LDLCALC", "TRIG", "CHOLHDL", "LDLDIRECT" in the last 72 hours. Thyroid Function Tests: No results for input(s): "TSH", "T4TOTAL", "FREET4", "T3FREE", "THYROIDAB" in the last 72 hours. Anemia Panel: No results for input(s): "VITAMINB12", "FOLATE", "FERRITIN", "TIBC", "IRON", "RETICCTPCT" in the last 72 hours. Sepsis Labs: No results for input(s): "PROCALCITON", "LATICACIDVEN" in the last 168 hours.  No results found for this or any previous visit (from the past 240 hour(s)).   Radiology Studies: No results found.  Scheduled Meds:  enoxaparin (LOVENOX) injection  80 mg Subcutaneous Q24H   metoCLOPramide (REGLAN) injection  10 mg Intravenous Q6H   potassium chloride  40 mEq Oral BID   Continuous Infusions:  dextrose 5% lactated ringers with KCl 20 mEq/L 125 mL/hr at 05/18/22 0630   potassium chloride      LOS: 1 day   Raiford Noble, DO [facility] Available via Epic secure chat 7am-7pm After these hours, please refer to coverage provider listed on [email or web address] 05/18/2022, 8:45 AM

## 2022-05-19 DIAGNOSIS — K3184 Gastroparesis: Secondary | ICD-10-CM | POA: Diagnosis not present

## 2022-05-19 DIAGNOSIS — F3111 Bipolar disorder, current episode manic without psychotic features, mild: Secondary | ICD-10-CM | POA: Diagnosis not present

## 2022-05-19 DIAGNOSIS — R112 Nausea with vomiting, unspecified: Secondary | ICD-10-CM | POA: Diagnosis not present

## 2022-05-19 LAB — CBC WITH DIFFERENTIAL/PLATELET
Abs Immature Granulocytes: 0.04 10*3/uL (ref 0.00–0.07)
Basophils Absolute: 0.1 10*3/uL (ref 0.0–0.1)
Basophils Relative: 1 %
Eosinophils Absolute: 0.8 10*3/uL — ABNORMAL HIGH (ref 0.0–0.5)
Eosinophils Relative: 7 %
HCT: 34.7 % — ABNORMAL LOW (ref 36.0–46.0)
Hemoglobin: 11.1 g/dL — ABNORMAL LOW (ref 12.0–15.0)
Immature Granulocytes: 0 %
Lymphocytes Relative: 40 %
Lymphs Abs: 4.6 10*3/uL — ABNORMAL HIGH (ref 0.7–4.0)
MCH: 28.1 pg (ref 26.0–34.0)
MCHC: 32 g/dL (ref 30.0–36.0)
MCV: 87.8 fL (ref 80.0–100.0)
Monocytes Absolute: 0.9 10*3/uL (ref 0.1–1.0)
Monocytes Relative: 8 %
Neutro Abs: 5 10*3/uL (ref 1.7–7.7)
Neutrophils Relative %: 44 %
Platelets: 340 10*3/uL (ref 150–400)
RBC: 3.95 MIL/uL (ref 3.87–5.11)
RDW: 14.6 % (ref 11.5–15.5)
WBC: 11.5 10*3/uL — ABNORMAL HIGH (ref 4.0–10.5)
nRBC: 0 % (ref 0.0–0.2)

## 2022-05-19 LAB — COMPREHENSIVE METABOLIC PANEL
ALT: 40 U/L (ref 0–44)
AST: 39 U/L (ref 15–41)
Albumin: 3.5 g/dL (ref 3.5–5.0)
Alkaline Phosphatase: 80 U/L (ref 38–126)
Anion gap: 13 (ref 5–15)
BUN: <5 mg/dL — ABNORMAL LOW (ref 6–20)
CO2: 24 mmol/L (ref 22–32)
Calcium: 8 mg/dL — ABNORMAL LOW (ref 8.9–10.3)
Chloride: 103 mmol/L (ref 98–111)
Creatinine, Ser: 0.73 mg/dL (ref 0.44–1.00)
GFR, Estimated: >60 mL/min (ref >60)
Glucose, Bld: 122 mg/dL — ABNORMAL HIGH (ref 70–99)
Potassium: 3 mmol/L — ABNORMAL LOW (ref 3.5–5.1)
Sodium: 140 mmol/L (ref 135–145)
Total Bilirubin: 0.4 mg/dL (ref 0.3–1.2)
Total Protein: 6.2 g/dL — ABNORMAL LOW (ref 6.5–8.1)

## 2022-05-19 LAB — PHOSPHORUS: Phosphorus: 1.8 mg/dL — ABNORMAL LOW (ref 2.5–4.6)

## 2022-05-19 LAB — MAGNESIUM: Magnesium: 1.9 mg/dL (ref 1.7–2.4)

## 2022-05-19 MED ORDER — POTASSIUM CHLORIDE 10 MEQ/100ML IV SOLN
10.0000 meq | INTRAVENOUS | Status: AC
  Administered 2022-05-19 (×4): 10 meq via INTRAVENOUS
  Filled 2022-05-19 (×4): qty 100

## 2022-05-19 MED ORDER — ZOLPIDEM TARTRATE 5 MG PO TABS
5.0000 mg | ORAL_TABLET | Freq: Every evening | ORAL | Status: DC | PRN
  Administered 2022-05-19 – 2022-05-20 (×2): 5 mg via ORAL
  Filled 2022-05-19 (×2): qty 1

## 2022-05-19 MED ORDER — POTASSIUM PHOSPHATES 15 MMOLE/5ML IV SOLN
30.0000 mmol | Freq: Once | INTRAVENOUS | Status: AC
  Administered 2022-05-19: 30 mmol via INTRAVENOUS
  Filled 2022-05-19: qty 10

## 2022-05-19 MED ORDER — PANTOPRAZOLE SODIUM 40 MG IV SOLR
40.0000 mg | Freq: Two times a day (BID) | INTRAVENOUS | Status: DC
  Administered 2022-05-19 – 2022-05-20 (×3): 40 mg via INTRAVENOUS
  Filled 2022-05-19 (×3): qty 10

## 2022-05-19 NOTE — Progress Notes (Signed)
  Transition of Care [facility]) Screening Note   Patient Details  Name: Deborah Kelly Date of Birth: October 06, 1990   Transition of [facility]) CM/SW Contact:    Vassie Moselle, LCSW Phone Number: 05/19/2022, 9:05 AM    Transition of Care Department [facility]) has reviewed patient and no TOC needs have been identified at this time. We will continue to monitor patient advancement through interdisciplinary progression rounds. If new patient transition needs arise, please place a TOC consult.

## 2022-05-19 NOTE — Plan of Care (Signed)
Progressing

## 2022-05-19 NOTE — Plan of Care (Signed)

## 2022-05-19 NOTE — Progress Notes (Signed)
PROGRESS NOTE    Jestine Thornsbury  [id number] DOB: 1990/11/03 DOA: 05/17/2022 PCP: Pcp, No   Brief Narrative:  The patient is a 32 year old morbidly obese African-American female with past medical history significant for but not limited to gastroparesis, bipolar 1 disorder, cannabis abuse depression, history of hypothyroidism and Graves' disease as well as other comorbidities who had intensive workup and treatment for gastroparesis and multiple hospitalizations including [place] where she currently goes for care.  Given complaint of intractable nausea vomiting and unable to take her home regimen clear home medications and felt dehydrated.  She is found have a potassium of 2.6 in the ED complain some abdominal discomfort and pain.  In the ED she received Haldol and Phenergan and she improved slowly but remained weak.  Her potassium remained on the lower side and given that she is not able to really keep anything down she is admitted for intractable nausea vomiting likely in the setting of idiopathic gastroparesis flare with possible hyperemesis cannabinoid syndrome.   Currently she is getting IV fluid resuscitation and has supportive care measures including antiemetics.  Diet is a clear liquid diet for now.  Assessment and Plan:  Intractable nausea vomiting in the setting of idiopathic gastroparesis flare with possible concomitant hyperemesis cannabinoid syndrome -Patient has chronic gastroparesis.   -Continue supportive care and continue with IV fluid hydration with D5 and LR at 125 MLS per hour for now -Continue with antiemetics with IV ondansetron and IV prochlorperazine -Also continue with IV metoclopramide 10 mg every 6 scheduled -Unclear cause of her gastroparesis and she has been seeing several specialist and has seen GI in outpatient setting with [facility] and they have referred her to [facility] and she had a repeat gastric emptying study done which showed that she had to 61% of the food  that she was retaining -Will slightly go up on her diet from clear liquid diet to full liquid diet -She denies being diabetic we will obtain blood sugars and check her hemoglobin A1c -We we will also check a TSH -Continue pain control with IV ketorolac 50 mg every 6 as needed for severe pain for 5 days; judicious use of narcotics given her gastroparesis as narcotics can worsen the flare and have initiated on IV hydromorphone 1 g every 4 hours as needed -Since he is very slow to improve we have consulted GI and Dr. Alessandra Bevels will see the patient in the morning given that she's seen [facility]; [facility] has referred her to [place] is in the process of seeing her -Continue with IV pantoprazole but will go from Daily to BID -Hold Gabapentin for now and resume if necessary -May need Erythromycin -Keep blood sugars under 200 -Intractable nausea vomiting can also be in the setting Hyperemesis Cannabinoid Syndrome and she did test Positive for [facility] -Patient is having some abdominal discomfort   Idiopathic Gastroparesis  -As above -Consulting GI for further evaluation     History of Hypothyroidism with Graves' disease -Does not appear to be taking any medications for this   Bipolar 1 Disorder Depression History of suicide attempt -Will resume her home meds of haloperidol 10 mg p.o. twice daily, lamotrigine 200 mg p.o. nightly, clonazepam 1 mg p.o. twice daily as well as 1 mg of IV lorazepam q. for as needed anxiety if needed -No longer taking fluoxetine -Will add zolpidem 5 mg p.o. daily nightly for sleep   Essential Hypertension -Blood pressure at this point is stable.   -Hold Home Atenolol-Chlorthalidone -  If necessary will use IV medicine if needed for blood pressure control. -Continue to Monitor BP per Protocol  -Last BP reading was 139/84   Leukocytosis -WBC Trend: Recent Labs  Lab 05/17/22 1430 05/17/22 2043 05/18/22 0444 05/19/22 0530  WBC 16.5* 15.2* 12.2* 11.5*  -U/A  done and showed Hazy Appearance, Moderate Hgb, Negative Leukocytes, Negative Nitrites, No Bacteria See, 0-5 RBC/HPF, 0-5 Squamous Epithelial Cells/HPF and 0-5 WBC -Continue to Monitor for S/Sx of Infection -Repeat CBC in the AM    Hypokalemia -Patient's K+ Level Trend: Recent Labs  Lab 05/17/22 1430 05/18/22 0444 05/19/22 0530  K 2.6* 2.9* 3.0*  -Replete with IV KCl 40 mEQ and with IV K-Phos 30 mmol -Continue to Monitor and Replete as Necessary -Repeat CMP in the AM    Hypophosphatemia -Phos Level Trend: Recent Labs  Lab 05/18/22 0444 05/19/22 0530  PHOS 1.8* 1.8*  -Replete with IV K-Phos 30 mmol Continue monitor and replete as necessary repeat phosphorus level in the a.m.   Thrombocytosis -Platelet Count Trend: Recent Labs  Lab 05/17/22 1430 05/17/22 2043 05/18/22 0444 05/19/22 0530  PLT 452* 420* 444* 340  -Continue to Monitor and Trend for S/Sx of Bleeding; No overt bleeding noted -Repeat CBC in the AM    Super Morbid Obesity -Complicates overall prognosis and care -Estimated body mass index is 57.82 kg/m as calculated from the following:   Height as of this encounter: '5\' 8"'$  (1.727 m).   Weight as of this encounter: 172.5 kg.  -Weight Loss and Dietary Counseling given  DVT prophylaxis: Enoxaparin 80 mg subcu every 24    Code Status: Full Code Family Communication: No family currently at bedside  Disposition Plan:  Level of care: Telemetry Status is: Inpatient Remains inpatient appropriate because: Needs further clinical improvement and tolerance of diet.   Consultants:  Gastroenterology  Procedures:  As delineated as above  Antimicrobials:  Anti-infectives (From admission, onward)    None       Subjective: Seen and examined at bedside she is still having some nausea and abdominal discomfort.  Is having more abdominal discomfort and nausea and nausea is slowly improving.  Denies any lightheadedness or dizziness.  Feels okay.  States that this  has happened to her multiple times and she is very slow to improve.  Denies any other concerns or complaints at this time.  Objective: Vitals:   05/18/22 1324 05/18/22 2137 05/19/22 0514 05/19/22 1236  BP: 108/73 122/72 (!) 140/64 139/84  Pulse: (!) 103 84 87 82  Resp: 20 17 (!) 22 20  Temp: 98 F (36.7 C) 97.6 F (36.4 C) 98 F (36.7 C) 98 F (36.7 C)  TempSrc: Oral Oral Oral Oral  SpO2: 97% 98% 95% 97%  Weight:      Height:        Intake/Output Summary (Last 24 hours) at 05/19/2022 1636 Last data filed at 05/19/2022 [id number] Gross per 24 hour  Intake 2671.52 ml  Output --  Net 2671.52 ml   Filed Weights   05/17/22 2038  Weight: (!) 172.5 kg   Examination: Physical Exam:  Constitutional: WN/WD super morbidly obese African-American female who is to complain of some abdominal discomfort and appears a little uncomfortable Respiratory: Diminished to auscultation bilaterally with coarse breath sounds, no wheezing, rales, rhonchi or crackles. Normal respiratory effort and patient is not tachypenic. No accessory muscle use.  Unlabored breathing Cardiovascular: RRR, no murmurs / rubs / gallops. S1 and S2 auscultated. No extremity edema.  Abdomen: Soft, tender  to palpate and distended secondary to body habitus. Bowel sounds positive.  GU: Deferred. Musculoskeletal: No clubbing / cyanosis of digits/nails. Normal strength and muscle tone.  Skin: No rashes, lesions, ulcers on limited skin evaluation. No induration; Warm and dry.  Neurologic: CN 2-12 grossly intact with no focal deficits. Romberg sign and cerebellar reflexes not assessed.  Psychiatric: Normal judgment and insight. Alert and oriented x 3.  Slightly anxious mood and appropriate affect.   Data Reviewed: I have personally reviewed following labs and imaging studies  CBC: Recent Labs  Lab 05/17/22 1430 05/17/22 2043 05/18/22 0444 05/19/22 0530  WBC 16.5* 15.2* 12.2* 11.5*  NEUTROABS 12.7*  --   --  5.0  HGB 12.7 12.1  12.8 11.1*  HCT 38.9 37.0 40.0 34.7*  MCV 85.1 85.6 87.3 87.8  PLT 452* 420* 444* [id number]   Basic Metabolic Panel: Recent Labs  Lab 05/17/22 1430 05/17/22 2043 05/18/22 0444 05/19/22 0530  NA 140  --  138 140  K 2.6*  --  2.9* 3.0*  CL 100  --  102 103  CO2 27  --  25 24  GLUCOSE 126*  --  158* 122*  BUN 10  --  8 <5*  CREATININE 0.80 0.73 0.75 0.73  CALCIUM 8.6*  --  8.2* 8.0*  MG 1.9  --  2.2 1.9  PHOS  --   --  1.8* 1.8*   GFR: Estimated Creatinine Clearance: 175.8 mL/min (by C-G formula based on SCr of 0.73 mg/dL). Liver Function Tests: Recent Labs  Lab 05/17/22 1430 05/18/22 0444 05/19/22 0530  AST 24 19 39  ALT 21 20 40  ALKPHOS 89 84 80  BILITOT 0.2* 0.4 0.4  PROT 6.9 7.0 6.2*  ALBUMIN 4.3 4.1 3.5   Recent Labs  Lab 05/17/22 1430  LIPASE 31   No results for input(s): "AMMONIA" in the last 168 hours. Coagulation Profile: No results for input(s): "INR", "PROTIME" in the last 168 hours. Cardiac Enzymes: No results for input(s): "CKTOTAL", "CKMB", "CKMBINDEX", "TROPONINI" in the last 168 hours. BNP (last 3 results) No results for input(s): "PROBNP" in the last 8760 hours. HbA1C: No results for input(s): "HGBA1C" in the last 72 hours. CBG: No results for input(s): "GLUCAP" in the last 168 hours. Lipid Profile: No results for input(s): "CHOL", "HDL", "LDLCALC", "TRIG", "CHOLHDL", "LDLDIRECT" in the last 72 hours. Thyroid Function Tests: No results for input(s): "TSH", "T4TOTAL", "FREET4", "T3FREE", "THYROIDAB" in the last 72 hours. Anemia Panel: No results for input(s): "VITAMINB12", "FOLATE", "FERRITIN", "TIBC", "IRON", "RETICCTPCT" in the last 72 hours. Sepsis Labs: No results for input(s): "PROCALCITON", "LATICACIDVEN" in the last 168 hours.  No results found for this or any previous visit (from the past 240 hour(s)).   Radiology Studies: CT HEAD WO CONTRAST (5MM)  Result Date: 05/18/2022 CLINICAL DATA:  Vertigo.  Headache.  Syncope/presyncope.  EXAM: CT HEAD WITHOUT CONTRAST TECHNIQUE: Contiguous axial images were obtained from the base of the skull through the vertex without intravenous contrast. RADIATION DOSE REDUCTION: This exam was performed according to the departmental dose-optimization program which includes automated exposure control, adjustment of the mA and/or kV according to patient size and/or use of iterative reconstruction technique. COMPARISON:  None Available. FINDINGS: Brain: The brain shows a normal appearance without evidence of malformation, atrophy, old or acute small or large vessel infarction, mass lesion, hemorrhage, hydrocephalus or extra-axial collection. Vascular: No hyperdense vessel. No evidence of atherosclerotic calcification. Skull: Normal.  No traumatic finding.  No focal bone lesion. Sinuses/Orbits: Sinuses  are clear. Orbits appear normal. Mastoids are clear. Other: None significant IMPRESSION: Normal head CT. Electronically Signed   By: Nelson Chimes M.D.   On: 05/18/2022 14:59    Scheduled Meds:  clonazePAM  1 mg Oral BID   enoxaparin (LOVENOX) injection  80 mg Subcutaneous Q24H   haloperidol  10 mg Oral BID   lamoTRIgine  200 mg Oral QHS   metoCLOPramide (REGLAN) injection  10 mg Intravenous Q6H   pantoprazole (PROTONIX) IV  40 mg Intravenous Daily   Continuous Infusions:  dextrose 5% lactated ringers with KCl 20 mEq/L 125 mL/hr at 05/19/22 0801   potassium PHOSPHATE IVPB (in mmol)      LOS: 2 days   Raiford Noble, DO [facility] Available via Epic secure chat 7am-7pm After these hours, please refer to coverage provider listed on [email or web address] 05/19/2022, 4:36 PM

## 2022-05-20 LAB — CBC WITH DIFFERENTIAL/PLATELET
Abs Immature Granulocytes: 0.06 10*3/uL (ref 0.00–0.07)
Basophils Absolute: 0.1 10*3/uL (ref 0.0–0.1)
Basophils Relative: 1 %
Eosinophils Absolute: 0.7 10*3/uL — ABNORMAL HIGH (ref 0.0–0.5)
Eosinophils Relative: 7 %
HCT: 34.9 % — ABNORMAL LOW (ref 36.0–46.0)
Hemoglobin: 11.1 g/dL — ABNORMAL LOW (ref 12.0–15.0)
Immature Granulocytes: 1 %
Lymphocytes Relative: 42 %
Lymphs Abs: 4.2 10*3/uL — ABNORMAL HIGH (ref 0.7–4.0)
MCH: 27.6 pg (ref 26.0–34.0)
MCHC: 31.8 g/dL (ref 30.0–36.0)
MCV: 86.8 fL (ref 80.0–100.0)
Monocytes Absolute: 0.7 10*3/uL (ref 0.1–1.0)
Monocytes Relative: 7 %
Neutro Abs: 4.3 10*3/uL (ref 1.7–7.7)
Neutrophils Relative %: 42 %
Platelets: 367 10*3/uL (ref 150–400)
RBC: 4.02 MIL/uL (ref 3.87–5.11)
RDW: 14.1 % (ref 11.5–15.5)
WBC: 10 10*3/uL (ref 4.0–10.5)
nRBC: 0 % (ref 0.0–0.2)

## 2022-05-20 LAB — COMPREHENSIVE METABOLIC PANEL
ALT: 42 U/L (ref 0–44)
AST: 31 U/L (ref 15–41)
Albumin: 3.2 g/dL — ABNORMAL LOW (ref 3.5–5.0)
Alkaline Phosphatase: 84 U/L (ref 38–126)
Anion gap: 8 (ref 5–15)
BUN: <5 mg/dL — ABNORMAL LOW (ref 6–20)
CO2: 24 mmol/L (ref 22–32)
Calcium: 7.7 mg/dL — ABNORMAL LOW (ref 8.9–10.3)
Chloride: 103 mmol/L (ref 98–111)
Creatinine, Ser: 0.62 mg/dL (ref 0.44–1.00)
GFR, Estimated: >60 mL/min (ref >60)
Glucose, Bld: 122 mg/dL — ABNORMAL HIGH (ref 70–99)
Potassium: 3 mmol/L — ABNORMAL LOW (ref 3.5–5.1)
Sodium: 135 mmol/L (ref 135–145)
Total Bilirubin: 0.7 mg/dL (ref 0.3–1.2)
Total Protein: 5.9 g/dL — ABNORMAL LOW (ref 6.5–8.1)

## 2022-05-20 LAB — PHOSPHORUS: Phosphorus: 2.6 mg/dL (ref 2.5–4.6)

## 2022-05-20 LAB — HEMOGLOBIN A1C
Hgb A1c MFr Bld: 6.8 % — ABNORMAL HIGH (ref 4.8–5.6)
Mean Plasma Glucose: 148 mg/dL

## 2022-05-20 LAB — MAGNESIUM: Magnesium: 1.8 mg/dL (ref 1.7–2.4)

## 2022-05-20 MED ORDER — BISACODYL 5 MG PO TBEC
10.0000 mg | DELAYED_RELEASE_TABLET | Freq: Every day | ORAL | Status: DC
  Administered 2022-05-20 – 2022-05-22 (×3): 10 mg via ORAL
  Filled 2022-05-20 (×3): qty 2

## 2022-05-20 MED ORDER — POTASSIUM CHLORIDE 2 MEQ/ML IV SOLN
INTRAVENOUS | Status: DC
  Filled 2022-05-20 (×7): qty 1000

## 2022-05-20 MED ORDER — GABAPENTIN 300 MG PO CAPS
300.0000 mg | ORAL_CAPSULE | Freq: Three times a day (TID) | ORAL | Status: DC
  Administered 2022-05-20 – 2022-05-22 (×6): 300 mg via ORAL
  Filled 2022-05-20 (×6): qty 1

## 2022-05-20 MED ORDER — PROCHLORPERAZINE MALEATE 10 MG PO TABS
5.0000 mg | ORAL_TABLET | Freq: Four times a day (QID) | ORAL | Status: DC | PRN
  Administered 2022-05-20 – 2022-05-22 (×4): 5 mg via ORAL
  Filled 2022-05-20 (×4): qty 1

## 2022-05-20 MED ORDER — METFORMIN HCL ER 500 MG PO TB24
500.0000 mg | ORAL_TABLET | Freq: Every day | ORAL | Status: DC
  Administered 2022-05-21 – 2022-05-22 (×2): 500 mg via ORAL
  Filled 2022-05-20 (×2): qty 1

## 2022-05-20 NOTE — Plan of Care (Signed)
Education provided

## 2022-05-20 NOTE — Progress Notes (Signed)
PROGRESS NOTE   Deborah Kelly  [id number] DOB: [birth year] DOA: 05/17/2022 PCP: Pcp, No  Brief Narrative:  32 year old female Cannabis abuse with gastroparesis Bipolar 1 disorder Hypothyroidism secondary to Graves' Chronic pelvic pain on gabapentin Second opinion 03/27/2022 [facility]-underwent abdominal surgery for PID and developed N/V with workup revealing gastric emptying prior therapy included Reglan and erythromycin and on recent admission had to use Ativan amitriptyline and P--gastric emptying study 07/2021 on opioids 33% emptying at 4 hours EGD showed grade C esophagitis Prucalopride  Came to [facility] ED 3/10 with vomiting stomach cramping and heart fluttering Potassium 2.6 In ED Rx Haldol Phenergan with mild relief EKG no change Admitted because of difficulty keeping anything down--started on Reglan and promethazine   Hospital-Problem based course  Intractable nausea vomiting in setting of idiopathic gastroparesis?  Con commitment hyperemesis cannabinoid syndrome -Likely needs to quit cannabis completely-I have counseled her to stop - Seem to have relief with Compazine so we will change to p.o. Compazine -Patient encouraged to move around and ambulate  Moderate to severe hypokalemia and hypophosphatemia - D5 changed to 40 of K at 100 cc/H - Check phosphorus again in a.m.  Hypothyroidism with Graves' disease -Not on any replacement  Bipolar 1 depression history of suicide -Continue clonazepam 1 mg twice daily, Haldol 10 twice daily, Lamictal 200 daily  Chronic pain syndrome with chronic pelvic pain on gabapentin -Gabapentin 300 3 times daily resume don 3/13  HTN -Chlorthalidone held (Tenoretic) - Blood pressures are relatively normal so we will just observe  Leukocytosis -Resolved on its own - Monitor trends periodically  Super morbid obesity BMI 57 Probable impaired glucose tolerance - Resume metformin 500   DVT prophylaxis: Lovenox Code Status: Full Family  Communication: None present at this time Disposition:  Status is: Inpatient Remains inpatient appropriate because:   Has not tolerated oral diet as yet not ready for discharge    Subjective: No vomit X 2 days but not really eating only on liquids States Compazine helps Has had smears of stool but no full stool yet Overall her trajectory has stalled GI consult in advance appreciated  Objective: Vitals:   05/19/22 0514 05/19/22 1236 05/19/22 2144 05/20/22 0435  BP: (!) 140/64 139/84 137/84 128/83  Pulse: 87 82 75 94  Resp: (!) '22 20 16 18  '$ Temp: 98 F (36.7 C) 98 F (36.7 C) 98.4 F (36.9 C) 97.8 F (36.6 C)  TempSrc: Oral Oral Oral Oral  SpO2: 95% 97% 99% 97%  Weight:      Height:        Intake/Output Summary (Last 24 hours) at 05/20/2022 [id number] Last data filed at 05/20/2022 0400 Gross per 24 hour  Intake 3305.99 ml  Output --  Net 3305.99 ml   Filed Weights   05/17/22 2038  Weight: (!) 172.5 kg    Examination:  EOMI NCAT no focal deficit no icterus no pallor Very thick neck Mallampati 4 S1-S2 no murmur tattoos noted Abdomen obese nontender no rebound no guarding No lower extremity edema Chest clear no Neurologically intact no focal deficit to gross exam full exam deferred Psych-affect is flat  Data Reviewed: personally reviewed   CBC    Component Value Date/Time   WBC 10.0 05/20/2022 0542   RBC 4.02 05/20/2022 0542   HGB 11.1 (L) 05/20/2022 0542   HCT 34.9 (L) 05/20/2022 0542   PLT 367 05/20/2022 0542   MCV 86.8 05/20/2022 0542   MCH 27.6 05/20/2022 0542   MCHC 31.8 05/20/2022 0542  RDW 14.1 05/20/2022 0542   LYMPHSABS 4.2 (H) 05/20/2022 0542   MONOABS 0.7 05/20/2022 0542   EOSABS 0.7 (H) 05/20/2022 0542   BASOSABS 0.1 05/20/2022 0542      Latest Ref Rng & Units 05/20/2022    5:42 AM 05/19/2022    5:30 AM 05/18/2022    4:44 AM  CMP  Glucose 70 - 99 mg/dL 122  122  158   BUN 6 - 20 mg/dL <5  <5  8   Creatinine 0.44 - 1.00 mg/dL 0.62  0.73   0.75   Sodium 135 - 145 mmol/L 135  140  138   Potassium 3.5 - 5.1 mmol/L 3.0  3.0  2.9   Chloride 98 - 111 mmol/L 103  103  102   CO2 22 - 32 mmol/L '24  24  25   '$ Calcium 8.9 - 10.3 mg/dL 7.7  8.0  8.2   Total Protein 6.5 - 8.1 g/dL 5.9  6.2  7.0   Total Bilirubin 0.3 - 1.2 mg/dL 0.7  0.4  0.4   Alkaline Phos 38 - 126 U/L 84  80  84   AST 15 - 41 U/L 31  39  19   ALT 0 - 44 U/L 42  40  20      Radiology Studies: CT HEAD WO CONTRAST (5MM)  Result Date: 05/18/2022 CLINICAL DATA:  Vertigo.  Headache.  Syncope/presyncope. EXAM: CT HEAD WITHOUT CONTRAST TECHNIQUE: Contiguous axial images were obtained from the base of the skull through the vertex without intravenous contrast. RADIATION DOSE REDUCTION: This exam was performed according to the departmental dose-optimization program which includes automated exposure control, adjustment of the mA and/or kV according to patient size and/or use of iterative reconstruction technique. COMPARISON:  None Available. FINDINGS: Brain: The brain shows a normal appearance without evidence of malformation, atrophy, old or acute small or large vessel infarction, mass lesion, hemorrhage, hydrocephalus or extra-axial collection. Vascular: No hyperdense vessel. No evidence of atherosclerotic calcification. Skull: Normal.  No traumatic finding.  No focal bone lesion. Sinuses/Orbits: Sinuses are clear. Orbits appear normal. Mastoids are clear. Other: None significant IMPRESSION: Normal head CT. Electronically Signed   By: Nelson Chimes M.D.   On: 05/18/2022 14:59     Scheduled Meds:  bisacodyl  10 mg Oral Daily   clonazePAM  1 mg Oral BID   enoxaparin (LOVENOX) injection  80 mg Subcutaneous Q24H   gabapentin  300 mg Oral TID   haloperidol  10 mg Oral BID   lamoTRIgine  200 mg Oral QHS   metFORMIN  500 mg Oral Daily   metoCLOPramide (REGLAN) injection  10 mg Intravenous Q6H   pantoprazole (PROTONIX) IV  40 mg Intravenous Q12H   Continuous Infusions:  dextrose 5 %  1,000 mL with potassium chloride 40 mEq infusion       LOS: 3 days   Time spent: [place], MD [facility] To contact the attending provider between 7A-7P or the covering provider during after hours 7P-7A, please log into the web site [email or web address] and access using universal  password for that web site. If you do not have the password, please call the hospital operator.  05/20/2022, 7:12 AM

## 2022-05-20 NOTE — Consult Note (Signed)
Referring Provider:  [facility] Primary Care Physician:  Pcp, No Primary Gastroenterologist: [facility]  Reason for Consultation: Nausea and vomiting, refractory gastroparesis  HPI: Deborah Kelly is a 32 y.o. female with past medical history of gastroparesis, Graves' disease, cannabis induced hyperemesis, bipolar disorder, PTSD and alcohol use presented to the hospital with intractable nausea and vomiting.  Patient has tried Reglan and erythromycin in the past.  She was seen in the [place] clinic 1 time in 2023 and was referred to GI.  She was seen at [facility] in January 2024.  They had recommended to avoid cannabis use and to take gastroparesis diet as well as trial of Prucalipride 2 mg once daily .  Patient has follow-up with them on June 26, 2022.    Patient seen and examined at bedside.  Nausea has improved since starting Compazine.  She is currently on liquid diet.  She is complaining of constipation with small bowel movements per day.  Passing gas.  Denies any vomiting today.    Past Medical History:  Diagnosis Date   Alcohol abuse 05/19/2017   Anxiety    Bipolar 1 disorder ([place])    Bipolar affective disorder, depressed ([place]) 01/20/2012   Cannabis misuse 03/15/2021   Chronic pelvic pain in female 07/08/2020   Depression    Dysmenorrhea 07/08/2020   Elevated prolactin level 03/15/2021   Gastroparesis    Graves disease    History of suicide attempt 05/18/2017   Hyperthyroidism    Intractable vomiting 04/27/2021    History reviewed. No pertinent surgical history.  Prior to Admission medications   Medication Sig Start Date End Date Taking? Authorizing Provider  atenolol-chlorthalidone (TENORETIC) 100-25 MG tablet Take 1 tablet by mouth at bedtime.   Yes [provider]  clonazePAM (KLONOPIN) 1 MG tablet Take 1 mg by mouth 2 (two) times daily. 04/17/21  Yes [provider]  FLUoxetine (PROZAC) 20 MG capsule Take 20 mg by mouth daily.   Yes [provider]  gabapentin  (NEURONTIN) 300 MG capsule Take 300 mg by mouth 3 (three) times daily.   Yes [provider]  haloperidol (HALDOL) 10 MG tablet Take 10 mg by mouth 2 (two) times daily.   Yes [provider]  lamoTRIgine (LAMICTAL) 200 MG tablet Take 1 tablet (200 mg total) by mouth daily. Patient taking differently: Take 200 mg by mouth at bedtime. 08/14/21  Yes Allie Bossier, MD  metFORMIN (GLUCOPHAGE-XR) 500 MG 24 hr tablet Take 500 mg by mouth daily.   Yes [provider]  ondansetron (ZOFRAN-ODT) 4 MG disintegrating tablet Take 4 mg by mouth every 8 (eight) hours as needed for vomiting or nausea. 03/27/22 07/25/22 Yes [provider]  promethazine (PHENERGAN) 12.5 MG tablet Take 2 tablets (25 mg total) by mouth every 6 (six) hours as needed for nausea or vomiting. 07/31/21  Yes Bonnielee Haff, MD  acetaminophen-codeine (TYLENOL #4) 300-60 MG tablet Take 1 tablet by mouth every 8 (eight) hours as needed. Patient not taking: Reported on 05/17/2022 08/01/21   Bonnielee Haff, MD  bisacodyl (DULCOLAX) 5 MG EC tablet Take 2 tablets (10 mg total) by mouth daily. Patient not taking: Reported on 05/17/2022 08/01/21   Bonnielee Haff, MD  erythromycin (E-MYCIN) 250 MG tablet Take 1 tablet (250 mg total) by mouth 3 (three) times daily before meals. Patient not taking: Reported on 09/02/2021 08/01/21   Bonnielee Haff, MD  metoCLOPramide (REGLAN) 10 MG tablet Take 1 tablet (10 mg total) by mouth 3 (three) times  daily with meals. Patient not taking: Reported on 05/17/2022 09/02/21 09/02/22  Fransico Meadow, PA-C  pantoprazole (PROTONIX) 40 MG tablet Take 1 tablet (40 mg total) by mouth daily. Patient not taking: Reported on 07/15/2021 04/29/21 04/29/22  Pokhrel, Corrie Mckusick, MD  phenol (CHLORASEPTIC) 1.4 % LIQD Use as directed 1 spray in the mouth or throat as needed for throat irritation / pain. Patient not taking: Reported on 05/17/2022 07/22/21   Allie Bossier, MD  polyethylene glycol (MIRALAX /  GLYCOLAX) 17 g packet Take 17 g by mouth daily. Patient not taking: Reported on 09/02/2021 08/01/21   Bonnielee Haff, MD    Scheduled Meds:  clonazePAM  1 mg Oral BID   enoxaparin (LOVENOX) injection  80 mg Subcutaneous Q24H   haloperidol  10 mg Oral BID   lamoTRIgine  200 mg Oral QHS   metoCLOPramide (REGLAN) injection  10 mg Intravenous Q6H   pantoprazole (PROTONIX) IV  40 mg Intravenous Q12H   Continuous Infusions:  dextrose 5 % 1,000 mL with potassium chloride 40 mEq infusion     PRN Meds:.HYDROmorphone (DILAUDID) injection, ketorolac, LORazepam, ondansetron (ZOFRAN) IV, prochlorperazine, promethazine, zolpidem  Allergies as of 05/17/2022 - Review Complete 05/17/2022  Allergen Reaction Noted   Penicillins Swelling and Rash 04/25/2021   Amlodipine Hives 11/14/2019   Dicyclomine Rash and Other (See Comments) 04/17/2016    History reviewed. No pertinent family history.  Social History   Socioeconomic History   Marital status: Single    Spouse name: Not on file   Number of children: Not on file   Years of education: Not on file   Highest education level: Not on file  Occupational History   Not on file  Tobacco Use   Smoking status: Former    Types: Cigarettes   Smokeless tobacco: Never  Vaping Use   Vaping Use: Every day   Substances: Nicotine, Flavoring  Substance and Sexual Activity   Alcohol use: Not Currently   Drug use: Yes    Types: Marijuana   Sexual activity: Yes  Other Topics Concern   Not on file  Social History Narrative   Not on file   Social Determinants of Health   Financial Resource Strain: Not on file  Food Insecurity: No Food Insecurity (05/17/2022)   Hunger Vital Sign    Worried About Running Out of Food in the Last Year: Never true    Ran Out of Food in the Last Year: Never true  Transportation Needs: No Transportation Needs (05/17/2022)   PRAPARE - Hydrologist (Medical): No    Lack of Transportation  (Non-Medical): No  Physical Activity: Not on file  Stress: Not on file  Social Connections: Not on file  Intimate Partner Violence: Not At Risk (05/17/2022)   Humiliation, Afraid, Rape, and Kick questionnaire    Fear of Current or Ex-Partner: No    Emotionally Abused: No    Physically Abused: No    Sexually Abused: No    Review of Systems: All negative except as stated above in HPI.  Physical Exam: Vital signs: Vitals:   05/19/22 2144 05/20/22 0435  BP: 137/84 128/83  Pulse: 75 94  Resp: 16 18  Temp: 98.4 F (36.9 C) 97.8 F (36.6 C)  SpO2: 99% 97%   Last BM Date : 05/19/22 General: Obese patient, resting comfortably, not in acute distress Lungs: No visible respiratory distress Heart:  Regular rate and rhythm; no murmurs, clicks, rubs,  or gallops. Abdomen: Difficult examination  because of body habitus.  Abdomen is soft, nontender, bowel sounds present, peritoneal signs Alert and oriented x 3 Mood and affect normal Rectal:  Deferred  GI:  Lab Results: Recent Labs    05/18/22 0444 05/19/22 0530 05/20/22 0542  WBC 12.2* 11.5* 10.0  HGB 12.8 11.1* 11.1*  HCT 40.0 34.7* 34.9*  PLT 444* 340 367   BMET Recent Labs    05/18/22 0444 05/19/22 0530 05/20/22 0542  NA 138 140 135  K 2.9* 3.0* 3.0*  CL 102 103 103  CO2 '25 24 24  '$ GLUCOSE 158* 122* 122*  BUN 8 <5* <5*  CREATININE 0.75 0.73 0.62  CALCIUM 8.2* 8.0* 7.7*   LFT Recent Labs    05/20/22 0542  PROT 5.9*  ALBUMIN 3.2*  AST 31  ALT 42  ALKPHOS 84  BILITOT 0.7   PT/INR No results for input(s): "LABPROT", "INR" in the last 72 hours.   Studies/Results: CT HEAD WO CONTRAST (5MM)  Result Date: 05/18/2022 CLINICAL DATA:  Vertigo.  Headache.  Syncope/presyncope. EXAM: CT HEAD WITHOUT CONTRAST TECHNIQUE: Contiguous axial images were obtained from the base of the skull through the vertex without intravenous contrast. RADIATION DOSE REDUCTION: This exam was performed according to the departmental  dose-optimization program which includes automated exposure control, adjustment of the mA and/or kV according to patient size and/or use of iterative reconstruction technique. COMPARISON:  None Available. FINDINGS: Brain: The brain shows a normal appearance without evidence of malformation, atrophy, old or acute small or large vessel infarction, mass lesion, hemorrhage, hydrocephalus or extra-axial collection. Vascular: No hyperdense vessel. No evidence of atherosclerotic calcification. Skull: Normal.  No traumatic finding.  No focal bone lesion. Sinuses/Orbits: Sinuses are clear. Orbits appear normal. Mastoids are clear. Other: None significant IMPRESSION: Normal head CT. Electronically Signed   By: Nelson Chimes M.D.   On: 05/18/2022 14:59    Impression/Plan: -Recurrent nausea and vomiting in a patient with history of gastroparesis as well as cannabinoid use.  Last use of marijuana around 3 months ago according to patient.  Has tried Reglan and erythromycin in the past without any significant improvement.  Currently followed by [facility] for further management. -Chronic abdominal pain -History of bipolar disorder and PTSD  Recommendations ------------------------ -Check HIDA scan with EF for recurrent nausea and vomiting -Continue other supportive care. -Patient was advised to reach out to [facility] to arrange for early follow-up -Slowly advance diet as tolerated -Minimize medications that can delay gastric emptying such as narcotics. -GI will follow     LOS: 3 days   Otis Brace  MD, FACP 05/20/2022, 9:49 AM  Contact #  [phone]

## 2022-05-21 ENCOUNTER — Inpatient Hospital Stay (HOSPITAL_COMMUNITY): Payer: BC Managed Care – PPO

## 2022-05-21 LAB — BASIC METABOLIC PANEL
Anion gap: 9 (ref 5–15)
BUN: <5 mg/dL — ABNORMAL LOW (ref 6–20)
CO2: 25 mmol/L (ref 22–32)
Calcium: 8.4 mg/dL — ABNORMAL LOW (ref 8.9–10.3)
Chloride: 102 mmol/L (ref 98–111)
Creatinine, Ser: 0.73 mg/dL (ref 0.44–1.00)
GFR, Estimated: >60 mL/min (ref >60)
Glucose, Bld: 104 mg/dL — ABNORMAL HIGH (ref 70–99)
Potassium: 3.5 mmol/L (ref 3.5–5.1)
Sodium: 136 mmol/L (ref 135–145)

## 2022-05-21 LAB — HEMOGLOBIN A1C
Hgb A1c MFr Bld: 6.6 % — ABNORMAL HIGH (ref 4.8–5.6)
Mean Plasma Glucose: 143 mg/dL

## 2022-05-21 LAB — PHOSPHORUS: Phosphorus: 3.5 mg/dL (ref 2.5–4.6)

## 2022-05-21 MED ORDER — PANTOPRAZOLE SODIUM 40 MG PO TBEC
40.0000 mg | DELAYED_RELEASE_TABLET | Freq: Two times a day (BID) | ORAL | Status: DC
  Administered 2022-05-21 – 2022-05-22 (×3): 40 mg via ORAL
  Filled 2022-05-21 (×3): qty 1

## 2022-05-21 MED ORDER — TECHNETIUM TC 99M MEBROFENIN IV KIT
5.0000 | PACK | Freq: Once | INTRAVENOUS | Status: AC
  Administered 2022-05-21: 5 via INTRAVENOUS

## 2022-05-21 NOTE — Progress Notes (Signed)
[facility] Progress Note  Deborah Kelly 32 y.o. [birth year]  CC: Recurrent nausea and vomiting, history of gastroparesis   Subjective: Patient seen and examined at bedside in radiology unit.  She denies any vomiting but continues to have nausea.  Continues to have generalized abdominal discomfort.  ROS : Afebrile, negative for chest pain   Objective: Vital signs in last 24 hours: Vitals:   05/20/22 2026 05/21/22 0338  BP: (!) 140/85 125/77  Pulse: 97 89  Resp: 16 16  Temp: 98 F (36.7 C) 97.9 F (36.6 C)  SpO2: 96% 97%    Physical Exam:  General: Obese patient, resting comfortably, not in acute distress Lungs: No visible respiratory distress Heart:  Regular rate and rhythm; no murmurs, clicks, rubs,  or gallops. Abdomen: Difficult examination because of body habitus.  Abdomen is soft, nontender, bowel sounds present, peritoneal signs Alert and oriented x 3 Mood and affect normal   Lab Results: Recent Labs    05/19/22 0530 05/20/22 0542 05/21/22 0537  NA 140 135 136  K 3.0* 3.0* 3.5  CL 103 103 102  CO2 '24 24 25  '$ GLUCOSE 122* 122* 104*  BUN <5* <5* <5*  CREATININE 0.73 0.62 0.73  CALCIUM 8.0* 7.7* 8.4*  MG 1.9 1.8  --   PHOS 1.8* 2.6 3.5   Recent Labs    05/19/22 0530 05/20/22 0542  AST 39 31  ALT 40 42  ALKPHOS 80 84  BILITOT 0.4 0.7  PROT 6.2* 5.9*  ALBUMIN 3.5 3.2*   Recent Labs    05/19/22 0530 05/20/22 0542  WBC 11.5* 10.0  NEUTROABS 5.0 4.3  HGB 11.1* 11.1*  HCT 34.7* 34.9*  MCV 87.8 86.8  PLT 340 367   No results for input(s): "LABPROT", "INR" in the last 72 hours.    Assessment/Plan: -Recurrent nausea and vomiting in a patient with history of gastroparesis as well as cannabinoid use.  Last use of marijuana around 3 months ago according to patient.  Has tried Reglan and erythromycin in the past without any significant improvement.  Currently followed by [facility] for further management. -Chronic abdominal pain -History of  bipolar disorder and PTSD   Recommendations ------------------------ -HIDA scan showed normal ejection fraction but delayed visualization of the gallbladder activity as well as pain with ingestion of Ensure could be related to chronic cholecystitis. -I will reach out to surgical team for formal surgery consult. -Meanwhile recommend to follow-up with [place] [facility] for management of refractory gastroparesis.  No further inpatient GI workup planned.  GI will sign off.  Call us back if needed.   Otis Brace MD, [place] 05/21/2022, 11:33 AM  Contact #  [phone]

## 2022-05-21 NOTE — Progress Notes (Addendum)
PROGRESS NOTE   Deborah Kelly  [id number] DOB: [birth year] DOA: 05/17/2022 PCP: Pcp, No  Brief Narrative:  32 year old female Cannabis abuse with gastroparesis Bipolar 1 disorder Hypothyroidism secondary to Graves' Chronic pelvic pain on gabapentin Second opinion 03/27/2022 [facility]-underwent abdominal surgery for PID and developed N/V with workup revealing gastric emptying prior therapy included Reglan and erythromycin and on recent admission had to use Ativan amitriptyline and P--gastric emptying study 07/2021 on opioids 33% emptying at 4 hours EGD showed grade C esophagitis Prucalopride  Came to [facility] ED 3/10 with vomiting stomach cramping and heart fluttering Potassium 2.6 In ED Rx Haldol Phenergan with mild relief EKG no change Admitted because of difficulty keeping anything down--started on Reglan and promethazine   Hospital-Problem based course  Intractable nausea vomiting in setting of idiopathic gastroparesis?  Con-commitment hyperemesis cannabinoid syndrome -HIDA scan ordered by GI seems negative per patient report but awaiting formal scan - Continue Compazine orally - Patient still has not ambulated out of bed and will need to move around before we can discharge her home  Moderate to severe hypokalemia and hypophosphatemia - D5 changed to 40 of K at 100 cc/H-continue replacement until tomorrow morning - Phosphorus replaced, is appropriate  Hypothyroidism with Graves' disease -Not on any replacement  Bipolar 1 depression history of suicide -Continue clonazepam 1 mg twice daily, Haldol 10 twice daily, Lamictal 200 daily  Chronic pain syndrome with chronic pelvic pain on gabapentin - Continue gabapentin 300 3 times daily  HTN -Chlorthalidone held (Tenoretic) - Blood pressures are relatively normal so we will just observe  Leukocytosis -Resolved on its own - Monitor trends periodically  Super morbid obesity BMI 57 A1c 6.6--diabetic - Resume metformin 500   DVT  prophylaxis: Lovenox Code Status: Full Family Communication: None present at this time Disposition:  Status is: Inpatient Remains inpatient appropriate because:   Has not tolerated oral diet as yet not ready for discharge    Subjective:  Overall seems well just back from procedure Has not eaten today No fever no chills  Objective: Vitals:   05/20/22 1345 05/20/22 2026 05/21/22 0338 05/21/22 1413  BP: 129/85 (!) 140/85 125/77 128/76  Pulse: 81 97 89 100  Resp:  16 16 20   Temp: 98.5 F (36.9 C) 98 F (36.7 C) 97.9 F (36.6 C) 98.3 F (36.8 C)  TempSrc: Oral Oral Oral   SpO2: 98% 96% 97% 99%  Weight:      Height:        Intake/Output Summary (Last 24 hours) at 05/21/2022 1458 Last data filed at 05/21/2022 0600 Gross per 24 hour  Intake 2854.05 ml  Output --  Net 2854.05 ml    Filed Weights   05/17/22 2038  Weight: (!) 172.5 kg    Examination:  Flat affect no distress Thick neck Mallampati 4 Chest is clear no wheeze rales rhonchi Abdomen obese but soft no rebound Neuro intact  Data Reviewed: personally reviewed   CBC    Component Value Date/Time   WBC 10.0 05/20/2022 0542   RBC 4.02 05/20/2022 0542   HGB 11.1 (L) 05/20/2022 0542   HCT 34.9 (L) 05/20/2022 0542   PLT 367 05/20/2022 0542   MCV 86.8 05/20/2022 0542   MCH 27.6 05/20/2022 0542   MCHC 31.8 05/20/2022 0542   RDW 14.1 05/20/2022 0542   LYMPHSABS 4.2 (H) 05/20/2022 0542   MONOABS 0.7 05/20/2022 0542   EOSABS 0.7 (H) 05/20/2022 0542   BASOSABS 0.1 05/20/2022 0542      Latest Ref  Rng & Units 05/21/2022    5:37 AM 05/20/2022    5:42 AM 05/19/2022    5:30 AM  CMP  Glucose 70 - 99 mg/dL 104  122  122   BUN 6 - 20 mg/dL <5  <5  <5   Creatinine 0.44 - 1.00 mg/dL 0.73  0.62  0.73   Sodium 135 - 145 mmol/L 136  135  140   Potassium 3.5 - 5.1 mmol/L 3.5  3.0  3.0   Chloride 98 - 111 mmol/L 102  103  103   CO2 22 - 32 mmol/L 25  24  24    Calcium 8.9 - 10.3 mg/dL 8.4  7.7  8.0   Total Protein  6.5 - 8.1 g/dL  5.9  6.2   Total Bilirubin 0.3 - 1.2 mg/dL  0.7  0.4   Alkaline Phos 38 - 126 U/L  84  80   AST 15 - 41 U/L  31  39   ALT 0 - 44 U/L  42  40      Radiology Studies: NM Hepato W/EF  Result Date: 05/21/2022 CLINICAL DATA:  Right upper quadrant abdominal pain, biliary disease suspected. EXAM: NUCLEAR MEDICINE HEPATOBILIARY IMAGING WITH GALLBLADDER EF TECHNIQUE: Sequential images of the abdomen were obtained out to 60 minutes following intravenous administration of radiopharmaceutical. After oral ingestion of Ensure, gallbladder ejection fraction was determined. At 60 min, normal ejection fraction is greater than 33%. RADIOPHARMACEUTICALS:  5.0 mCi Tc-65m  Choletec IV COMPARISON:  CT Jul 25, 2021 FINDINGS: Prompt uptake and biliary excretion of activity by the liver is seen. Delayed visualization of gallbladder activity at 70 minutes. Biliary activity passes into small bowel, consistent with patent common bile duct. Calculated gallbladder ejection fraction is 50%. (Normal gallbladder ejection fraction with Ensure is greater than 33%.) Patient endorses abdominal pain upon ingestion of Ensure. IMPRESSION: 1.  Patent cystic and common bile ducts. 2.  Normal gallbladder ejection fraction. 3. Delayed visualization of gallbladder activity and endorsement of abdominal pain post ingestion of Ensure by patient is nonspecific and of indeterminate clinical significance but may reflect chronic cholecystitis with relatively preserved ejection fraction. Electronically Signed   By: Dahlia Bailiff M.D.   On: 05/21/2022 14:42     Scheduled Meds:  bisacodyl  10 mg Oral Daily   clonazePAM  1 mg Oral BID   enoxaparin (LOVENOX) injection  80 mg Subcutaneous Q24H   gabapentin  300 mg Oral TID   haloperidol  10 mg Oral BID   lamoTRIgine  200 mg Oral QHS   metFORMIN  500 mg Oral Q breakfast   metoCLOPramide (REGLAN) injection  10 mg Intravenous Q6H   pantoprazole  40 mg Oral BID   Continuous  Infusions:  dextrose 5 % 1,000 mL with potassium chloride 40 mEq infusion 100 mL/hr at 05/21/22 0600     LOS: 4 days   Time spent: [place], MD [facility] To contact the attending provider between 7A-7P or the covering provider during after hours 7P-7A, please log into the web site [email or web address] and access using universal [place] password for that web site. If you do not have the password, please call the hospital operator.  05/21/2022, 2:58 PM

## 2022-05-21 NOTE — Progress Notes (Signed)
Patient stated that everything that she ate for dinner came back up.

## 2022-05-21 NOTE — Plan of Care (Signed)
Education provided

## 2022-05-21 NOTE — Progress Notes (Signed)
PHARMACIST - PHYSICIAN COMMUNICATION  DR:   Verlon Au  CONCERNING: IV to Oral Route Change Policy  RECOMMENDATION: This patient is receiving protonix by the intravenous route.  Based on criteria approved by the Pharmacy and Therapeutics Committee, the intravenous medication(s) is/are being converted to the equivalent oral dose form(s).   DESCRIPTION: These criteria include: The patient is eating (either orally or via tube) and/or has been taking other orally administered medications for a least 24 hours The patient has no evidence of active gastrointestinal bleeding or impaired GI absorption (gastrectomy, short bowel, patient on TNA or NPO).  If you have questions about this conversion, please contact the Pharmacy Department  '[]'$   [phone] )  [facility] '[]'$   [phone] )  [facility] '[]'$   [phone] )  [facility] '[]'$   [phone] )  [facility] '[x]'$   [phone] )  [place], PharmD, [place]: [phone] 05/21/2022 7:42 AM

## 2022-05-21 NOTE — Consult Note (Incomplete)
Consult Note  Deborah Kelly Mar 15, 1990  [id number].    Requesting MD: Dr. Alessandra Bevels Chief Complaint/Reason for Consult: possible chronic cholecystitis  HPI:  32 y.o. female with medical history significant for *** who presented to *** with ***.  Substance use: *** Allergies: *** Blood thinners: *** Past Surgeries: ***  ***Denies a history of MI, CVA, asthma, or COPD. At baseline states he mobilizes without an assistive device and can climb a flight of stairs without stopping due to DOE or chest pain.  ROS: ROS  History reviewed. No pertinent family history.  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 No pertinent surgical history.  Social History:  reports that she has quit smoking. Her smoking use included cigarettes. She has never used smokeless tobacco. She reports that she does not currently use alcohol. She reports current drug use. Drug: Marijuana.  Allergies:  Allergies  Allergen Reactions   Penicillins Swelling and Rash    Throat swells   Amlodipine Hives   Dicyclomine Rash and Other (See Comments)    Pt states it makes her hands shake;     Medications Prior to Admission  Medication Sig Dispense Refill   atenolol-chlorthalidone (TENORETIC) 100-25 MG tablet Take 1 tablet by mouth at bedtime.     clonazePAM (KLONOPIN) 1 MG tablet Take 1 mg by mouth 2 (two) times daily.     FLUoxetine (PROZAC) 20 MG capsule Take 20 mg by mouth daily.     gabapentin (NEURONTIN) 300 MG capsule Take 300 mg by mouth 3 (three) times daily.     haloperidol (HALDOL)  10 MG tablet Take 10 mg by mouth 2 (two) times daily.     lamoTRIgine (LAMICTAL) 200 MG tablet Take 1 tablet (200 mg total) by mouth daily. (Patient taking differently: Take 200 mg by mouth at bedtime.) 30 tablet 0   metFORMIN (GLUCOPHAGE-XR) 500 MG 24 hr tablet Take 500 mg by mouth daily.     ondansetron (ZOFRAN-ODT) 4 MG disintegrating tablet Take 4 mg by mouth every 8 (eight) hours as needed for vomiting or nausea.     promethazine (PHENERGAN) 12.5 MG tablet Take 2 tablets (25 mg total) by mouth every 6 (six) hours as needed for nausea or vomiting. 30 tablet 0   acetaminophen-codeine (TYLENOL #4) 300-60 MG tablet Take 1 tablet by mouth every 8 (eight) hours as needed. (Patient not taking: Reported on 05/17/2022) 15 tablet 0   bisacodyl (DULCOLAX) 5 MG EC tablet Take 2 tablets (10 mg total) by mouth daily. (Patient not taking: Reported on 05/17/2022) 30 tablet 0   erythromycin (E-MYCIN) 250 MG tablet Take 1 tablet (250 mg total) by mouth 3 (three) times daily before meals. (Patient not taking: Reported on 09/02/2021) 90 tablet 2   metoCLOPramide (REGLAN) 10 MG tablet Take 1 tablet (10 mg total) by mouth 3 (three) times daily with meals. (Patient not taking: Reported on 05/17/2022) 30 tablet 1   pantoprazole (PROTONIX) 40 MG tablet Take 1 tablet (40 mg total) by mouth daily. (Patient not taking:  Reported on 07/15/2021) 30 tablet 1   phenol (CHLORASEPTIC) 1.4 % LIQD Use as directed 1 spray in the mouth or throat as needed for throat irritation / pain. (Patient not taking: Reported on 05/17/2022) 20 mL 0   polyethylene glycol (MIRALAX / GLYCOLAX) 17 g packet Take 17 g by mouth daily. (Patient not taking: Reported on 09/02/2021) 14 each 0    Blood pressure 128/76, pulse 100, temperature 98.3 F (36.8 C), resp. rate 20, height '5\' 8"'$  (1.727 m), weight (!) 172.5 kg, last menstrual period 05/21/2022, SpO2 99 %. Physical Exam:*** General: pleasant, WD, *** female/female who is laying in bed in NAD*** HEENT: head is  normocephalic, atraumatic.  Sclera are noninjected.  Pupils equal and round. EOMs intact.  Ears and nose without any masses or lesions.  Mouth is pink and moist Heart: regular, rate, and rhythm.  Normal s1,s2. No obvious murmurs, gallops, or rubs noted.  Palpable radial and pedal pulses bilaterally Lungs: CTAB, no wheezes, rhonchi, or rales noted.  Respiratory effort nonlabored Abd: ***soft, NT, ND, +BS, no masses, hernias, or organomegaly MSK: all 4 extremities are symmetrical with no cyanosis, clubbing, or edema. Skin: warm and dry with no masses, lesions, or rashes Neuro: Cranial nerves 2-12 grossly intact, sensation is normal throughout Psych: A&Ox3 with an appropriate affect.    Results for orders placed or performed during the hospital encounter of 05/17/22 (from the past 48 hour(s))  Hemoglobin A1c     Status: Abnormal   Collection Time: 05/20/22  5:42 AM  Result Value Ref Range   Hgb A1c MFr Bld 6.6 (H) 4.8 - 5.6 %    Comment: (NOTE)         Prediabetes: 5.7 - 6.4         Diabetes: >6.4         Glycemic control for adults with diabetes: <7.0    Mean Plasma Glucose 143 mg/dL    Comment: (NOTE) Performed At: [facility] [place], [place] [id number] Rush Farmer MD [id number]   CBC with Differential/Platelet     Status: Abnormal   Collection Time: 05/20/22  5:42 AM  Result Value Ref Range   WBC 10.0 4.0 - 10.5 K/uL   RBC 4.02 3.87 - 5.11 MIL/uL   Hemoglobin 11.1 (L) 12.0 - 15.0 g/dL   HCT 34.9 (L) 36.0 - 46.0 %   MCV 86.8 80.0 - 100.0 fL   MCH 27.6 26.0 - 34.0 pg   MCHC 31.8 30.0 - 36.0 g/dL   RDW 14.1 11.5 - 15.5 %   Platelets 367 150 - 400 K/uL   nRBC 0.0 0.0 - 0.2 %   Neutrophils Relative % 42 %   Neutro Abs 4.3 1.7 - 7.7 K/uL   Lymphocytes Relative 42 %   Lymphs Abs 4.2 (H) 0.7 - 4.0 K/uL   Monocytes Relative 7 %   Monocytes Absolute 0.7 0.1 - 1.0 K/uL   Eosinophils Relative 7 %   Eosinophils Absolute 0.7 (H) 0.0 - 0.5 K/uL    Basophils Relative 1 %   Basophils Absolute 0.1 0.0 - 0.1 K/uL   Immature Granulocytes 1 %   Abs Immature Granulocytes 0.06 0.00 - 0.07 K/uL    Comment: Performed at [facility], [place] [address], [place], [place] [address]  Comprehensive metabolic panel     Status: Abnormal   Collection Time: 05/20/22  5:42 AM  Result Value Ref Range   Sodium 135 135 - 145 mmol/L   Potassium  3.0 (L) 3.5 - 5.1 mmol/L   Chloride 103 98 - 111 mmol/L   CO2 24 22 - 32 mmol/L   Glucose, Bld 122 (H) 70 - 99 mg/dL    Comment: Glucose reference range applies only to samples taken after fasting for at least 8 hours.   BUN <5 (L) 6 - 20 mg/dL   Creatinine, Ser 0.62 0.44 - 1.00 mg/dL   Calcium 7.7 (L) 8.9 - 10.3 mg/dL   Total Protein 5.9 (L) 6.5 - 8.1 g/dL   Albumin 3.2 (L) 3.5 - 5.0 g/dL   AST 31 15 - 41 U/L   ALT 42 0 - 44 U/L   Alkaline Phosphatase 84 38 - 126 U/L   Total Bilirubin 0.7 0.3 - 1.2 mg/dL   GFR, Estimated >60 >60 mL/min    Comment: (NOTE) Calculated using the CKD-EPI Creatinine Equation (2021)    Anion gap 8 5 - 15    Comment: Performed at [facility], [place] [address], [place], [place] [address]  Magnesium     Status: None   Collection Time: 05/20/22  5:42 AM  Result Value Ref Range   Magnesium 1.8 1.7 - 2.4 mg/dL    Comment: Performed at [facility], [place] [address], [place], [place] [address]  Phosphorus     Status: None   Collection Time: 05/20/22  5:42 AM  Result Value Ref Range   Phosphorus 2.6 2.5 - 4.6 mg/dL    Comment: Performed at [facility], [place] [address] [place], [place] [address]  Phosphorus     Status: None   Collection Time: 05/21/22  5:37 AM  Result Value Ref Range   Phosphorus 3.5 2.5 - 4.6 mg/dL    Comment: Performed at [facility], [place] [address], [place], [place] [id number]  Basic metabolic panel     Status: Abnormal   Collection Time: 05/21/22  5:37 AM  Result Value  Ref Range   Sodium 136 135 - 145 mmol/L   Potassium 3.5 3.5 - 5.1 mmol/L   Chloride 102 98 - 111 mmol/L   CO2 25 22 - 32 mmol/L   Glucose, Bld 104 (H) 70 - 99 mg/dL    Comment: Glucose reference range applies only to samples taken after fasting for at least 8 hours.   BUN <5 (L) 6 - 20 mg/dL   Creatinine, Ser 0.73 0.44 - 1.00 mg/dL   Calcium 8.4 (L) 8.9 - 10.3 mg/dL   GFR, Estimated >60 >60 mL/min    Comment: (NOTE) Calculated using the CKD-EPI Creatinine Equation (2021)    Anion gap 9 5 - 15    Comment: Performed at [facility], [place] [address] [place], [place] [address]   NM Hepato W/EF  Result Date: 05/21/2022 CLINICAL DATA:  Right upper quadrant abdominal pain, biliary disease suspected. EXAM: NUCLEAR MEDICINE HEPATOBILIARY IMAGING WITH GALLBLADDER EF TECHNIQUE: Sequential images of the abdomen were obtained out to 60 minutes following intravenous administration of radiopharmaceutical. After oral ingestion of Ensure, gallbladder ejection fraction was determined. At 60 min, normal ejection fraction is greater than 33%. RADIOPHARMACEUTICALS:  5.0 mCi Tc-76m Choletec IV COMPARISON:  CT Jul 25, 2021 FINDINGS: Prompt uptake and biliary excretion of activity by the liver is seen. Delayed visualization of gallbladder activity at 70 minutes. Biliary activity passes into small bowel, consistent with patent common bile duct. Calculated gallbladder ejection fraction is 50%. (Normal gallbladder ejection fraction with Ensure is greater than 33%.) Patient  endorses abdominal pain upon ingestion of Ensure. IMPRESSION: 1.  Patent cystic and common bile ducts. 2.  Normal gallbladder ejection fraction. 3. Delayed visualization of gallbladder activity and endorsement of abdominal pain post ingestion of Ensure by patient is nonspecific and of indeterminate clinical significance but may reflect chronic cholecystitis with relatively preserved ejection fraction. Electronically Signed   By:  Dahlia Bailiff M.D.   On: 05/21/2022 14:42      Assessment/Plan ***   FEN: *** ID: *** VTE: ***  Dispo: ***  I reviewed {Reviewed data:26882::"last 24 h vitals and pain scores","last 48 h intake and output","last 24 h labs and trends","last 24 h imaging results"}.  This care required {MDM levels:26883} level of medical decision making.   Winferd Humphrey, [facility] 05/21/2022, 4:17 PM Please see Amion for pager number during day hours 7:00am-4:30pm

## 2022-05-22 LAB — BASIC METABOLIC PANEL
Anion gap: 10 (ref 5–15)
BUN: 6 mg/dL (ref 6–20)
CO2: 24 mmol/L (ref 22–32)
Calcium: 8.5 mg/dL — ABNORMAL LOW (ref 8.9–10.3)
Chloride: 103 mmol/L (ref 98–111)
Creatinine, Ser: 0.73 mg/dL (ref 0.44–1.00)
GFR, Estimated: >60 mL/min (ref >60)
Glucose, Bld: 102 mg/dL — ABNORMAL HIGH (ref 70–99)
Potassium: 3.6 mmol/L (ref 3.5–5.1)
Sodium: 137 mmol/L (ref 135–145)

## 2022-05-22 MED ORDER — POTASSIUM CHLORIDE IN NACL 40-0.9 MEQ/L-% IV SOLN
INTRAVENOUS | Status: DC
  Filled 2022-05-22: qty 1000

## 2022-05-22 MED ORDER — POTASSIUM CHLORIDE CRYS ER 20 MEQ PO TBCR: 20.0000 meq | EXTENDED_RELEASE_TABLET | Freq: Two times a day (BID) | ORAL | 0 refills | Status: DC

## 2022-05-22 MED ORDER — PROCHLORPERAZINE MALEATE 5 MG PO TABS: 5.0000 mg | ORAL_TABLET | Freq: Four times a day (QID) | ORAL | 0 refills | Status: DC | PRN

## 2022-05-22 NOTE — Progress Notes (Addendum)
[facility] Progress Note  Deborah Kelly 32 y.o. 1990/11/14  CC: Recurrent nausea and vomiting, history of gastroparesis   Subjective: Patient seen and examined at bedside.  She had some vomiting last night but feeling better today.  ROS : Afebrile, negative for chest pain   Objective: Vital signs in last 24 hours: Vitals:   05/21/22 1413 05/21/22 2154  BP: 128/76 (!) 146/83  Pulse: 100 97  Resp: 20 20  Temp: 98.3 F (36.8 C) 98.5 F (36.9 C)  SpO2: 99% 98%    Physical Exam:  General: Obese patient, resting comfortably, not in acute distress Lungs: No visible respiratory distress Heart:  Regular rate and rhythm; no murmurs, clicks, rubs,  or gallops. Abdomen: Difficult examination because of body habitus.  Abdomen is soft, nontender, bowel sounds present, peritoneal signs Alert and oriented x 3 Mood and affect normal   Lab Results: Recent Labs    05/20/22 0542 05/21/22 0537 05/22/22 0640  NA 135 136 137  K 3.0* 3.5 3.6  CL 103 102 103  CO2 24 25 24   GLUCOSE 122* 104* 102*  BUN <5* <5* 6  CREATININE 0.62 0.73 0.73  CALCIUM 7.7* 8.4* 8.5*  MG 1.8  --   --   PHOS 2.6 3.5  --    Recent Labs    05/20/22 0542  AST 31  ALT 42  ALKPHOS 84  BILITOT 0.7  PROT 5.9*  ALBUMIN 3.2*   Recent Labs    05/20/22 0542  WBC 10.0  NEUTROABS 4.3  HGB 11.1*  HCT 34.9*  MCV 86.8  PLT 367   No results for input(s): "LABPROT", "INR" in the last 72 hours.    Assessment/Plan: -Recurrent nausea and vomiting in a patient with history of gastroparesis as well as cannabinoid use.  Last use of marijuana around 3 months ago according to patient.  Has tried Reglan and erythromycin in the past without any significant improvement.  Currently followed by [facility] for further management. -Chronic abdominal pain -History of bipolar disorder and PTSD -abnormal HIDA scan showing delayed gallbladder uptake but but normal EF and patent cystic duct.  Appreciate surgery  input.   Recommendations ------------------------ -Patient feeling somewhat better today.  Appreciate surgery input.  No further inpatient GI workup planned.  Follow-up with [facility] as scheduled.  GI will sign off.  Call us back if needed.   Otis Brace MD, [place] 05/22/2022, 10:50 AM  Contact #  [phone]

## 2022-05-22 NOTE — Discharge Summary (Signed)
Physician Discharge Summary  Deborah Kelly [id number] DOB: [birth year] DOA: 05/17/2022  PCP: Pcp, No  Admit date: 05/17/2022 Discharge date: 05/22/2022  Time spent: 47 minutes  Recommendations for Outpatient Follow-up:  Patient requires subspecialty gastroenterology management at [facility] if she starts having intractable nausea vomiting secondary to her underlying gastroparesis-please bear this in mind if patient should present to [facility] health facility Needs Chem-12 CBC in about a week Recommend consideration for outpatient discussion regarding interval cholecystectomy as per recommendation of general surgery who will reassess her if she presents back to them locally Her superobesity should be addressed by outpatient PCP with regards to strategies to alleviate this Does need thyroid issues sorted out in the outpatient setting  Discharge Diagnoses:  MAIN problem for hospitalization   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  Not on thyroxine or any treatment? Bipolar 1  Please see below for itemized issues addressed in HOpsital- refer to other progress notes for clarity if needed  Discharge Condition: Improved  Diet recommendation: Regular but soft diet  Filed Weights   05/17/22 2038  Weight: (!) 172.5 kg    History of present illness:  32 year old female Cannabis abuse with gastroparesis Bipolar 1 disorder Hypothyroidism secondary to Graves' Chronic pelvic pain on gabapentin Second opinion 03/27/2022 [facility]-underwent abdominal surgery for PID and developed N/V with workup revealing gastric emptying prior therapy included Reglan and erythromycin and on recent admission had to use Ativan amitriptyline and P--gastric emptying study 07/2021 on opioids 33% emptying at 4 hours EGD showed grade C esophagitis Prucalopride   Came to [facility] ED 3/10 with vomiting stomach cramping and heart  fluttering Potassium 2.6 In ED Rx Haldol Phenergan with mild relief EKG no change Admitted because of difficulty keeping anything down--started on Reglan and promethazine  Hospital Course:  Intractable nausea vomiting in setting of idiopathic gastroparesis?  Con-commitment hyperemesis cannabinoid syndrome -HIDA scan ordered by GI was equivocal for chronic cholecystitis, general surgery consulted on 3/15 and did not think that the patient would benefit from interval cholecystectomy and recommended outpatient follow-up as symptoms are much improved - Continue Compazine orally at time of discharge as she has a preference for this and this seems to work   Moderate to severe hypokalemia and hypophosphatemia - Patient was placed on D5 with K and replacement was given to the patient and patient was given K-Lor on discharge limited amount - Phosphorus replaced, is appropriate   Hypothyroidism with Graves' disease -Not on any replacement   Bipolar 1 depression history of suicide -Continue clonazepam 1 mg twice daily, Haldol 10 twice daily, Lamictal 200 daily - Needs to follow-up with regular physician to ensure that can get refills on discharge   Chronic pain syndrome with chronic pelvic pain on gabapentin - Continue gabapentin 300 3 times daily   HTN -Chlorthalidone held (Tenoretic) - Blood pressures are relatively normal so we will just observe and this may need to be reinitiated as an outpatient - In retrospect I think that hypokalemia and hypophosphatemia may be secondary to the chlorthalidone   Leukocytosis -Resolved on its own - Monitor trends periodically   Super morbid obesity BMI 57 A1c 6.6--diabetic - Resume metformin 500 on discharge   Discharge Exam: Vitals:   05/21/22 1413 05/21/22 2154  BP: 128/76 (!) 146/83  Pulse: 100 97  Resp: 20 20  Temp: 98.3 F (36.8 C) 98.5 F (36.9 C)  SpO2: 99% 98%    Subj on day of  d/c   Awake coherent no distress seems comfortable  has eaten several meals is ready to go home  General Exam on discharge  EOMI I NCAT no focal deficit no icterus no pallor no wheeze no rales no rhonchi Abdomen is soft slightly tender in epigastrium no rebound however-further assessment of organomegaly is impossible given patient's habitus No lower extremity edema ROM intact Multiple tattoos  Discharge Instructions   Discharge Instructions     Diet - low sodium heart healthy   Complete by: As directed    Discharge instructions   Complete by: As directed    Please make sure that you eat soft and palatable foods and at the first sign of nausea/vomiting I would recommend that you present yourself to [facility]/emergency room as they have the specialty to deal with your specific type of gastroparesis-although we would love to take care of you, we do not have the expertise necessary to treat your intractable gastroparesis You will notice that we have prescribed a small amount of potassium for you as you were slightly low on your potassium during hospital stay and I would recommend that you get your blood work checked in about 1 week in the outpatient setting It is integral to your specific situation that you absolutely cease and desist from smoking cannabis as I think that that is a trigger that causes you to have hyperemesis (vomiting) Please continue to obtain your chronic medications including your antidepressants/pain meds from who normally prescribes them for you Best of luck and have a good summer   Increase activity slowly   Complete by: As directed       Allergies as of 05/22/2022       Reactions   Penicillins Swelling, Rash   Throat swells   Amlodipine Hives   Dicyclomine Rash, Other (See Comments)   Pt states it makes her hands shake;         Medication List     STOP taking these medications    acetaminophen-codeine 300-60 MG tablet Commonly known as: TYLENOL #4   atenolol-chlorthalidone 100-25  MG tablet Commonly known as: TENORETIC   erythromycin 250 MG tablet Commonly known as: E-MYCIN   FLUoxetine 20 MG capsule Commonly known as: PROZAC   metoCLOPramide 10 MG tablet Commonly known as: REGLAN   phenol 1.4 % Liqd Commonly known as: CHLORASEPTIC       TAKE these medications    bisacodyl 5 MG EC tablet Commonly known as: DULCOLAX Take 2 tablets (10 mg total) by mouth daily.   clonazePAM 1 MG tablet Commonly known as: KLONOPIN Take 1 mg by mouth 2 (two) times daily.   gabapentin 300 MG capsule Commonly known as: NEURONTIN Take 300 mg by mouth 3 (three) times daily.   haloperidol 10 MG tablet Commonly known as: HALDOL Take 10 mg by mouth 2 (two) times daily.   lamoTRIgine 200 MG tablet Commonly known as: LAMICTAL Take 1 tablet (200 mg total) by mouth daily. What changed: when to take this   metFORMIN 500 MG 24 hr tablet Commonly known as: GLUCOPHAGE-XR Take 500 mg by mouth daily.   ondansetron 4 MG disintegrating tablet Commonly known as: ZOFRAN-ODT Take 4 mg by mouth every 8 (eight) hours as needed for vomiting or nausea.   pantoprazole 40 MG tablet Commonly known as: Protonix Take 1 tablet (40 mg total) by mouth daily.   polyethylene glycol 17 g packet Commonly known as: MIRALAX / GLYCOLAX Take 17 g by mouth daily.  potassium chloride SA 20 MEQ tablet Commonly known as: KLOR-CON M Take 1 tablet (20 mEq total) by mouth 2 (two) times daily for 3 days.   promethazine 12.5 MG tablet Commonly known as: PHENERGAN Take 2 tablets (25 mg total) by mouth every 6 (six) hours as needed for nausea or vomiting.       Allergies  Allergen Reactions   Penicillins Swelling and Rash    Throat swells   Amlodipine Hives   Dicyclomine Rash and Other (See Comments)    Pt states it makes her hands shake;     Follow-up Information     Surgery, [place]. Call.   Specialty: General Surgery Why: As needed if you would like to discuss elective  surgery to remove gallbladder in the future. Contact information: [address] [place] [address] [phone]                  The results of significant diagnostics from this hospitalization (including imaging, microbiology, ancillary and laboratory) are listed below for reference.    Significant Diagnostic Studies: NM Hepato W/EF  Result Date: 05/21/2022 CLINICAL DATA:  Right upper quadrant abdominal pain, biliary disease suspected. EXAM: NUCLEAR MEDICINE HEPATOBILIARY IMAGING WITH GALLBLADDER EF TECHNIQUE: Sequential images of the abdomen were obtained out to 60 minutes following intravenous administration of radiopharmaceutical. After oral ingestion of Ensure, gallbladder ejection fraction was determined. At 60 min, normal ejection fraction is greater than 33%. RADIOPHARMACEUTICALS:  5.0 mCi Tc-14m  Choletec IV COMPARISON:  CT Jul 25, 2021 FINDINGS: Prompt uptake and biliary excretion of activity by the liver is seen. Delayed visualization of gallbladder activity at 70 minutes. Biliary activity passes into small bowel, consistent with patent common bile duct. Calculated gallbladder ejection fraction is 50%. (Normal gallbladder ejection fraction with Ensure is greater than 33%.) Patient endorses abdominal pain upon ingestion of Ensure. IMPRESSION: 1.  Patent cystic and common bile ducts. 2.  Normal gallbladder ejection fraction. 3. Delayed visualization of gallbladder activity and endorsement of abdominal pain post ingestion of Ensure by patient is nonspecific and of indeterminate clinical significance but may reflect chronic cholecystitis with relatively preserved ejection fraction. Electronically Signed   By: Dahlia Bailiff M.D.   On: 05/21/2022 14:42   CT HEAD WO CONTRAST (5MM)  Result Date: 05/18/2022 CLINICAL DATA:  Vertigo.  Headache.  Syncope/presyncope. EXAM: CT HEAD WITHOUT CONTRAST TECHNIQUE: Contiguous axial images were obtained from the base of the skull  through the vertex without intravenous contrast. RADIATION DOSE REDUCTION: This exam was performed according to the departmental dose-optimization program which includes automated exposure control, adjustment of the mA and/or kV according to patient size and/or use of iterative reconstruction technique. COMPARISON:  None Available. FINDINGS: Brain: The brain shows a normal appearance without evidence of malformation, atrophy, old or acute small or large vessel infarction, mass lesion, hemorrhage, hydrocephalus or extra-axial collection. Vascular: No hyperdense vessel. No evidence of atherosclerotic calcification. Skull: Normal.  No traumatic finding.  No focal bone lesion. Sinuses/Orbits: Sinuses are clear. Orbits appear normal. Mastoids are clear. Other: None significant IMPRESSION: Normal head CT. Electronically Signed   By: Nelson Chimes M.D.   On: 05/18/2022 14:59    Microbiology: No results found for this or any previous visit (from the past 240 hour(s)).   Labs: Basic Metabolic Panel: Recent Labs  Lab 05/17/22 1430 05/17/22 2043 05/18/22 0444 05/19/22 0530 05/20/22 0542 05/21/22 0537 05/22/22 0640  NA 140  --  138 140 135 136 137  K  2.6*  --  2.9* 3.0* 3.0* 3.5 3.6  CL 100  --  102 103 103 102 103  CO2 27  --  25 24 24 25 24   GLUCOSE 126*  --  158* 122* 122* 104* 102*  BUN 10  --  8 <5* <5* <5* 6  CREATININE 0.80   < > 0.75 0.73 0.62 0.73 0.73  CALCIUM 8.6*  --  8.2* 8.0* 7.7* 8.4* 8.5*  MG 1.9  --  2.2 1.9 1.8  --   --   PHOS  --   --  1.8* 1.8* 2.6 3.5  --    < > = values in this interval not displayed.   Liver Function Tests: Recent Labs  Lab 05/17/22 1430 05/18/22 0444 05/19/22 0530 05/20/22 0542  AST 24 19 39 31  ALT 21 20 40 42  ALKPHOS 89 84 80 84  BILITOT 0.2* 0.4 0.4 0.7  PROT 6.9 7.0 6.2* 5.9*  ALBUMIN 4.3 4.1 3.5 3.2*   Recent Labs  Lab 05/17/22 1430  LIPASE 31   No results for input(s): "AMMONIA" in the last 168 hours. CBC: Recent Labs  Lab  05/17/22 1430 05/17/22 2043 05/18/22 0444 05/19/22 0530 05/20/22 0542  WBC 16.5* 15.2* 12.2* 11.5* 10.0  NEUTROABS 12.7*  --   --  5.0 4.3  HGB 12.7 12.1 12.8 11.1* 11.1*  HCT 38.9 37.0 40.0 34.7* 34.9*  MCV 85.1 85.6 87.3 87.8 86.8  PLT 452* 420* 444* 340 367   Cardiac Enzymes: No results for input(s): "CKTOTAL", "CKMB", "CKMBINDEX", "TROPONINI" in the last 168 hours. BNP: BNP (last 3 results) No results for input(s): "BNP" in the last 8760 hours.  ProBNP (last 3 results) No results for input(s): "PROBNP" in the last 8760 hours.  CBG: No results for input(s): "GLUCAP" in the last 168 hours.     Signed:  Nita Sells MD   [facility] 05/22/2022, 1:07 PM

## 2022-05-22 NOTE — Consult Note (Signed)
Consult Note  Deborah Kelly [birth year]  MQ:[id number].    Requesting MD: Otis Brace, MD Chief Complaint/Reason for Consult: abdominal pain with recurrent nausea and vomiting HPI:  Deborah Kelly is a 32 year old female who was admitted to [facility] hospital 3/10 with recurrent abdominal pain and nausea and vomiting. Deborah Kelly has known history of gastroparesis and recently established with [facility] in January for management. She has also previously seen [facility] in [place] as well. She has had recurrent issues with pain and nausea and vomiting for the last 8 years. She reports generally episodes start with pain that is periumbilical but feels like it is deep below that point and radiates outward across entire abdomen. Pain is burning and sharp in character. Nausea then sets in and vomiting. She sometimes vomits just what she has eaten and sometimes has bilious emesis after she has vomited several times. She has had extensive workup as an outpatient. She does not note any association with eating or PO intake and reports often when episodes start she will just wake up feeling sick one morning. She has tried erythromycin and reglan previously for motility and didn't notice much improvement. Her mother had a history of biliary colic and had to have her gallbladder removed but she has never been told that her symptoms are consistent with this and no gallstones are noted on any previous imaging. PMH otherwise significant for morbid obesity, Graves disease, Hx of PID with chronic pelvic pain, dysmenorrhea, Bipolar 1 disorder, Depression/Anxiety, Hx of alcohol abuse and Cannabinoid use. She previously underwent surgery for PID and is currently undergoing workup for possible endometriosis but does not note that symptoms seem related to menstrual cycles either. Recently hospitalized with suicidal ideation and psych meds were adjusted but she does not note change in symptoms with this. She reports she no longer  uses marijuana but uses delta 8 and CBD products - however she has previously abstained from cannabinoids and similar products for up to a year at a time and symptoms did not improve. She is understandably frustrated by recurrent symptoms but reports she would not want to undergo surgery if it was not felt that it would likely improve symptoms. At the time of her previous surgery for PID she woke up initially sicker than she was pre-operatively and she has a lot of anxiety and post-traumatic stress from this.   ROS: Negative other than HPI  History reviewed. No pertinent family history.  Past Medical History:  Diagnosis Date   Alcohol abuse 05/19/2017   Anxiety    Bipolar 1 disorder ([place])    Bipolar affective disorder, depressed ([place]) 01/20/2012   Cannabis misuse 03/15/2021   Chronic pelvic pain in female 07/08/2020   Depression    Dysmenorrhea 07/08/2020   Elevated prolactin level 03/15/2021   Gastroparesis    Graves disease    History of suicide attempt 05/18/2017   Hyperthyroidism    Intractable vomiting 04/27/2021    History reviewed. No pertinent surgical history.  Social History:  reports that she has quit smoking. Her smoking use included cigarettes. She has never used smokeless tobacco. She reports that she does not currently use alcohol. She reports current drug use. Drug: Marijuana.  Allergies:  Allergies  Allergen Reactions   Penicillins Swelling and Rash    Throat swells   Amlodipine Hives   Dicyclomine Rash and Other (See Comments)    Pt states it makes her hands shake;     Medications Prior  to Admission  Medication Sig Dispense Refill   atenolol-chlorthalidone (TENORETIC) 100-25 MG tablet Take 1 tablet by mouth at bedtime.     clonazePAM (KLONOPIN) 1 MG tablet Take 1 mg by mouth 2 (two) times daily.     FLUoxetine (PROZAC) 20 MG capsule Take 20 mg by mouth daily.     gabapentin (NEURONTIN) 300 MG capsule Take 300 mg by mouth 3 (three) times daily.     haloperidol  (HALDOL) 10 MG tablet Take 10 mg by mouth 2 (two) times daily.     lamoTRIgine (LAMICTAL) 200 MG tablet Take 1 tablet (200 mg total) by mouth daily. (Deborah Kelly taking differently: Take 200 mg by mouth at bedtime.) 30 tablet 0   metFORMIN (GLUCOPHAGE-XR) 500 MG 24 hr tablet Take 500 mg by mouth daily.     ondansetron (ZOFRAN-ODT) 4 MG disintegrating tablet Take 4 mg by mouth every 8 (eight) hours as needed for vomiting or nausea.     promethazine (PHENERGAN) 12.5 MG tablet Take 2 tablets (25 mg total) by mouth every 6 (six) hours as needed for nausea or vomiting. 30 tablet 0   acetaminophen-codeine (TYLENOL #4) 300-60 MG tablet Take 1 tablet by mouth every 8 (eight) hours as needed. (Deborah Kelly not taking: Reported on 05/17/2022) 15 tablet 0   bisacodyl (DULCOLAX) 5 MG EC tablet Take 2 tablets (10 mg total) by mouth daily. (Deborah Kelly not taking: Reported on 05/17/2022) 30 tablet 0   erythromycin (E-MYCIN) 250 MG tablet Take 1 tablet (250 mg total) by mouth 3 (three) times daily before meals. (Deborah Kelly not taking: Reported on 09/02/2021) 90 tablet 2   metoCLOPramide (REGLAN) 10 MG tablet Take 1 tablet (10 mg total) by mouth 3 (three) times daily with meals. (Deborah Kelly not taking: Reported on 05/17/2022) 30 tablet 1   pantoprazole (PROTONIX) 40 MG tablet Take 1 tablet (40 mg total) by mouth daily. (Deborah Kelly not taking: Reported on 07/15/2021) 30 tablet 1   phenol (CHLORASEPTIC) 1.4 % LIQD Use as directed 1 spray in the mouth or throat as needed for throat irritation / pain. (Deborah Kelly not taking: Reported on 05/17/2022) 20 mL 0   polyethylene glycol (MIRALAX / GLYCOLAX) 17 g packet Take 17 g by mouth daily. (Deborah Kelly not taking: Reported on 09/02/2021) 14 each 0    Blood pressure (!) 146/83, pulse 97, temperature 98.5 F (36.9 C), resp. rate 20, height 5\' 8"  (1.727 m), weight (!) 172.5 kg, last menstrual period 05/21/2022, SpO2 98 %. Physical Exam:  General: pleasant, WD female who is laying in bed in NAD HEENT: head is  normocephalic, atraumatic.  Sclera are anicteric. EOMI.  Ears and nose without any masses or lesions.  Mouth is pink and moist Heart: regular, rate, and rhythm.    Palpable radial pulses bilaterally Lungs: Respiratory effort nonlabored on room air Abd: soft, obese, ttp in periumbilical abdomen with no peritonitis, no RUQ ttp on my exam, ND, no masses, hernias, or organomegaly MS: all 4 extremities are symmetrical with no cyanosis, clubbing, or edema. Skin: warm and dry with no masses, lesions, or rashes Psych: A&Ox3 with an appropriate affect.   Results for orders placed or performed during the hospital encounter of 05/17/22 (from the past 48 hour(s))  Phosphorus     Status: None   Collection Time: 05/21/22  5:37 AM  Result Value Ref Range   Phosphorus 3.5 2.5 - 4.6 mg/dL    Comment: Performed at [facility], [place] [address], [place], [place] [id number]  Basic metabolic panel  Status: Abnormal   Collection Time: 05/21/22  5:37 AM  Result Value Ref Range   Sodium 136 135 - 145 mmol/L   Potassium 3.5 3.5 - 5.1 mmol/L   Chloride 102 98 - 111 mmol/L   CO2 25 22 - 32 mmol/L   Glucose, Bld 104 (H) 70 - 99 mg/dL    Comment: Glucose reference range applies only to samples taken after fasting for at least 8 hours.   BUN <5 (L) 6 - 20 mg/dL   Creatinine, Ser 0.73 0.44 - 1.00 mg/dL   Calcium 8.4 (L) 8.9 - 10.3 mg/dL   GFR, Estimated >60 >60 mL/min    Comment: (NOTE) Calculated using the CKD-EPI Creatinine Equation (2021)    Anion gap 9 5 - 15    Comment: Performed at [facility], [place] [address] [place], [place] [id number]  Basic metabolic panel     Status: Abnormal   Collection Time: 05/22/22  6:40 AM  Result Value Ref Range   Sodium 137 135 - 145 mmol/L   Potassium 3.6 3.5 - 5.1 mmol/L   Chloride 103 98 - 111 mmol/L   CO2 24 22 - 32 mmol/L   Glucose, Bld 102 (H) 70 - 99 mg/dL    Comment: Glucose reference range applies only to samples taken  after fasting for at least 8 hours.   BUN 6 6 - 20 mg/dL   Creatinine, Ser 0.73 0.44 - 1.00 mg/dL   Calcium 8.5 (L) 8.9 - 10.3 mg/dL   GFR, Estimated >60 >60 mL/min    Comment: (NOTE) Calculated using the CKD-EPI Creatinine Equation (2021)    Anion gap 10 5 - 15    Comment: Performed at [facility], [place] [address], [place],  [address]   NM Hepato W/EF  Result Date: 05/21/2022 CLINICAL DATA:  Right upper quadrant abdominal pain, biliary disease suspected. EXAM: NUCLEAR MEDICINE HEPATOBILIARY IMAGING WITH GALLBLADDER EF TECHNIQUE: Sequential images of the abdomen were obtained out to 60 minutes following intravenous administration of radiopharmaceutical. After oral ingestion of Ensure, gallbladder ejection fraction was determined. At 60 min, normal ejection fraction is greater than 33%. RADIOPHARMACEUTICALS:  5.0 mCi Tc-37m  Choletec IV COMPARISON:  CT Jul 25, 2021 FINDINGS: Prompt uptake and biliary excretion of activity by the liver is seen. Delayed visualization of gallbladder activity at 70 minutes. Biliary activity passes into small bowel, consistent with patent common bile duct. Calculated gallbladder ejection fraction is 50%. (Normal gallbladder ejection fraction with Ensure is greater than 33%.) Deborah Kelly endorses abdominal pain upon ingestion of Ensure. IMPRESSION: 1.  Patent cystic and common bile ducts. 2.  Normal gallbladder ejection fraction. 3. Delayed visualization of gallbladder activity and endorsement of abdominal pain post ingestion of Ensure by Deborah Kelly is nonspecific and of indeterminate clinical significance but may reflect chronic cholecystitis with relatively preserved ejection fraction. Electronically Signed   By: Dahlia Bailiff M.D.   On: 05/21/2022 14:42      Assessment/Plan Periumbilical abdominal pain Recurrent intractable nausea and vomiting Chronic Gastroparesis  - consulted after HIDA showed possible chronic cholecystitis, Deborah Kelly cystic and  CBD, normal EF  - Deborah Kelly's symptoms and history not really consistent with biliary colic - LFTs WNL and no leukocytosis  - exam not consistent with acute cholecystitis  - no gallstones noted on any prior imaging - at this time it does not appear that Deborah Kelly's symptoms are consistent with biliary colic or cholecystitis. Would not recommend laparoscopic cholecystectomy urgently or emergently. I can not say  that cholecystectomy would improve Deborah Kelly's symptoms with any level or certainty or confidence and Deborah Kelly is not currently wanting to pursue surgery if we aren't sure that it would improve her symptoms. Surgical intervention also not low risk in setting of morbid obesity. Deborah Kelly is welcome to follow up electively if she decides she would like to discuss elective cholecystectomy further. At this time I would recommend continued GI follow up. General surgery will sign off but we are available if any questions or other surgical concerns arise.   FEN: Soft diet, IVF @75  cc/h VTE: LMWH 80 mg q24 h ID: no current abx  Morbid obesity - BMI 57.82 Graves disease Hx of PID with chronic pelvic pain Dysmenorrhea Bipolar 1 disorder Depression/Anxiety Hx of alcohol abuse Cannabinoid use - Deborah Kelly reports currently only using derivatives not marijuana  I reviewed Consultant GI notes, hospitalist notes, last 24 h vitals and pain scores, last 48 h intake and output, last 24 h labs and trends, last 24 h imaging results, and outside hospital records including psychiatry and GI notes and outside imaging and labs .    Norm Parcel, [facility] 05/22/2022, 10:31 AM Please see Amion for pager number during day hours 7:00am-4:30pm

## 2022-10-08 ENCOUNTER — Encounter (HOSPITAL_COMMUNITY): Payer: Self-pay | Admitting: *Deleted

## 2022-10-08 ENCOUNTER — Inpatient Hospital Stay (HOSPITAL_COMMUNITY)
Admission: EM | Admit: 2022-10-08 | Discharge: 2022-10-14 | DRG: 074 | Disposition: A | Discharge status: Home / Self Care | Payer: Commercial Managed Care - HMO | Attending: Internal Medicine | Admitting: Internal Medicine

## 2022-10-08 ENCOUNTER — Emergency Department (HOSPITAL_COMMUNITY): Payer: Commercial Managed Care - HMO

## 2022-10-08 ENCOUNTER — Other Ambulatory Visit: Payer: Self-pay

## 2022-10-08 DIAGNOSIS — R748 Abnormal levels of other serum enzymes: Secondary | ICD-10-CM | POA: Diagnosis present

## 2022-10-08 DIAGNOSIS — F319 Bipolar disorder, unspecified: Secondary | ICD-10-CM | POA: Diagnosis present

## 2022-10-08 DIAGNOSIS — G894 Chronic pain syndrome: Secondary | ICD-10-CM | POA: Diagnosis present

## 2022-10-08 DIAGNOSIS — K3184 Gastroparesis: Secondary | ICD-10-CM | POA: Diagnosis present

## 2022-10-08 DIAGNOSIS — F419 Anxiety disorder, unspecified: Secondary | ICD-10-CM | POA: Diagnosis present

## 2022-10-08 DIAGNOSIS — D72829 Elevated white blood cell count, unspecified: Secondary | ICD-10-CM | POA: Diagnosis present

## 2022-10-08 DIAGNOSIS — Z7984 Long term (current) use of oral hypoglycemic drugs: Secondary | ICD-10-CM

## 2022-10-08 DIAGNOSIS — F1290 Cannabis use, unspecified, uncomplicated: Secondary | ICD-10-CM | POA: Diagnosis present

## 2022-10-08 DIAGNOSIS — Z7151 Drug abuse counseling and surveillance of drug abuser: Secondary | ICD-10-CM

## 2022-10-08 DIAGNOSIS — Z87891 Personal history of nicotine dependence: Secondary | ICD-10-CM

## 2022-10-08 DIAGNOSIS — R112 Nausea with vomiting, unspecified: Secondary | ICD-10-CM | POA: Diagnosis not present

## 2022-10-08 DIAGNOSIS — Z560 Unemployment, unspecified: Secondary | ICD-10-CM

## 2022-10-08 DIAGNOSIS — E860 Dehydration: Secondary | ICD-10-CM | POA: Diagnosis present

## 2022-10-08 DIAGNOSIS — N179 Acute kidney failure, unspecified: Secondary | ICD-10-CM | POA: Diagnosis present

## 2022-10-08 DIAGNOSIS — G4700 Insomnia, unspecified: Secondary | ICD-10-CM | POA: Diagnosis not present

## 2022-10-08 DIAGNOSIS — Z91011 Allergy to milk products: Secondary | ICD-10-CM

## 2022-10-08 DIAGNOSIS — E039 Hypothyroidism, unspecified: Secondary | ICD-10-CM | POA: Insufficient documentation

## 2022-10-08 DIAGNOSIS — Z713 Dietary counseling and surveillance: Secondary | ICD-10-CM

## 2022-10-08 DIAGNOSIS — Z9151 Personal history of suicidal behavior: Secondary | ICD-10-CM

## 2022-10-08 DIAGNOSIS — R1115 Cyclical vomiting syndrome unrelated to migraine: Secondary | ICD-10-CM | POA: Diagnosis present

## 2022-10-08 DIAGNOSIS — F3170 Bipolar disorder, currently in remission, most recent episode unspecified: Secondary | ICD-10-CM

## 2022-10-08 DIAGNOSIS — R1010 Upper abdominal pain, unspecified: Secondary | ICD-10-CM

## 2022-10-08 DIAGNOSIS — Z6841 Body Mass Index (BMI) 40.0 and over, adult: Secondary | ICD-10-CM

## 2022-10-08 DIAGNOSIS — E038 Other specified hypothyroidism: Secondary | ICD-10-CM | POA: Diagnosis present

## 2022-10-08 DIAGNOSIS — Z79899 Other long term (current) drug therapy: Secondary | ICD-10-CM

## 2022-10-08 DIAGNOSIS — E876 Hypokalemia: Secondary | ICD-10-CM | POA: Diagnosis present

## 2022-10-08 DIAGNOSIS — E1143 Type 2 diabetes mellitus with diabetic autonomic (poly)neuropathy: Secondary | ICD-10-CM | POA: Diagnosis not present

## 2022-10-08 DIAGNOSIS — F1210 Cannabis abuse, uncomplicated: Secondary | ICD-10-CM | POA: Diagnosis present

## 2022-10-08 DIAGNOSIS — F12188 Cannabis abuse with other cannabis-induced disorder: Secondary | ICD-10-CM | POA: Diagnosis not present

## 2022-10-08 DIAGNOSIS — Z880 Allergy status to penicillin: Secondary | ICD-10-CM

## 2022-10-08 DIAGNOSIS — Z888 Allergy status to other drugs, medicaments and biological substances status: Secondary | ICD-10-CM

## 2022-10-08 LAB — COMPREHENSIVE METABOLIC PANEL
ALT: 20 U/L (ref 0–44)
AST: 24 U/L (ref 15–41)
Albumin: 4.6 g/dL (ref 3.5–5.0)
Alkaline Phosphatase: 93 U/L (ref 38–126)
Anion gap: 13 (ref 5–15)
BUN: 14 mg/dL (ref 6–20)
CO2: 27 mmol/L (ref 22–32)
Calcium: 9.6 mg/dL (ref 8.9–10.3)
Chloride: 105 mmol/L (ref 98–111)
Creatinine, Ser: 1.09 mg/dL — ABNORMAL HIGH (ref 0.44–1.00)
GFR, Estimated: >60 mL/min (ref >60)
Glucose, Bld: 151 mg/dL — ABNORMAL HIGH (ref 70–99)
Potassium: 3.1 mmol/L — ABNORMAL LOW (ref 3.5–5.1)
Sodium: 145 mmol/L (ref 135–145)
Total Bilirubin: 0.6 mg/dL (ref 0.3–1.2)
Total Protein: 8.6 g/dL — ABNORMAL HIGH (ref 6.5–8.1)

## 2022-10-08 LAB — CBC
HCT: 44.1 % (ref 36.0–46.0)
Hemoglobin: 14.2 g/dL (ref 12.0–15.0)
MCH: 28 pg (ref 26.0–34.0)
MCHC: 32.2 g/dL (ref 30.0–36.0)
MCV: 87 fL (ref 80.0–100.0)
Platelets: 504 10*3/uL — ABNORMAL HIGH (ref 150–400)
RBC: 5.07 MIL/uL (ref 3.87–5.11)
RDW: 14.5 % (ref 11.5–15.5)
WBC: 15.7 10*3/uL — ABNORMAL HIGH (ref 4.0–10.5)
nRBC: 0 % (ref 0.0–0.2)

## 2022-10-08 LAB — URINALYSIS, ROUTINE W REFLEX MICROSCOPIC
Bilirubin Urine: NEGATIVE
Glucose, UA: 50 mg/dL — AB
Ketones, ur: 80 mg/dL — AB
Leukocytes,Ua: NEGATIVE
Nitrite: NEGATIVE
Protein, ur: >=300 mg/dL — AB
Specific Gravity, Urine: 1.043 — ABNORMAL HIGH (ref 1.005–1.030)
pH: 5 (ref 5.0–8.0)

## 2022-10-08 LAB — RAPID URINE DRUG SCREEN, HOSP PERFORMED
Amphetamines: NOT DETECTED
Barbiturates: NOT DETECTED
Benzodiazepines: POSITIVE — AB
Cocaine: NOT DETECTED
Opiates: NOT DETECTED
Tetrahydrocannabinol: POSITIVE — AB

## 2022-10-08 LAB — HCG, SERUM, QUALITATIVE: Preg, Serum: NEGATIVE

## 2022-10-08 LAB — LIPASE, BLOOD: Lipase: 128 U/L — ABNORMAL HIGH (ref 11–51)

## 2022-10-08 LAB — MAGNESIUM: Magnesium: 2.1 mg/dL (ref 1.7–2.4)

## 2022-10-08 MED ORDER — SODIUM CHLORIDE 0.9 % IV BOLUS
500.0000 mL | Freq: Once | INTRAVENOUS | Status: AC
  Administered 2022-10-08: 500 mL via INTRAVENOUS

## 2022-10-08 MED ORDER — PROCHLORPERAZINE MALEATE 10 MG PO TABS: 5.0000 mg | ORAL_TABLET | Freq: Four times a day (QID) | ORAL | Status: DC | PRN

## 2022-10-08 MED ORDER — ONDANSETRON HCL 4 MG/2ML IJ SOLN
4.0000 mg | Freq: Once | INTRAMUSCULAR | Status: AC
  Administered 2022-10-08: 4 mg via INTRAVENOUS
  Filled 2022-10-08: qty 2

## 2022-10-08 MED ORDER — POTASSIUM CHLORIDE 10 MEQ/100ML IV SOLN
10.0000 meq | Freq: Once | INTRAVENOUS | Status: AC
  Administered 2022-10-08: 10 meq via INTRAVENOUS
  Filled 2022-10-08: qty 100

## 2022-10-08 MED ORDER — HYDROMORPHONE HCL 1 MG/ML IJ SOLN
1.0000 mg | INTRAMUSCULAR | Status: DC | PRN
  Administered 2022-10-08 – 2022-10-09 (×2): 1 mg via INTRAVENOUS
  Filled 2022-10-08 (×2): qty 1

## 2022-10-08 MED ORDER — SODIUM CHLORIDE 0.9 % IV BOLUS
2000.0000 mL | Freq: Once | INTRAVENOUS | Status: AC
  Administered 2022-10-08: 2000 mL via INTRAVENOUS

## 2022-10-08 MED ORDER — HYDROMORPHONE HCL 1 MG/ML IJ SOLN
1.0000 mg | Freq: Once | INTRAMUSCULAR | Status: AC
  Administered 2022-10-08: 1 mg via INTRAVENOUS
  Filled 2022-10-08: qty 1

## 2022-10-08 MED ORDER — CLONAZEPAM 0.5 MG PO TABS
1.0000 mg | ORAL_TABLET | Freq: Two times a day (BID) | ORAL | Status: DC
  Administered 2022-10-08: 1 mg via ORAL
  Filled 2022-10-08 (×2): qty 2

## 2022-10-08 MED ORDER — IOHEXOL 300 MG/ML  SOLN
100.0000 mL | Freq: Once | INTRAMUSCULAR | Status: AC | PRN
  Administered 2022-10-08: 100 mL via INTRAVENOUS

## 2022-10-08 MED ORDER — LAMOTRIGINE 100 MG PO TABS
200.0000 mg | ORAL_TABLET | Freq: Every day | ORAL | Status: DC
  Administered 2022-10-08: 200 mg via ORAL
  Filled 2022-10-08: qty 2

## 2022-10-08 MED ORDER — MORPHINE SULFATE (PF) 2 MG/ML IV SOLN: 1.0000 mg | INTRAVENOUS | Status: DC | PRN

## 2022-10-08 MED ORDER — ONDANSETRON HCL 4 MG/2ML IJ SOLN
4.0000 mg | Freq: Four times a day (QID) | INTRAMUSCULAR | Status: DC | PRN
  Administered 2022-10-08 – 2022-10-14 (×9): 4 mg via INTRAVENOUS
  Filled 2022-10-08 (×10): qty 2

## 2022-10-08 MED ORDER — HYDROMORPHONE HCL 1 MG/ML IJ SOLN
0.5000 mg | Freq: Once | INTRAMUSCULAR | Status: AC
  Administered 2022-10-08: 0.5 mg via INTRAVENOUS
  Filled 2022-10-08: qty 1

## 2022-10-08 MED ORDER — METOCLOPRAMIDE HCL 5 MG/ML IJ SOLN
10.0000 mg | Freq: Three times a day (TID) | INTRAMUSCULAR | Status: DC
  Administered 2022-10-08 – 2022-10-14 (×17): 10 mg via INTRAVENOUS
  Filled 2022-10-08 (×17): qty 2

## 2022-10-08 MED ORDER — ONDANSETRON HCL 4 MG PO TABS: 4.0000 mg | ORAL_TABLET | Freq: Four times a day (QID) | ORAL | Status: DC | PRN

## 2022-10-08 MED ORDER — ACETAMINOPHEN 650 MG RE SUPP: 650.0000 mg | Freq: Four times a day (QID) | RECTAL | Status: DC | PRN

## 2022-10-08 MED ORDER — SODIUM CHLORIDE 0.9% FLUSH
3.0000 mL | Freq: Two times a day (BID) | INTRAVENOUS | Status: DC
  Administered 2022-10-09 – 2022-10-14 (×8): 3 mL via INTRAVENOUS

## 2022-10-08 MED ORDER — ENOXAPARIN SODIUM 80 MG/0.8ML IJ SOSY
80.0000 mg | PREFILLED_SYRINGE | INTRAMUSCULAR | Status: DC
  Administered 2022-10-08 – 2022-10-13 (×6): 80 mg via SUBCUTANEOUS
  Filled 2022-10-08 (×6): qty 0.8

## 2022-10-08 MED ORDER — GABAPENTIN 100 MG PO CAPS
300.0000 mg | ORAL_CAPSULE | Freq: Three times a day (TID) | ORAL | Status: DC
  Administered 2022-10-08 – 2022-10-14 (×17): 300 mg via ORAL
  Filled 2022-10-08 (×8): qty 3
  Filled 2022-10-08: qty 1
  Filled 2022-10-08 (×6): qty 3
  Filled 2022-10-08: qty 1
  Filled 2022-10-08: qty 3

## 2022-10-08 MED ORDER — SODIUM CHLORIDE 0.9% FLUSH: 3.0000 mL | INTRAVENOUS | Status: DC | PRN

## 2022-10-08 MED ORDER — DEXTROSE IN LACTATED RINGERS 5 % IV SOLN: INTRAVENOUS | Status: DC

## 2022-10-08 MED ORDER — HYDRALAZINE HCL 20 MG/ML IJ SOLN: 10.0000 mg | Freq: Four times a day (QID) | INTRAMUSCULAR | Status: DC | PRN

## 2022-10-08 MED ORDER — LORAZEPAM 2 MG/ML IJ SOLN
0.5000 mg | Freq: Once | INTRAMUSCULAR | Status: AC
  Administered 2022-10-08: 0.5 mg via INTRAVENOUS
  Filled 2022-10-08: qty 1

## 2022-10-08 MED ORDER — PANTOPRAZOLE SODIUM 40 MG PO TBEC
40.0000 mg | DELAYED_RELEASE_TABLET | Freq: Every day | ORAL | Status: DC
  Administered 2022-10-08 – 2022-10-14 (×7): 40 mg via ORAL
  Filled 2022-10-08 (×8): qty 1

## 2022-10-08 MED ORDER — ACETAMINOPHEN 325 MG PO TABS
650.0000 mg | ORAL_TABLET | Freq: Four times a day (QID) | ORAL | Status: DC | PRN
  Administered 2022-10-10: 650 mg via ORAL
  Filled 2022-10-08: qty 2

## 2022-10-08 MED ORDER — PANTOPRAZOLE SODIUM 40 MG IV SOLR
40.0000 mg | Freq: Once | INTRAVENOUS | Status: AC
  Administered 2022-10-08: 40 mg via INTRAVENOUS
  Filled 2022-10-08: qty 10

## 2022-10-08 MED ORDER — HALOPERIDOL 5 MG PO TABS
10.0000 mg | ORAL_TABLET | Freq: Two times a day (BID) | ORAL | Status: DC
  Administered 2022-10-09: 10 mg via ORAL
  Filled 2022-10-08 (×2): qty 2

## 2022-10-08 MED ORDER — SODIUM CHLORIDE 0.9 % IV SOLN: 250.0000 mL | INTRAVENOUS | Status: DC | PRN

## 2022-10-08 MED ORDER — POTASSIUM CHLORIDE 10 MEQ/100ML IV SOLN
10.0000 meq | INTRAVENOUS | Status: AC
  Administered 2022-10-08 – 2022-10-09 (×2): 10 meq via INTRAVENOUS
  Filled 2022-10-08 (×3): qty 100

## 2022-10-08 NOTE — ED Provider Triage Note (Signed)
Emergency Medicine Provider Triage Evaluation Note  Deborah Kelly , a 32 y.o. female  was evaluated in triage.  Pt complains of abdominal pain, nausea, vomiting x 4 days. Localizes pain to the umbilicus. Similar to prior episodes.   Hx gastroparesis Takes Promethazine, zofran at home  Review of Systems  Positive: Abd pain, N/V Negative: Diarrhea, fever  Physical Exam  BP (!) 148/108 (BP Location: Right Arm)   Pulse 83   Temp 99.8 F (37.7 C) (Oral)   Resp 18   Ht 5\' 8"  (1.727 m)   Wt (!) 172.4 kg   SpO2 99%   BMI 57.78 kg/m  Gen:   Awake, no distress   Resp:  Normal effort  MSK:   Moves extremities without difficulty  Other:    Medical Decision Making  Medically screening exam initiated at 12:11 PM.  Appropriate orders placed.  Jarlene Acierno was informed that the remainder of the evaluation will be completed by another provider, this initial triage assessment does not replace that evaluation, and the importance of remaining in the ED until their evaluation is complete.  Labs ordered, received 100 mcg fentanyl from EMS + 4 mg zofran   Chanteria Haggard T, PA-C 10/08/22 1211

## 2022-10-08 NOTE — ED Provider Notes (Signed)
EMERGENCY DEPARTMENT AT [facility] Provider Note   CSN: [id number] Arrival date & time: 10/08/22  1047     History  Chief Complaint  Patient presents with   Abdominal Pain   Emesis    Deborah Kelly is a 32 y.o. female.  Patient has a history of gastroparesis and cannabis hyperemesis syndrome.  She presents with 4 days of persistent vomiting and abdominal pain.  The history is provided by the patient and medical records. No language interpreter was used.  Abdominal Pain Pain location:  Epigastric Pain quality: aching   Pain radiates to:  Does not radiate Pain severity:  Moderate Onset quality:  Sudden Timing:  Constant Progression:  Waxing and waning Chronicity:  Recurrent Context: not alcohol use   Relieved by:  Nothing Worsened by:  Nothing Associated symptoms: vomiting   Associated symptoms: no chest pain, no cough, no diarrhea, no fatigue and no hematuria   Emesis Associated symptoms: abdominal pain   Associated symptoms: no cough, no diarrhea and no headaches        Home Medications Prior to Admission medications   Medication Sig Start Date End Date Taking? Authorizing Provider  bisacodyl (DULCOLAX) 5 MG EC tablet Take 2 tablets (10 mg total) by mouth daily. Patient not taking: Reported on 05/17/2022 08/01/21   Osvaldo Shipper, MD  clonazePAM (KLONOPIN) 1 MG tablet Take 1 mg by mouth 2 (two) times daily. 04/17/21   [provider]  gabapentin (NEURONTIN) 300 MG capsule Take 300 mg by mouth 3 (three) times daily.    [provider]  haloperidol (HALDOL) 10 MG tablet Take 10 mg by mouth 2 (two) times daily.    [provider]  lamoTRIgine (LAMICTAL) 200 MG tablet Take 1 tablet (200 mg total) by mouth daily. Patient taking differently: Take 200 mg by mouth at bedtime. 08/14/21   Drema Dallas, MD  metFORMIN (GLUCOPHAGE-XR) 500 MG 24 hr tablet Take 500 mg by mouth daily.    [provider]  pantoprazole  (PROTONIX) 40 MG tablet Take 1 tablet (40 mg total) by mouth daily. Patient not taking: Reported on 07/15/2021 04/29/21 04/29/22  Pokhrel, Rebekah Chesterfield, MD  polyethylene glycol (MIRALAX / GLYCOLAX) 17 g packet Take 17 g by mouth daily. Patient not taking: Reported on 09/02/2021 08/01/21   Osvaldo Shipper, MD  potassium chloride SA (KLOR-CON M) 20 MEQ tablet Take 1 tablet (20 mEq total) by mouth 2 (two) times daily for 3 days. 05/22/22 05/25/22  Rhetta Mura, MD  prochlorperazine (COMPAZINE) 5 MG tablet Take 1 tablet (5 mg total) by mouth every 6 (six) hours as needed for nausea or vomiting. 05/22/22   Rhetta Mura, MD      Allergies    Penicillins, Amlodipine, and Dicyclomine    Review of Systems   Review of Systems  Constitutional:  Negative for appetite change and fatigue.  HENT:  Negative for congestion, ear discharge and sinus pressure.   Eyes:  Negative for discharge.  Respiratory:  Negative for cough.   Cardiovascular:  Negative for chest pain.  Gastrointestinal:  Positive for abdominal pain and vomiting. Negative for diarrhea.  Genitourinary:  Negative for frequency and hematuria.  Musculoskeletal:  Negative for back pain.  Skin:  Negative for rash.  Neurological:  Negative for seizures and headaches.  Psychiatric/Behavioral:  Negative for hallucinations.     Physical Exam Updated Vital Signs BP (!) 142/98   Pulse 87   Temp 98.2 F (36.8 C) (Oral)   Resp 16  Ht 5\' 8"  (1.727 m)   Wt (!) 172.4 kg   SpO2 96%   BMI 57.78 kg/m  Physical Exam Vitals and nursing note reviewed.  Constitutional:      Appearance: She is well-developed.  HENT:     Head: Normocephalic.     Nose: Nose normal.  Eyes:     General: No scleral icterus.    Conjunctiva/sclera: Conjunctivae normal.  Neck:     Thyroid: No thyromegaly.  Cardiovascular:     Rate and Rhythm: Normal rate and regular rhythm.     Heart sounds: No murmur heard.    No friction rub. No gallop.  Pulmonary:     Breath  sounds: No stridor. No wheezing or rales.  Chest:     Chest wall: No tenderness.  Abdominal:     General: There is no distension.     Tenderness: There is abdominal tenderness. There is no rebound.  Musculoskeletal:        General: Normal range of motion.     Cervical back: Neck supple.  Lymphadenopathy:     Cervical: No cervical adenopathy.  Skin:    Findings: No erythema or rash.  Neurological:     Mental Status: She is oriented to person, place, and time.     Motor: No abnormal muscle tone.     Coordination: Coordination normal.  Psychiatric:        Behavior: Behavior normal.     ED Results / Procedures / Treatments   Labs (all labs ordered are listed, but only abnormal results are displayed) Labs Reviewed  LIPASE, BLOOD - Abnormal; Notable for the following components:      Result Value   Lipase 128 (*)    All other components within normal limits  COMPREHENSIVE METABOLIC PANEL - Abnormal; Notable for the following components:   Potassium 3.1 (*)    Glucose, Bld 151 (*)    Creatinine, Ser 1.09 (*)    Total Protein 8.6 (*)    All other components within normal limits  CBC - Abnormal; Notable for the following components:   WBC 15.7 (*)    Platelets 504 (*)    All other components within normal limits  URINALYSIS, ROUTINE W REFLEX MICROSCOPIC - Abnormal; Notable for the following components:   APPearance HAZY (*)    Specific Gravity, Urine 1.043 (*)    Glucose, UA 50 (*)    Hgb urine dipstick MODERATE (*)    Ketones, ur 80 (*)    Protein, ur >=300 (*)    Bacteria, UA FEW (*)    All other components within normal limits  RAPID URINE DRUG SCREEN, HOSP PERFORMED - Abnormal; Notable for the following components:   Benzodiazepines POSITIVE (*)    Tetrahydrocannabinol POSITIVE (*)    All other components within normal limits  HCG, SERUM, QUALITATIVE  MAGNESIUM    EKG EKG Interpretation Date/Time:  Thursday October 08 2022 12:22:33 EDT Ventricular Rate:  87 PR  Interval:  186 QRS Duration:  119 QT Interval:  380 QTC Calculation: 458 R Axis:   8  Text Interpretation: Sinus rhythm Consider left atrial enlargement Incomplete right bundle branch block Confirmed by Bethann Berkshire [phone]) on 10/08/2022 8:00:01 PM  Radiology CT ABDOMEN PELVIS W CONTRAST  Result Date: 10/08/2022 CLINICAL DATA:  Pain.  Nausea and vomiting for 4 days. EXAM: CT ABDOMEN AND PELVIS WITH CONTRAST TECHNIQUE: Multidetector CT imaging of the abdomen and pelvis was performed using the standard protocol following bolus administration of intravenous contrast.  RADIATION DOSE REDUCTION: This exam was performed according to the departmental dose-optimization program which includes automated exposure control, adjustment of the mA and/or kV according to patient size and/or use of iterative reconstruction technique. CONTRAST:  OMNIPAQUE IOHEXOL 300 MG/ML  SOLN COMPARISON:  CT 07/24/2021 and older. HIDA scan 05/21/2022. Gastric emptying study 2023 May FINDINGS: Lower chest: Lung bases are grossly clear.  No pleural effusion. Hepatobiliary: No focal liver abnormality is seen. No gallstones, gallbladder wall thickening, or biliary dilatation. Patent portal vein. Pancreas: Unremarkable. No pancreatic ductal dilatation or surrounding inflammatory changes. Spleen: Normal in size without focal abnormality. Adrenals/Urinary Tract: Adrenal glands are unremarkable. Kidneys are normal, without renal calculi, focal lesion, or hydronephrosis. Bladder is unremarkable. Stomach/Bowel: Stomach is within normal limits. Appendix appears normal. No evidence of bowel wall thickening, distention, or inflammatory changes. Vascular/Lymphatic: No significant vascular findings are present. No enlarged abdominal or pelvic lymph nodes. Reproductive: Uterus and bilateral adnexa are unremarkable. Other: No abdominal wall hernia or abnormality. No abdominopelvic ascites. Musculoskeletal: No acute or significant osseous findings.  IMPRESSION: No bowel obstruction, free air or free fluid.  Normal appendix. Electronically Signed   By: Karen Kays M.D.   On: 10/08/2022 18:12    Procedures Procedures    Medications Ordered in ED Medications  sodium chloride 0.9 % bolus 500 mL (500 mLs Intravenous New Bag/Given 10/08/22 1737)  pantoprazole (PROTONIX) injection 40 mg (40 mg Intravenous Given 10/08/22 1736)  HYDROmorphone (DILAUDID) injection 0.5 mg (0.5 mg Intravenous Given 10/08/22 1736)  ondansetron (ZOFRAN) injection 4 mg (4 mg Intravenous Given 10/08/22 1736)  LORazepam (ATIVAN) injection 0.5 mg (0.5 mg Intravenous Given 10/08/22 1741)  iohexol (OMNIPAQUE) 300 MG/ML solution 100 mL (100 mLs Intravenous Contrast Given 10/08/22 1746)  HYDROmorphone (DILAUDID) injection 1 mg (1 mg Intravenous Given 10/08/22 1819)  sodium chloride 0.9 % bolus 2,000 mL (2,000 mLs Intravenous New Bag/Given 10/08/22 1857)  ondansetron (ZOFRAN) injection 4 mg (4 mg Intravenous Given 10/08/22 1853)  HYDROmorphone (DILAUDID) injection 1 mg (1 mg Intravenous Given 10/08/22 1853)  LORazepam (ATIVAN) injection 0.5 mg (0.5 mg Intravenous Given 10/08/22 1855)  potassium chloride 10 mEq in 100 mL IVPB (10 mEq Intravenous New Bag/Given 10/08/22 1858)    ED Course/ Medical Decision Making/ A&P                                 Medical Decision Making Amount and/or Complexity of Data Reviewed Labs: ordered. Radiology: ordered.  Risk Prescription drug management. Decision regarding hospitalization.       This patient presents to the ED for concern of vomiting abdominal pain, this involves an extensive number of treatment options, and is a complaint that carries with it a high risk of complications and morbidity.  The differential diagnosis includes gastritis, appendicitis, gastroparesis and cannabis hyperemesis syndrome   Co morbidities that complicate the patient evaluation  Obesity, substance abuse   Additional history obtained:  Additional history  obtained from patient External records from outside source obtained and reviewed including hospital records   Lab Tests:  I Ordered, and personally interpreted labs.  The pertinent results include: Lipase 128 and marijuana positive   Imaging Studies ordered:  I ordered imaging studies including CT abdomen I independently visualized and interpreted imaging which showed no acute disease I agree with the radiologist interpretation   Cardiac Monitoring: / EKG:  The patient was maintained on a cardiac monitor.  I personally viewed and interpreted  the cardiac monitored which showed an underlying rhythm of: Normal sinus rhythm   Consultations Obtained:  I requested consultation with the hospitalist,  and discussed lab and imaging findings as well as pertinent plan - they recommend: Admit   Problem List / ED Course / Critical interventions / Medication management  Hyperemesis and obesity I ordered medication including normal saline for dehydration and Dilaudid for pain Reevaluation of the patient after these medicines showed that the patient stayed the same I have reviewed the patients home medicines and have made adjustments as needed   Social Determinants of Health:  None   Test / Admission - Considered:  None    Patient with persistent vomiting and abdominal pain with AKI and hypokalemia.  She will be admitted to medicine        Final Clinical Impression(s) / ED Diagnoses Final diagnoses:  Gastroparesis  Pain of upper abdomen    Rx / DC Orders ED Discharge Orders     None         Bethann Berkshire, MD 10/10/22 1225

## 2022-10-08 NOTE — ED Triage Notes (Signed)
BIB EMS with 4 days of intense N/V and abd pain, #22 Left AC with 100 mcg Fentanyl 4mg  Zofran 140/p-90-18-95% CBG 167

## 2022-10-08 NOTE — ED Notes (Signed)
ED Provider at bedside. 

## 2022-10-08 NOTE — H&P (Signed)
History and Physical    Deborah Kelly OZH:[id number] DOB: March 28, 1990 DOA: 10/08/2022  PCP: Pcp, No   Patient coming from: Home   Chief Complaint:  Chief Complaint  Patient presents with   Abdominal Pain   Emesis    HPI:  Deborah Kelly is a 32 y.o. female with medical history significant of bipolar disorder type I, idiopathic gastroparesis, chronic cannabis use,  bipolar disorder, hypothyroidism secondary to Graves' disease, chronic pain syndrome and morbid obesity presented to emergency department with complaining of nausea vomiting for last 4 to 5 days and unable to tolerate oral diet.  Patient reported she has intractable nausea vomiting like this episodes every 2 to 3 weeks.  Patient reported smoking marijuana occasionally.  Per chart review patient has previous hospital admission in March 2024 due to intractable nausea vomiting the setting of idiopathic gastroparesis and concomitant hyperemesis cannabinoid syndrome.    Per chart review previous workup at [facility] 03/27/2022 [facility]-underwent abdominal surgery for PID and developed N/V with workup revealing gastric emptying prior therapy included Reglan and erythromycin and on recent admission had to use Ativan amitriptyline and P--gastric emptying study 07/2021 on opioids 33% emptying at 4 hours EGD showed grade C esophagitis Prucalopride .   ED Course:  Initial presentation to ED heart rate 83, respiratory 18, blood pressure 148/102 and O2 sat 99% room air. Lab work, CMP showed sodium 145, potassium slightly low 3.1, pCO2 27, blood glucose 151, BUN 14, elevated creatinine 1.09, normal AST/ALT/ALP, GFR above 60 anion gap 13. Elevated lipase 128. CMP showed slight elevated WBC count 15.7, RBC 5, hemoglobin 14.2 and platelet 504. Pregnancy test negative. UDS positive with benzodiazepine and THC.  CT abdomen pelvis obtained which did not showed any evidence of bowel obstruction, free air or free fluid.  No gallstone. Normal appendix.  In  the ED patient received hydromorphone 1 mg x 2, Ativan 0.5 mg x 2, Zofran 4 mg x 2, Protonix 40 mg IV once, KCl 10 mEq and NS 2.5 L bolus.   Review of Systems:  Review of Systems  Constitutional:  Negative for chills, fever, malaise/fatigue and weight loss.  Respiratory:  Negative for cough and shortness of breath.   Cardiovascular:  Negative for chest pain, palpitations and leg swelling.  Gastrointestinal:  Positive for abdominal pain, nausea and vomiting. Negative for blood in stool, constipation, diarrhea and heartburn.  Genitourinary:  Negative for dysuria, frequency and urgency.  Musculoskeletal:  Negative for back pain, myalgias and neck pain.  Skin:  Negative for itching and rash.  Neurological:  Negative for dizziness and headaches.  Psychiatric/Behavioral:  Positive for substance abuse. The patient is nervous/anxious.     Past Medical History:  Diagnosis Date   Alcohol abuse 05/19/2017   Anxiety    Bipolar 1 disorder (HCC)    Bipolar affective disorder, depressed (HCC) 01/20/2012   Cannabis misuse 03/15/2021   Chronic pelvic pain in female 07/08/2020   Depression    Dysmenorrhea 07/08/2020   Elevated prolactin level 03/15/2021   Gastroparesis    Graves disease    History of suicide attempt 05/18/2017   Hyperthyroidism    Intractable vomiting 04/27/2021    History reviewed. No pertinent surgical history.   reports that she has quit smoking. Her smoking use included cigarettes. She has never used smokeless tobacco. She reports that she does not currently use alcohol. She reports current drug use. Drug: Marijuana.  Allergies  Allergen Reactions   Lisinopril Anaphylaxis    Mother had anaphylactic rxn to  [facility]   Penicillins Rash, Swelling, Hives and Shortness Of Breath    Throat swells  Other Reaction(s): Other (See Comments)   Amlodipine Hives    Other Reaction(s): Not available, Other (See Comments)   Dicyclomine Other (See Comments) and Rash    Pt states it makes her hands  shake;  Other Reaction(s): Not available, Other (See Comments)  Pt states it makes her hands shake;     Pt states it makes her hands shake; pt also states a rash.     Pt states it makes her hands shake; pt also states a rash.   Pt states it makes her hands shake;  Pt states it makes her hands shake; pt also states a rash.     Pt states it makes her hands shake;   Milk Protein Diarrhea    History reviewed. No pertinent family history.  Prior to Admission medications   Medication Sig Start Date End Date Taking? Authorizing Provider  bisacodyl (DULCOLAX) 5 MG EC tablet Take 2 tablets (10 mg total) by mouth daily. Patient not taking: Reported on 05/17/2022 08/01/21   Osvaldo Shipper, MD  clonazePAM (KLONOPIN) 1 MG tablet Take 1 mg by mouth 2 (two) times daily. 04/17/21   [provider]  gabapentin (NEURONTIN) 300 MG capsule Take 300 mg by mouth 3 (three) times daily.    [provider]  haloperidol (HALDOL) 10 MG tablet Take 10 mg by mouth 2 (two) times daily.    [provider]  lamoTRIgine (LAMICTAL) 200 MG tablet Take 1 tablet (200 mg total) by mouth daily. Patient taking differently: Take 200 mg by mouth at bedtime. 08/14/21   Drema Dallas, MD  metFORMIN (GLUCOPHAGE-XR) 500 MG 24 hr tablet Take 500 mg by mouth daily.    [provider]  pantoprazole (PROTONIX) 40 MG tablet Take 1 tablet (40 mg total) by mouth daily. Patient not taking: Reported on 07/15/2021 04/29/21 04/29/22  Pokhrel, Rebekah Chesterfield, MD  polyethylene glycol (MIRALAX / GLYCOLAX) 17 g packet Take 17 g by mouth daily. Patient not taking: Reported on 09/02/2021 08/01/21   Osvaldo Shipper, MD  potassium chloride SA (KLOR-CON M) 20 MEQ tablet Take 1 tablet (20 mEq total) by mouth 2 (two) times daily for 3 days. 05/22/22 05/25/22  Rhetta Mura, MD  prochlorperazine (COMPAZINE) 5 MG tablet Take 1 tablet (5 mg total) by mouth every 6 (six) hours as needed for nausea or vomiting. 05/22/22   Rhetta Mura, MD     Physical Exam: Vitals:   10/08/22 1102 10/08/22 1720 10/08/22 1911 10/08/22 2126  BP:  121/89 (!) 142/98   Pulse:  84 87   Resp:  16 16   Temp:  98.2 F (36.8 C)  (!) 97.5 F (36.4 C)  TempSrc:  Oral  Oral  SpO2:  98% 96%   Weight: (!) 172.4 kg     Height: 5\' 8"  (1.727 m)       Physical Exam Constitutional:      General: She is not in acute distress.    Appearance: She is obese. She is not ill-appearing.  HENT:     Mouth/Throat:     Mouth: Mucous membranes are moist.  Cardiovascular:     Rate and Rhythm: Normal rate and regular rhythm.  Pulmonary:     Effort: Pulmonary effort is normal.     Breath sounds: Normal breath sounds.  Abdominal:     General: Bowel sounds are normal. There is no distension.  Palpations: Abdomen is rigid. There is no hepatomegaly or mass.     Tenderness: There is no abdominal tenderness. There is no guarding or rebound.     Hernia: There is no hernia in the umbilical area.  Skin:    General: Skin is dry.     Capillary Refill: Capillary refill takes less than 2 seconds.  Neurological:     Mental Status: She is oriented to person, place, and time.  Psychiatric:        Mood and Affect: Mood is anxious. Mood is not depressed.      Labs on Admission: I have personally reviewed following labs and imaging studies  CBC: Recent Labs  Lab 10/08/22 1200  WBC 15.7*  HGB 14.2  HCT 44.1  MCV 87.0  PLT 504*   Basic Metabolic Panel: Recent Labs  Lab 10/08/22 1200 10/08/22 1210  NA 145  --   K 3.1*  --   CL 105  --   CO2 27  --   GLUCOSE 151*  --   BUN 14  --   CREATININE 1.09*  --   CALCIUM 9.6  --   MG  --  2.1   GFR: Estimated Creatinine Clearance: 129 mL/min (A) (by C-G formula based on SCr of 1.09 mg/dL (H)). Liver Function Tests: Recent Labs  Lab 10/08/22 1200  AST 24  ALT 20  ALKPHOS 93  BILITOT 0.6  PROT 8.6*  ALBUMIN 4.6   Recent Labs  Lab 10/08/22 1200  LIPASE 128*   No results for  input(s): "AMMONIA" in the last 168 hours. Coagulation Profile: No results for input(s): "INR", "PROTIME" in the last 168 hours. Cardiac Enzymes: No results for input(s): "CKTOTAL", "CKMB", "CKMBINDEX", "TROPONINI", "TROPONINIHS" in the last 168 hours. BNP (last 3 results) No results for input(s): "BNP" in the last 8760 hours. HbA1C: No results for input(s): "HGBA1C" in the last 72 hours. CBG: No results for input(s): "GLUCAP" in the last 168 hours. Lipid Profile: No results for input(s): "CHOL", "HDL", "LDLCALC", "TRIG", "CHOLHDL", "LDLDIRECT" in the last 72 hours. Thyroid Function Tests: No results for input(s): "TSH", "T4TOTAL", "FREET4", "T3FREE", "THYROIDAB" in the last 72 hours. Anemia Panel: No results for input(s): "VITAMINB12", "FOLATE", "FERRITIN", "TIBC", "IRON", "RETICCTPCT" in the last 72 hours. Urine analysis:    Component Value Date/Time   COLORURINE YELLOW 10/08/2022 1747   APPEARANCEUR HAZY (A) 10/08/2022 1747   LABSPEC 1.043 (H) 10/08/2022 1747   PHURINE 5.0 10/08/2022 1747   GLUCOSEU 50 (A) 10/08/2022 1747   HGBUR MODERATE (A) 10/08/2022 1747   BILIRUBINUR NEGATIVE 10/08/2022 1747   KETONESUR 80 (A) 10/08/2022 1747   PROTEINUR >=300 (A) 10/08/2022 1747   NITRITE NEGATIVE 10/08/2022 1747   LEUKOCYTESUR NEGATIVE 10/08/2022 1747    Radiological Exams on Admission: I have personally reviewed images CT ABDOMEN PELVIS W CONTRAST  Result Date: 10/08/2022 CLINICAL DATA:  Pain.  Nausea and vomiting for 4 days. EXAM: CT ABDOMEN AND PELVIS WITH CONTRAST TECHNIQUE: Multidetector CT imaging of the abdomen and pelvis was performed using the standard protocol following bolus administration of intravenous contrast. RADIATION DOSE REDUCTION: This exam was performed according to the departmental dose-optimization program which includes automated exposure control, adjustment of the mA and/or kV according to patient size and/or use of iterative reconstruction technique. CONTRAST:   OMNIPAQUE IOHEXOL 300 MG/ML  SOLN COMPARISON:  CT 07/24/2021 and older. HIDA scan 05/21/2022. Gastric emptying study 2023 May FINDINGS: Lower chest: Lung bases are grossly clear.  No pleural effusion. Hepatobiliary:  No focal liver abnormality is seen. No gallstones, gallbladder wall thickening, or biliary dilatation. Patent portal vein. Pancreas: Unremarkable. No pancreatic ductal dilatation or surrounding inflammatory changes. Spleen: Normal in size without focal abnormality. Adrenals/Urinary Tract: Adrenal glands are unremarkable. Kidneys are normal, without renal calculi, focal lesion, or hydronephrosis. Bladder is unremarkable. Stomach/Bowel: Stomach is within normal limits. Appendix appears normal. No evidence of bowel wall thickening, distention, or inflammatory changes. Vascular/Lymphatic: No significant vascular findings are present. No enlarged abdominal or pelvic lymph nodes. Reproductive: Uterus and bilateral adnexa are unremarkable. Other: No abdominal wall hernia or abnormality. No abdominopelvic ascites. Musculoskeletal: No acute or significant osseous findings. IMPRESSION: No bowel obstruction, free air or free fluid.  Normal appendix. Electronically Signed   By: Karen Kays M.D.   On: 10/08/2022 18:12    EKG: My personal interpretation of EKG shows: EKG showed normal sinus rhythm heart rate 87.  No ST and T wave abnormality.     Assessment/Plan: Principal Problem:   Cannabis hyperemesis syndrome concurrent with and due to cannabis abuse (HCC) Active Problems:   Cannabinoid hyperemesis syndrome   Intractable nausea and vomiting   Idiopathic gastroparesis   Obesity, Class III, BMI 40-49.9 (morbid obesity) (HCC)   Bipolar disorder (HCC)   Hypokalemia   Leukocytosis   Elevated lipase   Hypothyroidism   AKI (acute kidney injury) (HCC)    Assessment and Plan: Intractable nausea/vomiting secondary to cannabis use Cannabinoid hyperemesis syndrome History of idiopathic  gastroparesis - Patient coming with complaining of intractable nausea and vomiting for last 4 days.  Unable to tolerate any oral diet at home.  She is complaining of generalized abdominal pain.  Initial workup in the ED showed patient is hemodynamically stable.  Patient is stating that after receiving 2.5 mg Dilaudid her abdominal pain has not resolved and she is requesting for Dilaudid 1 mg as morphine does not help with the abdominal pain per patient.  Slightly elevated WBC count 15.7.  CMP unremarkable except elevated creatinine 1.01.  Elevated lipase 128. - CT abdomen pelvis unremarkable finding for any acute abdomen.  No evidence of bowel obstruction.  No gallstone.  Normal appendix. - In the ED patient received hydromorphone 1 mg x 2, Ativan 0.5 mg x 2, Zofran 4 mg x 2, Protonix 40 mg IV once, KCl 10 mEq and NS 2.5 L bolus. -Given CT abdomen pelvis did not showed any evidence of acute abdominal finding nausea vomiting secondary from cannabinoid hyperemesis syndrome and underlying idiopathic gastroparesis - Plan to continue Reglan 10 mg every 8 hours scheduled and Zofran 4 mg IV every 6 hours as needed. -Continue Dilaudid 1 mg every 4 hours as needed for next 24 hours. -Continue clear liquid diet and advance diet as patient tolerates - Continue gentle hydration with LR 75 cc/h for next 1 day. -Continue to monitor for improvement of nausea vomiting and oral tolerance. -Currently patient is not in pleasant mood for counseling for THC smoking cessation.  Consulted case management for consult for substance abuse cessation.   Elevated lipase -Slight elevated lipase 128.  Normal AST, ALT, ALP and bilirubin level. - CT abdomen pelvis no acute abdominal finding - Elevated likely secondary from dehydration.  Continue to monitor  Hypokalemia -Slightly low potassium 3.1. -Replating with IV Kcl 10x4 meq. Checking mag and Phos level. -Continue to replete electrolyte as needed  Prerenal AKI in the  setting of poor oral hydration  -Elevated creatinine 1.09.  Baseline normal renal function and GFR - Prerenal AKI in  the setting of poor oral intake and persistent nausea vomiting - In the ED patient is total 2.5 L of NS.  Continue LR 75 cc/h for 1 day.  Encouraged oral hydration.   Leukocytosis -Slightly elevated WBC count 15.7.  Patient is afebrile.  No concern of active infection at this time.   -Reactive leukocytosis in the setting of nausea vomiting and THC use.  Continue to monitor.  Bipolar disorder type I -Resumed home Klonopin 1 mg twice daily, Haldol 10 mg twice daily, Lamictal 100 mg daily. -On discharge patient need to follow-up and establish care with psychiatry  Hypothyroidism secondary from Graves' disease -Not currently on any medication at home.  Checking TSH level.  DVT prophylaxis:  Lovenox Code Status:  Full Code Diet: Clear liquid diet.  Advance diet as patient tolerates Family Communication: Discussed treatment plan with patient Disposition Plan: Plan to discharge patient in next 1 to 2 days Consults: Transition care team Admission status:   Observation, Med-Surg  Severity of Illness: The appropriate patient status for this patient is OBSERVATION. Observation status is judged to be reasonable and necessary in order to provide the required intensity of service to ensure the patient's safety. The patient's presenting symptoms, physical exam findings, and initial radiographic and laboratory data in the context of their medical condition is felt to place them at decreased risk for further clinical deterioration. Furthermore, it is anticipated that the patient will be medically stable for discharge from the hospital within 2 midnights of admission.     Tereasa Coop, MD [facility]  How to contact the [facility] Attending or Consulting provider 7A - 7P or covering provider during after hours 7P -7A, for this patient.  Check the care team in [facility] and look for a)  attending/consulting [facility] provider listed and b) the [facility] team listed Log into [email or web address] and use [place]'s universal password to access. If you do not have the password, please contact the hospital operator. Locate the [facility] provider you are looking for under [facility] and page to a number that you can be directly reached. If you still have difficulty reaching the provider, please page the [facility] (Director on Call) for the Hospitalists listed on amion for assistance.  10/08/2022, 9:59 PM

## 2022-10-08 NOTE — ED Notes (Signed)
Patient transported to CT 

## 2022-10-08 NOTE — ED Notes (Signed)
Ambulated without assistance to restroom.

## 2022-10-09 DIAGNOSIS — Z6841 Body Mass Index (BMI) 40.0 and over, adult: Secondary | ICD-10-CM | POA: Diagnosis not present

## 2022-10-09 DIAGNOSIS — R112 Nausea with vomiting, unspecified: Secondary | ICD-10-CM | POA: Diagnosis present

## 2022-10-09 DIAGNOSIS — Z888 Allergy status to other drugs, medicaments and biological substances status: Secondary | ICD-10-CM | POA: Diagnosis not present

## 2022-10-09 DIAGNOSIS — E860 Dehydration: Secondary | ICD-10-CM | POA: Diagnosis present

## 2022-10-09 DIAGNOSIS — Z880 Allergy status to penicillin: Secondary | ICD-10-CM | POA: Diagnosis not present

## 2022-10-09 DIAGNOSIS — Z91011 Allergy to milk products: Secondary | ICD-10-CM | POA: Diagnosis not present

## 2022-10-09 DIAGNOSIS — F1210 Cannabis abuse, uncomplicated: Secondary | ICD-10-CM | POA: Diagnosis present

## 2022-10-09 DIAGNOSIS — Z7984 Long term (current) use of oral hypoglycemic drugs: Secondary | ICD-10-CM | POA: Diagnosis not present

## 2022-10-09 DIAGNOSIS — F319 Bipolar disorder, unspecified: Secondary | ICD-10-CM | POA: Diagnosis present

## 2022-10-09 DIAGNOSIS — E038 Other specified hypothyroidism: Secondary | ICD-10-CM | POA: Diagnosis present

## 2022-10-09 DIAGNOSIS — R748 Abnormal levels of other serum enzymes: Secondary | ICD-10-CM | POA: Diagnosis present

## 2022-10-09 DIAGNOSIS — Z87891 Personal history of nicotine dependence: Secondary | ICD-10-CM | POA: Diagnosis not present

## 2022-10-09 DIAGNOSIS — K3184 Gastroparesis: Secondary | ICD-10-CM | POA: Diagnosis present

## 2022-10-09 DIAGNOSIS — N179 Acute kidney failure, unspecified: Secondary | ICD-10-CM | POA: Diagnosis present

## 2022-10-09 DIAGNOSIS — G894 Chronic pain syndrome: Secondary | ICD-10-CM | POA: Diagnosis present

## 2022-10-09 DIAGNOSIS — F12188 Cannabis abuse with other cannabis-induced disorder: Secondary | ICD-10-CM | POA: Diagnosis not present

## 2022-10-09 DIAGNOSIS — G4700 Insomnia, unspecified: Secondary | ICD-10-CM | POA: Diagnosis not present

## 2022-10-09 DIAGNOSIS — Z9151 Personal history of suicidal behavior: Secondary | ICD-10-CM | POA: Diagnosis not present

## 2022-10-09 DIAGNOSIS — Z79899 Other long term (current) drug therapy: Secondary | ICD-10-CM | POA: Diagnosis not present

## 2022-10-09 DIAGNOSIS — E1143 Type 2 diabetes mellitus with diabetic autonomic (poly)neuropathy: Secondary | ICD-10-CM | POA: Diagnosis present

## 2022-10-09 DIAGNOSIS — F419 Anxiety disorder, unspecified: Secondary | ICD-10-CM | POA: Diagnosis present

## 2022-10-09 DIAGNOSIS — E876 Hypokalemia: Secondary | ICD-10-CM | POA: Diagnosis present

## 2022-10-09 DIAGNOSIS — R1115 Cyclical vomiting syndrome unrelated to migraine: Secondary | ICD-10-CM | POA: Diagnosis present

## 2022-10-09 DIAGNOSIS — D72829 Elevated white blood cell count, unspecified: Secondary | ICD-10-CM | POA: Diagnosis present

## 2022-10-09 LAB — GLUCOSE, CAPILLARY
Glucose-Capillary: 107 mg/dL — ABNORMAL HIGH (ref 70–99)
Glucose-Capillary: 99 mg/dL (ref 70–99)

## 2022-10-09 LAB — CBG MONITORING, ED
Glucose-Capillary: 110 mg/dL — ABNORMAL HIGH (ref 70–99)
Glucose-Capillary: 155 mg/dL — ABNORMAL HIGH (ref 70–99)

## 2022-10-09 MED ORDER — POTASSIUM CHLORIDE 2 MEQ/ML IV SOLN
INTRAVENOUS | Status: DC
  Filled 2022-10-09 (×7): qty 1000

## 2022-10-09 MED ORDER — HALOPERIDOL 5 MG PO TABS
10.0000 mg | ORAL_TABLET | Freq: Every day | ORAL | Status: DC
  Administered 2022-10-09 – 2022-10-14 (×5): 10 mg via ORAL
  Filled 2022-10-09 (×5): qty 2

## 2022-10-09 MED ORDER — POTASSIUM CHLORIDE 10 MEQ/100ML IV SOLN
10.0000 meq | INTRAVENOUS | Status: AC
  Administered 2022-10-09 (×5): 10 meq via INTRAVENOUS
  Filled 2022-10-09 (×5): qty 100

## 2022-10-09 MED ORDER — PROCHLORPERAZINE EDISYLATE 10 MG/2ML IJ SOLN
10.0000 mg | Freq: Four times a day (QID) | INTRAMUSCULAR | Status: DC | PRN
  Administered 2022-10-09 – 2022-10-14 (×13): 10 mg via INTRAVENOUS
  Filled 2022-10-09 (×13): qty 2

## 2022-10-09 MED ORDER — LORAZEPAM 2 MG/ML IJ SOLN
1.0000 mg | Freq: Two times a day (BID) | INTRAMUSCULAR | Status: DC
  Administered 2022-10-09 – 2022-10-13 (×9): 1 mg via INTRAVENOUS
  Filled 2022-10-09 (×8): qty 1

## 2022-10-09 MED ORDER — HYDROMORPHONE HCL 2 MG/ML IJ SOLN: 2.0000 mg | INTRAMUSCULAR | Status: DC | PRN

## 2022-10-09 MED ORDER — LAMOTRIGINE 100 MG PO TABS
400.0000 mg | ORAL_TABLET | Freq: Every day | ORAL | Status: DC
  Administered 2022-10-09 – 2022-10-13 (×5): 400 mg via ORAL
  Filled 2022-10-09 (×5): qty 4

## 2022-10-09 MED ORDER — KCL-LACTATED RINGERS 20 MEQ/L IV SOLN: INTRAVENOUS | Status: DC

## 2022-10-09 MED ORDER — INSULIN ASPART 100 UNIT/ML IJ SOLN
0.0000 [IU] | Freq: Three times a day (TID) | INTRAMUSCULAR | Status: DC
  Administered 2022-10-09: 1 [IU] via SUBCUTANEOUS
  Filled 2022-10-09: qty 0.06

## 2022-10-09 MED ORDER — KCL-LACTATED RINGERS 20 MEQ/L IV SOLN
INTRAVENOUS | Status: DC
  Filled 2022-10-09 (×2): qty 1000

## 2022-10-09 MED ORDER — INSULIN ASPART 100 UNIT/ML IJ SOLN
0.0000 [IU] | Freq: Every day | INTRAMUSCULAR | Status: DC
  Filled 2022-10-09: qty 0.05

## 2022-10-09 MED ORDER — HYDROMORPHONE HCL 1 MG/ML IJ SOLN: 1.0000 mg | INTRAMUSCULAR | Status: DC | PRN

## 2022-10-09 MED ORDER — HYDROMORPHONE HCL 1 MG/ML IJ SOLN
1.0000 mg | Freq: Once | INTRAMUSCULAR | Status: AC
  Administered 2022-10-09: 1 mg via INTRAVENOUS
  Filled 2022-10-09: qty 1

## 2022-10-09 MED ORDER — LORAZEPAM 2 MG/ML IJ SOLN
1.0000 mg | Freq: Four times a day (QID) | INTRAMUSCULAR | Status: DC | PRN
  Administered 2022-10-09 – 2022-10-14 (×7): 1 mg via INTRAVENOUS
  Filled 2022-10-09 (×8): qty 1

## 2022-10-09 MED ORDER — HYDROMORPHONE HCL 1 MG/ML IJ SOLN
1.0000 mg | INTRAMUSCULAR | Status: AC | PRN
  Administered 2022-10-09: 1 mg via INTRAVENOUS
  Administered 2022-10-09 – 2022-10-10 (×8): 2 mg via INTRAVENOUS
  Filled 2022-10-09 (×5): qty 2
  Filled 2022-10-09: qty 1
  Filled 2022-10-09 (×3): qty 2

## 2022-10-09 MED ORDER — POTASSIUM CHLORIDE 10 MEQ/100ML IV SOLN
10.0000 meq | Freq: Once | INTRAVENOUS | Status: AC
  Administered 2022-10-09: 10 meq via INTRAVENOUS

## 2022-10-09 NOTE — Progress Notes (Signed)
PROGRESS NOTE    Deborah Kelly  ZOX:[id number] DOB: 1991/01/31 DOA: 10/08/2022 PCP: Pcp, No    Brief Narrative:   Deborah Kelly is a 32 y.o. female with past medical history significant for bipolar disorder type I, idiopathic gastroparesis, chronic THC abuse, hypothyroidism secondary to Graves' disease, chronic pain syndrome, morbid obesity with multiple ED visits/hospitalizations for similar complaints who once again presented to [facility] ED on 8/1 complaining of nausea/vomiting over the last 4-5 days.  Reports unable to tolerate oral intake.  She reports this reoccurs every 2-3 weeks.  She reports has lost her job, currently undergoing eviction with plan to move in with her parents in [place].  Denies any sick contacts, no changes in her medications but does endorse continued marijuana use.  Per chart review patient has previous hospital admission in March 2024 due to intractable nausea vomiting the setting of idiopathic gastroparesis and concomitant hyperemesis cannabinoid syndrome.     Per chart review previous workup at [facility] 03/27/2022 [facility]-underwent abdominal surgery for PID and developed N/V with workup revealing gastric emptying prior therapy included Reglan and erythromycin and on recent admission had to use ativan and amitriptyline. Gastric emptying study 07/2021 on opioids 33% emptying at 4 hours EGD showed grade C esophagitis  In the ED, temperature 99.8 F, HR 83, RR 18, BP 148/108, SpO2 99% on room air.  WBC 15.7, hemoglobin 14.2, platelets 504.  Sodium 145, potassium 3.1, chloride 105, CO2 27, glucose 151, BUN 14, creatinine 1.09.  Agnesian 2.1, lipase 128, AST 24, ALT 20, total bilirubin 0.6.  Urinalysis was with 80 ketones, negative leukocytes, negative nitrite, few bacteria, 0-5 WBCs.  UDS positive for benzodiazepines, THC.  CT Abdo/pelvis with contrast with no bowel obstruction, no free air/free fluid, normal appendix, pancreas unremarkable without inflammatory changes.  Patient received hydromorphone 1 mg x 2, Ativan 0.5 mg x 2, Zofran 4 mg x 2, Protonix 40 mg IV once, KCl 10 mEq and NS 2.5 L bolus.  TRH consulted for admission for further evaluation management of intractable nausea/vomiting likely secondary to idiopathic gastroparesis/cannabinoid hyperemesis syndrome.   Assessment & Plan:   Intractable nausea/vomiting secondary to idiopathic gastroparesis versus cannabinoid hyperemesis syndrome Patient presenting with intractable nausea and vomiting over the last 4-5 days in addition to generalized abdominal pain.  Patient with multiple hospitalizations for the same.  CT abdomen/pelvis unrevealing.  UDS positive for THC. -- Reglan 10 mg IV every 8 hours -- Compazine/Zofran IV as needed--Dilaudid 1-2 mg every 4 hours as needed moderate/severe pain -- IVF with LR at 75 mL/h -- Clear liquid diet, will advance as tolerates  Elevated lipase Elevated lipase at 128, normal AST/ALT/ALP and bilirubin level.  CT abdomen/pelvis no acute abdominal finding. --Supportive care as above  Acute renal failure secondary to dehydration Creatinine 1.09, likely secondary to poor oral intake with intractable nausea/vomiting. --Continue IV fluid hydration -- BMP in a.m.  Hypokalemia Replete, repeat electrolytes in a.m.  Leukocytosis WBC elevated 15.7, afebrile.  No concern for active infection.  Etiology likely hemoconcentration. --Repeat CBC in a.m.  Bipolar 1 disorder Home regimen includes Lamictal 400 mg p.o. nightly, Haldol 10 mg p.o. nightly, clonazepam 1 mg p.o. twice daily -- Hold clonazepam in favor of Ativan 1 mg IV twice daily -- Continue Lamictal and Haldol  Type 2 diabetes mellitus On metformin 500 mg p.o. daily outpatient.  Hemoglobin A1c 6.6 on 05/20/2022, well-controlled. -- Hold metformin while inpatient -- SSI for coverage -- CBG before every meal/at bedtime  History of  hypothyroidism secondary to Graves' disease Currently not on medication at  home.  Morbid Obesity Body mass index is 57.78 kg/m.  Discussed with patient needs for aggressive lifestyle changes/weight loss as this complicates all facets of care.  Outpatient follow-up with PCP.    DVT prophylaxis: SCDs Start: 10/08/22 2044    Code Status: Full Code Family Communication: No family present at bedside this morning  Disposition Plan:  Level of care: Med-Surg Status is: Inpatient Remains inpatient appropriate because: IV fluids, IV antibiotics    Consultants:  None  Procedures:  None  Antimicrobials:  None   Subjective: Patient seen examined bedside, resting comfortably.  Lying in bed waiting in ED holding area.  Complaining of pain, requesting Compazine.  Also requesting IV Ativan instead of her clonazepam.  Patient reports that she is being evicted on Sunday with plan to move in with her parents in [place] thereafter.  Discussed need for complete cessation/abstinence from [facility] is likely contributing factor to her multiple hospitalizations.  No other specific questions or concerns at this time.  Denies headache, no fever/chills/night sweats, no current vomiting/diarrhea, no abdominal pain, no focal weakness, no fatigue, no chest pain/palpitations, no paresthesias.  No acute events overnight per nurse staff.  Objective: Vitals:   10/09/22 0925 10/09/22 1100 10/09/22 1309 10/09/22 1447  BP:  114/75  129/74  Pulse:  88  83  Resp:  18  16  Temp: 98.2 F (36.8 C)  98.7 F (37.1 C)   TempSrc: Oral  Oral   SpO2:  96%  99%  Weight:      Height:        Intake/Output Summary (Last 24 hours) at 10/09/2022 1521 Last data filed at 10/09/2022 1510 Gross per 24 hour  Intake 730 ml  Output --  Net 730 ml   Filed Weights   10/08/22 1102  Weight: (!) 172.4 kg    Examination:  Physical Exam: GEN: NAD, alert and oriented x 3, obese HEENT: NCAT, PERRL, EOMI, sclera clear, MMM PULM: CTAB w/o wheezes/crackles, normal respiratory effort, on room air CV: RRR w/o  M/G/R GI: abd soft, NTND, NABS, no R/G/M MSK: no peripheral edema, muscle strength globally intact 5/5 bilateral upper/lower extremities NEURO: CN II-XII intact, no focal deficits, sensation to light touch intact PSYCH: normal mood/affect Integumentary: dry/intact, no rashes or wounds    Data Reviewed: I have personally reviewed following labs and imaging studies  CBC: Recent Labs  Lab 10/08/22 1200 10/09/22 0620  WBC 15.7* 16.9*  HGB 14.2 12.9  HCT 44.1 41.6  MCV 87.0 89.8  PLT 504* 447*   Basic Metabolic Panel: Recent Labs  Lab 10/08/22 1200 10/08/22 1210 10/09/22 0620  NA 145  --  141  K 3.1*  --  3.1*  CL 105  --  106  CO2 27  --  24  GLUCOSE 151*  --  119*  BUN 14  --  11  CREATININE 1.09*  --  0.88  CALCIUM 9.6  --  8.3*  MG  --  2.1  --    GFR: Estimated Creatinine Clearance: 159.8 mL/min (by C-G formula based on SCr of 0.88 mg/dL). Liver Function Tests: Recent Labs  Lab 10/08/22 1200 10/09/22 0620  AST 24 34  ALT 20 28  ALKPHOS 93 79  BILITOT 0.6 0.9  PROT 8.6* 7.4  ALBUMIN 4.6 4.1   Recent Labs  Lab 10/08/22 1200  LIPASE 128*   No results for input(s): "AMMONIA" in the last 168 hours. Coagulation  Profile: No results for input(s): "INR", "PROTIME" in the last 168 hours. Cardiac Enzymes: No results for input(s): "CKTOTAL", "CKMB", "CKMBINDEX", "TROPONINI" in the last 168 hours. BNP (last 3 results) No results for input(s): "PROBNP" in the last 8760 hours. HbA1C: No results for input(s): "HGBA1C" in the last 72 hours. CBG: Recent Labs  Lab 10/09/22 0801 10/09/22 1157  GLUCAP 110* 155*   Lipid Profile: No results for input(s): "CHOL", "HDL", "LDLCALC", "TRIG", "CHOLHDL", "LDLDIRECT" in the last 72 hours. Thyroid Function Tests: No results for input(s): "TSH", "T4TOTAL", "FREET4", "T3FREE", "THYROIDAB" in the last 72 hours. Anemia Panel: No results for input(s): "VITAMINB12", "FOLATE", "FERRITIN", "TIBC", "IRON", "RETICCTPCT" in the  last 72 hours. Sepsis Labs: No results for input(s): "PROCALCITON", "LATICACIDVEN" in the last 168 hours.  No results found for this or any previous visit (from the past 240 hour(s)).       Radiology Studies: CT ABDOMEN PELVIS W CONTRAST  Result Date: 10/08/2022 CLINICAL DATA:  Pain.  Nausea and vomiting for 4 days. EXAM: CT ABDOMEN AND PELVIS WITH CONTRAST TECHNIQUE: Multidetector CT imaging of the abdomen and pelvis was performed using the standard protocol following bolus administration of intravenous contrast. RADIATION DOSE REDUCTION: This exam was performed according to the departmental dose-optimization program which includes automated exposure control, adjustment of the mA and/or kV according to patient size and/or use of iterative reconstruction technique. CONTRAST:  OMNIPAQUE IOHEXOL 300 MG/ML  SOLN COMPARISON:  CT 07/24/2021 and older. HIDA scan 05/21/2022. Gastric emptying study 2023 May FINDINGS: Lower chest: Lung bases are grossly clear.  No pleural effusion. Hepatobiliary: No focal liver abnormality is seen. No gallstones, gallbladder wall thickening, or biliary dilatation. Patent portal vein. Pancreas: Unremarkable. No pancreatic ductal dilatation or surrounding inflammatory changes. Spleen: Normal in size without focal abnormality. Adrenals/Urinary Tract: Adrenal glands are unremarkable. Kidneys are normal, without renal calculi, focal lesion, or hydronephrosis. Bladder is unremarkable. Stomach/Bowel: Stomach is within normal limits. Appendix appears normal. No evidence of bowel wall thickening, distention, or inflammatory changes. Vascular/Lymphatic: No significant vascular findings are present. No enlarged abdominal or pelvic lymph nodes. Reproductive: Uterus and bilateral adnexa are unremarkable. Other: No abdominal wall hernia or abnormality. No abdominopelvic ascites. Musculoskeletal: No acute or significant osseous findings. IMPRESSION: No bowel obstruction, free air or free  fluid.  Normal appendix. Electronically Signed   By: Karen Kays M.D.   On: 10/08/2022 18:12        Scheduled Meds:  enoxaparin (LOVENOX) injection  80 mg Subcutaneous Q24H   gabapentin  300 mg Oral TID   haloperidol  10 mg Oral QHS   insulin aspart  0-5 Units Subcutaneous QHS   insulin aspart  0-6 Units Subcutaneous TID WC   lamoTRIgine  400 mg Oral QHS   LORazepam  1 mg Intravenous BID   metoCLOPramide (REGLAN) injection  10 mg Intravenous Q8H   pantoprazole  40 mg Oral Daily   sodium chloride flush  3 mL Intravenous Q12H   Continuous Infusions:  sodium chloride     lactated ringers 1,000 mL with potassium chloride 20 mEq/L Pediatric IV infusion     potassium chloride 10 mEq (10/09/22 1510)     LOS: 0 days    Time spent: 53 minutes spent on chart review, discussion with nursing staff, consultants, updating family and interview/physical exam; more than 50% of that time was spent in counseling and/or coordination of care.    Alvira Philips [place], DO [facility] Available via Epic secure chat 7am-7pm After these hours,  please refer to coverage provider listed on [email or web address] 10/09/2022, 3:21 PM

## 2022-10-09 NOTE — Plan of Care (Signed)
  Problem: Metabolic: Goal: Ability to maintain appropriate glucose levels will improve Outcome: Progressing   Problem: Nutritional: Goal: Maintenance of adequate nutrition will improve Outcome: Progressing   Problem: Skin Integrity: Goal: Risk for impaired skin integrity will decrease Outcome: Progressing   Problem: Tissue Perfusion: Goal: Adequacy of tissue perfusion will improve Outcome: Progressing   

## 2022-10-10 DIAGNOSIS — F12188 Cannabis abuse with other cannabis-induced disorder: Secondary | ICD-10-CM | POA: Diagnosis not present

## 2022-10-10 LAB — GLUCOSE, CAPILLARY
Glucose-Capillary: 103 mg/dL — ABNORMAL HIGH (ref 70–99)
Glucose-Capillary: 113 mg/dL — ABNORMAL HIGH (ref 70–99)
Glucose-Capillary: 90 mg/dL (ref 70–99)
Glucose-Capillary: 102 mg/dL — ABNORMAL HIGH (ref 70–99)

## 2022-10-10 MED ORDER — HYDROMORPHONE HCL 1 MG/ML IJ SOLN
1.0000 mg | INTRAMUSCULAR | Status: DC | PRN
  Administered 2022-10-11 (×3): 2 mg via INTRAVENOUS
  Filled 2022-10-10 (×3): qty 2

## 2022-10-10 MED ORDER — ORAL CARE MOUTH RINSE: 15.0000 mL | OROMUCOSAL | Status: DC | PRN

## 2022-10-10 MED ORDER — IBUPROFEN 400 MG PO TABS
400.0000 mg | ORAL_TABLET | Freq: Four times a day (QID) | ORAL | Status: DC | PRN
  Administered 2022-10-10 – 2022-10-11 (×2): 400 mg via ORAL
  Filled 2022-10-10 (×2): qty 1

## 2022-10-10 MED ORDER — POTASSIUM CHLORIDE 10 MEQ/100ML IV SOLN
10.0000 meq | INTRAVENOUS | Status: AC
  Administered 2022-10-10 (×6): 10 meq via INTRAVENOUS
  Filled 2022-10-10 (×6): qty 100

## 2022-10-10 MED ORDER — ZOLPIDEM TARTRATE 5 MG PO TABS
5.0000 mg | ORAL_TABLET | Freq: Once | ORAL | Status: AC
  Administered 2022-10-10: 5 mg via ORAL
  Filled 2022-10-10: qty 1

## 2022-10-10 NOTE — Plan of Care (Signed)
  Problem: Education: Goal: Ability to describe self-care measures that may prevent or decrease complications (Diabetes Survival Skills Education) will improve Outcome: Progressing   Problem: Coping: Goal: Ability to adjust to condition or change in health will improve Outcome: Progressing   Problem: Fluid Volume: Goal: Ability to maintain a balanced intake and output will improve Outcome: Progressing   

## 2022-10-10 NOTE — Plan of Care (Signed)
  Problem: Education: Goal: Ability to describe self-care measures that may prevent or decrease complications (Diabetes Survival Skills Education) will improve Outcome: Progressing   Problem: Coping: Goal: Ability to adjust to condition or change in health will improve Outcome: Progressing   Problem: Clinical Measurements: Goal: Will remain free from infection Outcome: Not Progressing   Problem: Clinical Measurements: Goal: Diagnostic test results will improve Outcome: Not Progressing

## 2022-10-10 NOTE — TOC Initial Note (Signed)
Transition of Care [facility]) - Initial/Assessment Note    Patient Details  Name: Deborah Kelly MRN: [id number] Date of Birth: September 26, 1990  Transition of Care [facility]) CM/SW Contact:    Adrian Prows, RN Phone Number: 10/10/2022, 2:33 PM  Clinical Narrative:                 [facility] consult for SA , no PCP listed, and SDOH risks; spoke w/pt in room; she identified POC Freddie Breech (sister) [phone]; pt denies IPV and food insecurity; pt says she receives food stamps; pt said she has been evicted from her apt and she is moving to [place] to live w/her parents; pt says her sister is currently packing up her apt; she has transportation; she says her PCP is with [facility]; she verified insurance; pt says she does not have DME, HH service or home oxygen; pt declined resources for [facility] counseling/education pt says she would like resources to apply for Medicaid; pt given copy of resource for [facility]; resource also added to d/c instructions; she will contact the agency; no TOC needs.  Expected Discharge Plan: Home/Self Care Barriers to Discharge: Continued Medical Work up   Patient Goals and CMS Choice Patient states their goals for this hospitalization and ongoing recovery are:: home w/ parents          Expected Discharge Plan and Services   Discharge Planning Services: CM Consult   Living arrangements for the past 2 months: Apartment                                      Prior Living Arrangements/Services Living arrangements for the past 2 months: Apartment Lives with:: Self Patient language and need for interpreter reviewed:: Yes Do you feel safe going back to the place where you live?: Yes      Need for Family Participation in Patient Care: Yes (Comment) Care giver support system in place?: Yes (comment) Current home services:  (n/a) Criminal Activity/Legal Involvement Pertinent to Current Situation/Hospitalization: No - Comment as needed  Activities  of Daily Living Home Assistive Devices/Equipment: None ADL Screening (condition at time of admission) Patient's cognitive ability adequate to safely complete daily activities?: No Is the patient deaf or have difficulty hearing?: No Does the patient have difficulty seeing, even when wearing glasses/contacts?: No Does the patient have difficulty concentrating, remembering, or making decisions?: No Patient able to express need for assistance with ADLs?: No Does the patient have difficulty dressing or bathing?: No Independently performs ADLs?: Yes (appropriate for developmental age) Does the patient have difficulty walking or climbing stairs?: No Weakness of Legs: None Weakness of Arms/Hands: None  Permission Sought/Granted Permission sought to share information with : Case Manager Permission granted to share information with : Yes, Verbal Permission Granted  Share Information with NAME: Case Manager        Permission granted to share info w Contact Information: Roddie Mc (sister) [phone]  Emotional Assessment Appearance:: Appears stated age Attitude/Demeanor/Rapport: Gracious Affect (typically observed): Accepting Orientation: : Oriented to Self, Oriented to Place, Oriented to  Time, Oriented to Situation Alcohol / Substance Use: Illicit Drugs Psych Involvement: No (comment)  Admission diagnosis:  Gastroparesis [K31.84] Pain of upper abdomen [R10.10] Intractable cyclical vomiting with nausea [R11.15] Cannabis hyperemesis syndrome concurrent with and due to cannabis abuse [facility]) [F12.188] Patient Active Problem List   Diagnosis Date Noted   Intractable cyclical vomiting with nausea 10/09/2022  Intractable nausea and vomiting 10/08/2022   Hypothyroidism 10/08/2022   Cannabis hyperemesis syndrome concurrent with and due to cannabis abuse (HCC) 10/08/2022   AKI (acute kidney injury) (HCC) 10/08/2022   Nausea & vomiting 05/17/2022   Elevated lipase 07/25/2021    Cannabinoid hyperemesis syndrome 07/17/2021   Abdominal pain 07/15/2021   Hypocalcemia 07/15/2021   Hypermagnesemia 07/15/2021   Intractable vomiting 04/27/2021   Idiopathic gastroparesis 04/26/2021   Marijuana abuse 04/26/2021   Bipolar disorder (HCC) 04/26/2021   Hypokalemia 04/26/2021   Cannabis misuse 03/15/2021   Elevated prolactin level 03/15/2021   Irritable bowel syndrome 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 03/15/2021   Chronic pelvic pain in female 07/08/2020   Dysmenorrhea 07/08/2020   Alcohol abuse 05/19/2017   History of suicide attempt 05/18/2017   Suicidal ideations 05/18/2017   Thrombocytosis 04/17/2016   Obesity, Class III, BMI 40-49.9 (morbid obesity) (HCC) 01/11/2016   Leukocytosis 12/02/2015   Anxiety 12/02/2015   S/P laparoscopy 10/27/2015   Bipolar affective disorder, depressed (HCC) 01/20/2012   Graves disease 08/25/2011   Hyperthyroidism 08/20/2011   PCP:  Oneita Hurt, No Pharmacy:   [facility] [id number] - [place], [place] - [address] [place] [place] [place] [address] Phone: [phone] Fax: [phone]  [facility] [address] [place] - [address] [place] [address] Phone: [phone] Fax: [phone]     Social Determinants of Health (SDOH) Social History: SDOH Screenings   Food Insecurity: No Food Insecurity (10/10/2022)  Recent Concern: Food Insecurity - Food Insecurity Present (10/09/2022)  Housing: Medium Risk (10/10/2022)  Transportation Needs: No Transportation Needs (10/10/2022)  Utilities: Not At Risk (10/10/2022)  Recent Concern: Utilities - At Risk (10/09/2022)  Financial Resource Strain: High Risk (09/17/2022)   Received from [facility]  Physical Activity: Inactive (09/17/2022)   Received from [facility]  Social Connections: Moderately Integrated (09/17/2022)   [facility]  Stress: Stress Concern Present (09/17/2022)   Received from [facility]  Tobacco Use: Medium  Risk (10/08/2022)   SDOH Interventions: Food Insecurity Interventions: Intervention Not Indicated, Inpatient TOC Housing Interventions: Inpatient TOC, Other (Comment) (pt said she has been evicted from her apt and she is moving to [place] to live w/her parents) Transportation Interventions: Intervention Not Indicated, Inpatient TOC Utilities Interventions: Intervention Not Indicated, Inpatient TOC   Readmission Risk Interventions    10/10/2022    2:30 PM 05/19/2022    9:04 AM 07/30/2021    3:01 PM  Readmission Risk Prevention Plan  Transportation Screening Complete Complete Complete  PCP or Specialist Appt within 5-7 Days Complete Complete   PCP or Specialist Appt within 3-5 Days   Not Complete  Not Complete comments   Pt needs to establish new PCP in [place]. Has insurance  Home Care Screening Complete Complete   Medication Review (RN CM) Complete Complete   HRI or Home Care Consult   Complete  Social Work Consult for Recovery Care Planning/Counseling   Not Complete  SW consult not completed comments   NA  Palliative Care Screening   Not Applicable  Medication Review Oceanographer)   Complete

## 2022-10-10 NOTE — Progress Notes (Signed)
PROGRESS NOTE    Deborah Kelly  ZOX:[id number] DOB: [birth year] DOA: 10/08/2022 PCP: Pcp, No    Brief Narrative:   Deborah Kelly is a 31 y.o. female with past medical history significant for bipolar disorder type I, idiopathic gastroparesis, chronic THC abuse, hypothyroidism secondary to Graves' disease, chronic pain syndrome, morbid obesity with multiple ED visits/hospitalizations for similar complaints who once again presented to [facility] ED on 8/1 complaining of nausea/vomiting over the last 4-5 days.  Reports unable to tolerate oral intake.  She reports this reoccurs every 2-3 weeks.  She reports has lost her job, currently undergoing eviction with plan to move in with her parents in [place].  Denies any sick contacts, no changes in her medications but does endorse continued marijuana use.  Per chart review patient has previous hospital admission in March 2024 due to intractable nausea vomiting the setting of idiopathic gastroparesis and concomitant hyperemesis cannabinoid syndrome.     Per chart review previous workup at [facility] 03/27/2022 [facility]-underwent abdominal surgery for PID and developed N/V with workup revealing gastric emptying prior therapy included Reglan and erythromycin and on recent admission had to use ativan and amitriptyline. Gastric emptying study 07/2021 on opioids 33% emptying at 4 hours EGD showed grade C esophagitis  In the ED, temperature 99.8 F, HR 83, RR 18, BP 148/108, SpO2 99% on room air.  WBC 15.7, hemoglobin 14.2, platelets 504.  Sodium 145, potassium 3.1, chloride 105, CO2 27, glucose 151, BUN 14, creatinine 1.09.  Agnesian 2.1, lipase 128, AST 24, ALT 20, total bilirubin 0.6.  Urinalysis was with 80 ketones, negative leukocytes, negative nitrite, few bacteria, 0-5 WBCs.  UDS positive for benzodiazepines, THC.  CT Abdo/pelvis with contrast with no bowel obstruction, no free air/free fluid, normal appendix, pancreas unremarkable without inflammatory changes.  Patient received hydromorphone 1 mg x 2, Ativan 0.5 mg x 2, Zofran 4 mg x 2, Protonix 40 mg IV once, KCl 10 mEq and NS 2.5 L bolus.  TRH consulted for admission for further evaluation management of intractable nausea/vomiting likely secondary to idiopathic gastroparesis/cannabinoid hyperemesis syndrome.   Assessment & Plan:   Intractable nausea/vomiting secondary to idiopathic gastroparesis versus cannabinoid hyperemesis syndrome Patient presenting with intractable nausea and vomiting over the last 4-5 days in addition to generalized abdominal pain.  Patient with multiple hospitalizations for the same.  CT abdomen/pelvis unrevealing.  UDS positive for THC. -- Reglan 10 mg IV every 8 hours -- Compazine/Zofran IV as needed -- Dilaudid 1-2 mg every 4 hours as needed moderate/severe pain -- IVF with LR at 75 mL/h -- Clear liquid diet, will advance as tolerates  Elevated lipase Elevated lipase at 128, normal AST/ALT/ALP and bilirubin level.  CT abdomen/pelvis no acute abdominal finding. --Supportive care as above  Acute renal failure secondary to dehydration: Resolved Creatinine 1.09, likely secondary to poor oral intake with intractable nausea/vomiting. -- Cr 1.09>0.74 --Continue IV fluid hydration -- BMP in a.m.  Hypokalemia Potassium 3.1, replete -- repeat electrolytes in a.m.  Leukocytosis: Resolved WBC elevated 15.7, afebrile.  No concern for active infection.  Etiology likely hemoconcentration. -- WBC 15.7>10.3  Bipolar 1 disorder Home regimen includes Lamictal 400 mg p.o. nightly, Haldol 10 mg p.o. nightly, clonazepam 1 mg p.o. twice daily -- Hold clonazepam in favor of Ativan 1 mg IV twice daily -- Continue Lamictal and Haldol  Type 2 diabetes mellitus On metformin 500 mg p.o. daily outpatient.  Hemoglobin A1c 6.6 on 05/20/2022, well-controlled. -- Hold metformin while inpatient -- SSI for coverage --  CBG before every meal/at bedtime  History of hypothyroidism secondary to  Graves' disease Currently not on medication at home.  TSH 0.942, free T41.31.  Morbid Obesity Body mass index is 57.78 kg/m.  Discussed with patient needs for aggressive lifestyle changes/weight loss as this complicates all facets of care.  Outpatient follow-up with PCP.    DVT prophylaxis: SCDs Start: 10/08/22 2044    Code Status: Full Code Family Communication: No family present at bedside this morning  Disposition Plan:  Level of care: Med-Surg Status is: Inpatient Remains inpatient appropriate because: IV fluids, IV antibiotics    Consultants:  None  Procedures:  None  Antimicrobials:  None   Subjective: Patient seen examined bedside, resting comfortably.  Lying in bed.  Tearful that she continues to have recurrence of her intractable nausea/vomiting.  Discussed with her that she has severe gastroparesis that is not helped by her continued abuse of THC.  Long discussion with patient at bedside regarding cessation/abstinence as this is large contributing factor to her recurrent hospitalizations.  Patient also tearful regarding that she is being evicted and lost her job with plans to move to [place] to live with her parents. No other specific questions or concerns at this time.  Denies headache, no fever/chills/night sweats, no current vomiting/diarrhea, no abdominal pain, no focal weakness, no fatigue, no chest pain/palpitations, no paresthesias.  No acute events overnight per nurse staff.  Objective: Vitals:   10/09/22 2038 10/10/22 0022 10/10/22 0617 10/10/22 0943  BP: 126/78 128/70 137/81 (!) 143/63  Pulse: (!) 107 90 88 91  Resp: 19 18 18 16   Temp: 98.1 F (36.7 C) 98.3 F (36.8 C) 98.2 F (36.8 C) 98.3 F (36.8 C)  TempSrc: Oral Oral Oral Oral  SpO2: 97% 98% 98% 98%  Weight:      Height:        Intake/Output Summary (Last 24 hours) at 10/10/2022 1346 Last data filed at 10/10/2022 1000 Gross per 24 hour  Intake 1696.09 ml  Output 0 ml  Net 1696.09 ml    Filed Weights   10/08/22 1102  Weight: (!) 172.4 kg    Examination:  Physical Exam: GEN: NAD, alert and oriented x 3, obese HEENT: NCAT, PERRL, EOMI, sclera clear, MMM PULM: CTAB w/o wheezes/crackles, normal respiratory effort, on room air CV: RRR w/o M/G/R GI: abd soft, NTND, NABS, no R/G/M MSK: no peripheral edema, muscle strength globally intact 5/5 bilateral upper/lower extremities NEURO: CN II-XII intact, no focal deficits, sensation to light touch intact PSYCH: normal mood/affect Integumentary: dry/intact, no rashes or wounds    Data Reviewed: I have personally reviewed following labs and imaging studies  CBC: Recent Labs  Lab 10/08/22 1200 10/09/22 0620 10/10/22 0550  WBC 15.7* 16.9* 10.3  HGB 14.2 12.9 11.9*  HCT 44.1 41.6 37.6  MCV 87.0 89.8 89.5  PLT 504* 447* 373   Basic Metabolic Panel: Recent Labs  Lab 10/08/22 1200 10/08/22 1210 10/09/22 0620 10/10/22 0550  NA 145  --  141 140  K 3.1*  --  3.1* 3.1*  CL 105  --  106 103  CO2 27  --  24 24  GLUCOSE 151*  --  119* 107*  BUN 14  --  11 6  CREATININE 1.09*  --  0.88 0.74  CALCIUM 9.6  --  8.3* 8.0*  MG  --  2.1  --  2.1  PHOS  --   --   --  2.6   GFR: Estimated Creatinine Clearance: 175.8  mL/min (by C-G formula based on SCr of 0.74 mg/dL). Liver Function Tests: Recent Labs  Lab 10/08/22 1200 10/09/22 0620  AST 24 34  ALT 20 28  ALKPHOS 93 79  BILITOT 0.6 0.9  PROT 8.6* 7.4  ALBUMIN 4.6 4.1   Recent Labs  Lab 10/08/22 1200  LIPASE 128*   No results for input(s): "AMMONIA" in the last 168 hours. Coagulation Profile: No results for input(s): "INR", "PROTIME" in the last 168 hours. Cardiac Enzymes: No results for input(s): "CKTOTAL", "CKMB", "CKMBINDEX", "TROPONINI" in the last 168 hours. BNP (last 3 results) No results for input(s): "PROBNP" in the last 8760 hours. HbA1C: No results for input(s): "HGBA1C" in the last 72 hours. CBG: Recent Labs  Lab 10/09/22 1157  10/09/22 1657 10/09/22 2140 10/10/22 0735 10/10/22 1139  GLUCAP 155* 107* 99 103* 113*   Lipid Profile: No results for input(s): "CHOL", "HDL", "LDLCALC", "TRIG", "CHOLHDL", "LDLDIRECT" in the last 72 hours. Thyroid Function Tests: Recent Labs    10/10/22 0550  TSH 0.942  FREET4 1.31*   Anemia Panel: No results for input(s): "VITAMINB12", "FOLATE", "FERRITIN", "TIBC", "IRON", "RETICCTPCT" in the last 72 hours. Sepsis Labs: No results for input(s): "PROCALCITON", "LATICACIDVEN" in the last 168 hours.  No results found for this or any previous visit (from the past 240 hour(s)).       Radiology Studies: CT ABDOMEN PELVIS W CONTRAST  Result Date: 10/08/2022 CLINICAL DATA:  Pain.  Nausea and vomiting for 4 days. EXAM: CT ABDOMEN AND PELVIS WITH CONTRAST TECHNIQUE: Multidetector CT imaging of the abdomen and pelvis was performed using the standard protocol following bolus administration of intravenous contrast. RADIATION DOSE REDUCTION: This exam was performed according to the departmental dose-optimization program which includes automated exposure control, adjustment of the mA and/or kV according to patient size and/or use of iterative reconstruction technique. CONTRAST:  OMNIPAQUE IOHEXOL 300 MG/ML  SOLN COMPARISON:  CT 07/24/2021 and older. HIDA scan 05/21/2022. Gastric emptying study 2023 May FINDINGS: Lower chest: Lung bases are grossly clear.  No pleural effusion. Hepatobiliary: No focal liver abnormality is seen. No gallstones, gallbladder wall thickening, or biliary dilatation. Patent portal vein. Pancreas: Unremarkable. No pancreatic ductal dilatation or surrounding inflammatory changes. Spleen: Normal in size without focal abnormality. Adrenals/Urinary Tract: Adrenal glands are unremarkable. Kidneys are normal, without renal calculi, focal lesion, or hydronephrosis. Bladder is unremarkable. Stomach/Bowel: Stomach is within normal limits. Appendix appears normal. No evidence of  bowel wall thickening, distention, or inflammatory changes. Vascular/Lymphatic: No significant vascular findings are present. No enlarged abdominal or pelvic lymph nodes. Reproductive: Uterus and bilateral adnexa are unremarkable. Other: No abdominal wall hernia or abnormality. No abdominopelvic ascites. Musculoskeletal: No acute or significant osseous findings. IMPRESSION: No bowel obstruction, free air or free fluid.  Normal appendix. Electronically Signed   By: Karen Kays M.D.   On: 10/08/2022 18:12        Scheduled Meds:  enoxaparin (LOVENOX) injection  80 mg Subcutaneous Q24H   gabapentin  300 mg Oral TID   haloperidol  10 mg Oral QHS   insulin aspart  0-5 Units Subcutaneous QHS   insulin aspart  0-6 Units Subcutaneous TID WC   lamoTRIgine  400 mg Oral QHS   LORazepam  1 mg Intravenous BID   metoCLOPramide (REGLAN) injection  10 mg Intravenous Q8H   pantoprazole  40 mg Oral Daily   sodium chloride flush  3 mL Intravenous Q12H   Continuous Infusions:  sodium chloride     lactated ringers 1,000  mL with potassium chloride 20 mEq/L Pediatric IV infusion 75 mL/hr at 10/10/22 1100   potassium chloride 10 mEq (10/10/22 1310)     LOS: 1 day    Time spent: 53 minutes spent on chart review, discussion with nursing staff, consultants, updating family and interview/physical exam; more than 50% of that time was spent in counseling and/or coordination of care.    Alvira Philips [place], DO [facility] Available via Epic secure chat 7am-7pm After these hours, please refer to coverage provider listed on [email or web address] 10/10/2022, 1:46 PM

## 2022-10-11 DIAGNOSIS — F12188 Cannabis abuse with other cannabis-induced disorder: Secondary | ICD-10-CM | POA: Diagnosis not present

## 2022-10-11 LAB — GLUCOSE, CAPILLARY
Glucose-Capillary: 109 mg/dL — ABNORMAL HIGH (ref 70–99)
Glucose-Capillary: 131 mg/dL — ABNORMAL HIGH (ref 70–99)
Glucose-Capillary: 89 mg/dL (ref 70–99)
Glucose-Capillary: 79 mg/dL (ref 70–99)

## 2022-10-11 LAB — BASIC METABOLIC PANEL WITH GFR
Anion gap: 10 (ref 5–15)
BUN: <5 mg/dL — ABNORMAL LOW (ref 6–20)
CO2: 25 mmol/L (ref 22–32)
Calcium: 8.1 mg/dL — ABNORMAL LOW (ref 8.9–10.3)
Chloride: 100 mmol/L (ref 98–111)
Creatinine, Ser: 0.65 mg/dL (ref 0.44–1.00)
GFR, Estimated: >60 mL/min (ref >60)
Glucose, Bld: 108 mg/dL — ABNORMAL HIGH (ref 70–99)
Potassium: 2.9 mmol/L — ABNORMAL LOW (ref 3.5–5.1)
Sodium: 135 mmol/L (ref 135–145)

## 2022-10-11 LAB — MAGNESIUM: Magnesium: 1.9 mg/dL (ref 1.7–2.4)

## 2022-10-11 MED ORDER — KETOROLAC TROMETHAMINE 15 MG/ML IJ SOLN
15.0000 mg | Freq: Four times a day (QID) | INTRAMUSCULAR | Status: DC | PRN
  Administered 2022-10-11 – 2022-10-14 (×7): 15 mg via INTRAVENOUS
  Filled 2022-10-11 (×8): qty 1

## 2022-10-11 MED ORDER — ZOLPIDEM TARTRATE 5 MG PO TABS
5.0000 mg | ORAL_TABLET | Freq: Every day | ORAL | Status: DC
  Administered 2022-10-11 – 2022-10-13 (×3): 5 mg via ORAL
  Filled 2022-10-11 (×3): qty 1

## 2022-10-11 MED ORDER — POTASSIUM CHLORIDE CRYS ER 20 MEQ PO TBCR
40.0000 meq | EXTENDED_RELEASE_TABLET | Freq: Once | ORAL | Status: AC
  Administered 2022-10-11: 40 meq via ORAL
  Filled 2022-10-11: qty 2

## 2022-10-11 MED ORDER — POTASSIUM CHLORIDE IN NACL 40-0.9 MEQ/L-% IV SOLN
INTRAVENOUS | Status: DC
  Filled 2022-10-11 (×10): qty 1000

## 2022-10-11 MED ORDER — MAGNESIUM SULFATE 2 GM/50ML IV SOLN
2.0000 g | Freq: Once | INTRAVENOUS | Status: AC
  Administered 2022-10-11: 2 g via INTRAVENOUS
  Filled 2022-10-11: qty 50

## 2022-10-11 MED ORDER — POTASSIUM CHLORIDE 10 MEQ/100ML IV SOLN
10.0000 meq | INTRAVENOUS | Status: AC
  Administered 2022-10-11 (×6): 10 meq via INTRAVENOUS
  Filled 2022-10-11 (×6): qty 100

## 2022-10-11 MED ORDER — HYDROMORPHONE HCL 1 MG/ML IJ SOLN
1.0000 mg | INTRAMUSCULAR | Status: DC | PRN
  Administered 2022-10-11 – 2022-10-12 (×6): 2 mg via INTRAVENOUS
  Filled 2022-10-11 (×6): qty 2

## 2022-10-11 NOTE — Progress Notes (Signed)
PROGRESS NOTE    Deborah Kelly  KVQ:[id number] DOB: January 15, 1991 DOA: 10/08/2022 PCP: Pcp, No    Brief Narrative:   Deborah Kelly is a 32 y.o. female with past medical history significant for bipolar disorder type I, idiopathic gastroparesis, chronic THC abuse, hypothyroidism secondary to Graves' disease, chronic pain syndrome, morbid obesity with multiple ED visits/hospitalizations for similar complaints who once again presented to [facility] ED on 8/1 complaining of nausea/vomiting over the last 4-5 days.  Reports unable to tolerate oral intake.  She reports this reoccurs every 2-3 weeks.  She reports has lost her job, currently undergoing eviction with plan to move in with her parents in [place].  Denies any sick contacts, no changes in her medications but does endorse continued marijuana use.  Per chart review patient has previous hospital admission in March 2024 due to intractable nausea vomiting the setting of idiopathic gastroparesis and concomitant hyperemesis cannabinoid syndrome.     Per chart review previous workup at [facility] 03/27/2022 [facility]-underwent abdominal surgery for PID and developed N/V with workup revealing gastric emptying prior therapy included Reglan and erythromycin and on recent admission had to use ativan and amitriptyline. Gastric emptying study 07/2021 on opioids 33% emptying at 4 hours EGD showed grade C esophagitis  In the ED, temperature 99.8 F, HR 83, RR 18, BP 148/108, SpO2 99% on room air.  WBC 15.7, hemoglobin 14.2, platelets 504.  Sodium 145, potassium 3.1, chloride 105, CO2 27, glucose 151, BUN 14, creatinine 1.09.  Agnesian 2.1, lipase 128, AST 24, ALT 20, total bilirubin 0.6.  Urinalysis was with 80 ketones, negative leukocytes, negative nitrite, few bacteria, 0-5 WBCs.  UDS positive for benzodiazepines, THC.  CT Abdo/pelvis with contrast with no bowel obstruction, no free air/free fluid, normal appendix, pancreas unremarkable without inflammatory changes.  Patient received hydromorphone 1 mg x 2, Ativan 0.5 mg x 2, Zofran 4 mg x 2, Protonix 40 mg IV once, KCl 10 mEq and NS 2.5 L bolus.  TRH consulted for admission for further evaluation management of intractable nausea/vomiting likely secondary to idiopathic gastroparesis/cannabinoid hyperemesis syndrome.   Assessment & Plan:   Intractable nausea/vomiting secondary to idiopathic gastroparesis versus cannabinoid hyperemesis syndrome Patient presenting with intractable nausea and vomiting over the last 4-5 days in addition to generalized abdominal pain.  Patient with multiple hospitalizations for the same.  CT abdomen/pelvis unrevealing.  UDS positive for THC. -- Reglan 10 mg IV every 8 hours -- Compazine/Zofran IV as needed -- Toradol IV PRN -- K-pad -- Dilaudid 1-2 mg every 4 hours as needed moderate/severe pain -- IVF with LR at 75 mL/h -- Clear liquid diet, will advance as tolerates  Elevated lipase Elevated lipase at 128, normal AST/ALT/ALP and bilirubin level.  CT abdomen/pelvis no acute abdominal finding. --Supportive care as above  Acute renal failure secondary to dehydration: Resolved Creatinine 1.09, likely secondary to poor oral intake with intractable nausea/vomiting. -- Cr 1.09>0.74 --Continue IV fluid hydration -- BMP in a.m.  Hypokalemia Potassium 2.9, replete -- repeat electrolytes in a.m.  Leukocytosis: Resolved WBC elevated 15.7, afebrile.  No concern for active infection.  Etiology likely hemoconcentration. -- WBC 15.7>10.3  Bipolar 1 disorder Home regimen includes Lamictal 400 mg p.o. nightly, Haldol 10 mg p.o. nightly, clonazepam 1 mg p.o. twice daily -- Hold clonazepam in favor of Ativan 1 mg IV twice daily for now -- Continue Lamictal and Haldol  Type 2 diabetes mellitus On metformin 500 mg p.o. daily outpatient.  Hemoglobin A1c 6.6 on 05/20/2022, well-controlled. --  Hold metformin while inpatient -- SSI for coverage -- CBG before every meal/at  bedtime  History of hypothyroidism secondary to Graves' disease Currently not on medication at home.  TSH 0.942, free T41.31.  Insomnia -- Ambien 5 mg p.o. nightly  Morbid Obesity Body mass index is 59.26 kg/m.  Discussed with patient needs for aggressive lifestyle changes/weight loss as this complicates all facets of care.  Outpatient follow-up with PCP.    DVT prophylaxis: SCDs Start: 10/08/22 2044    Code Status: Full Code Family Communication: No family present at bedside this morning  Disposition Plan:  Level of care: Med-Surg Status is: Inpatient Remains inpatient appropriate because: IV fluids, IV antibiotics    Consultants:  None  Procedures:  None  Antimicrobials:  None   Subjective: Patient seen examined bedside, resting comfortably.  Lying in bed.  States nausea is improved, will try to eat/drink more this morning.  Discussed if tolerates can further advance to full liquid diet today.  No other specific questions or concerns at this time.  Denies headache, no fever/chills/night sweats, no current vomiting/diarrhea, no abdominal pain, no focal weakness, no fatigue, no chest pain/palpitations, no paresthesias.  No acute events overnight per nurse staff.  Objective: Vitals:   10/10/22 1408 10/10/22 2120 10/11/22 0500 10/11/22 0557  BP: 133/79 138/74  131/63  Pulse: 96 86  93  Resp: 14 18  18   Temp: 98.3 F (36.8 C) 98.2 F (36.8 C)  97.9 F (36.6 C)  TempSrc: Oral Oral  Oral  SpO2: 100% 96%  97%  Weight:   (!) 176.8 kg   Height:        Intake/Output Summary (Last 24 hours) at 10/11/2022 1110 Last data filed at 10/11/2022 0755 Gross per 24 hour  Intake 2464.72 ml  Output 1600 ml  Net 864.72 ml   Filed Weights   10/08/22 1102 10/11/22 0500  Weight: (!) 172.4 kg (!) 176.8 kg    Examination:  Physical Exam: GEN: NAD, alert and oriented x 3, obese HEENT: NCAT, PERRL, EOMI, sclera clear, MMM PULM: CTAB w/o wheezes/crackles, normal respiratory effort,  on room air CV: RRR w/o M/G/R GI: abd soft, NTND, NABS, no R/G/M MSK: no peripheral edema, muscle strength globally intact 5/5 bilateral upper/lower extremities NEURO: CN II-XII intact, no focal deficits, sensation to light touch intact PSYCH: normal mood/affect Integumentary: dry/intact, no rashes or wounds    Data Reviewed: I have personally reviewed following labs and imaging studies  CBC: Recent Labs  Lab 10/08/22 1200 10/09/22 0620 10/10/22 0550  WBC 15.7* 16.9* 10.3  HGB 14.2 12.9 11.9*  HCT 44.1 41.6 37.6  MCV 87.0 89.8 89.5  PLT 504* 447* 373   Basic Metabolic Panel: Recent Labs  Lab 10/08/22 1200 10/08/22 1210 10/09/22 0620 10/10/22 0550 10/11/22 0532  NA 145  --  141 140 135  K 3.1*  --  3.1* 3.1* 2.9*  CL 105  --  106 103 100  CO2 27  --  24 24 25   GLUCOSE 151*  --  119* 107* 108*  BUN 14  --  11 6 <5*  CREATININE 1.09*  --  0.88 0.74 0.65  CALCIUM 9.6  --  8.3* 8.0* 8.1*  MG  --  2.1  --  2.1 1.9  PHOS  --   --   --  2.6  --    GFR: Estimated Creatinine Clearance: 178.7 mL/min (by C-G formula based on SCr of 0.65 mg/dL). Liver Function Tests: Recent Labs  Lab  10/08/22 1200 10/09/22 0620  AST 24 34  ALT 20 28  ALKPHOS 93 79  BILITOT 0.6 0.9  PROT 8.6* 7.4  ALBUMIN 4.6 4.1   Recent Labs  Lab 10/08/22 1200  LIPASE 128*   No results for input(s): "AMMONIA" in the last 168 hours. Coagulation Profile: No results for input(s): "INR", "PROTIME" in the last 168 hours. Cardiac Enzymes: No results for input(s): "CKTOTAL", "CKMB", "CKMBINDEX", "TROPONINI" in the last 168 hours. BNP (last 3 results) No results for input(s): "PROBNP" in the last 8760 hours. HbA1C: No results for input(s): "HGBA1C" in the last 72 hours. CBG: Recent Labs  Lab 10/10/22 0735 10/10/22 1139 10/10/22 1617 10/10/22 2122 10/11/22 0755  GLUCAP 103* 113* 90 102* 109*   Lipid Profile: No results for input(s): "CHOL", "HDL", "LDLCALC", "TRIG", "CHOLHDL", "LDLDIRECT"  in the last 72 hours. Thyroid Function Tests: Recent Labs    10/10/22 0550  TSH 0.942  FREET4 1.31*   Anemia Panel: No results for input(s): "VITAMINB12", "FOLATE", "FERRITIN", "TIBC", "IRON", "RETICCTPCT" in the last 72 hours. Sepsis Labs: No results for input(s): "PROCALCITON", "LATICACIDVEN" in the last 168 hours.  No results found for this or any previous visit (from the past 240 hour(s)).       Radiology Studies: No results found.      Scheduled Meds:  enoxaparin (LOVENOX) injection  80 mg Subcutaneous Q24H   gabapentin  300 mg Oral TID   haloperidol  10 mg Oral QHS   insulin aspart  0-5 Units Subcutaneous QHS   insulin aspart  0-6 Units Subcutaneous TID WC   lamoTRIgine  400 mg Oral QHS   LORazepam  1 mg Intravenous BID   metoCLOPramide (REGLAN) injection  10 mg Intravenous Q8H   pantoprazole  40 mg Oral Daily   sodium chloride flush  3 mL Intravenous Q12H   zolpidem  5 mg Oral QHS   Continuous Infusions:  sodium chloride     0.9 % NaCl with KCl 40 mEq / L 100 mL/hr at 10/11/22 0734   potassium chloride 10 mEq (10/11/22 1059)     LOS: 2 days    Time spent: 53 minutes spent on chart review, discussion with nursing staff, consultants, updating family and interview/physical exam; more than 50% of that time was spent in counseling and/or coordination of care.    Alvira Philips [place], DO [facility] Available via Epic secure chat 7am-7pm After these hours, please refer to coverage provider listed on [email or web address] 10/11/2022, 11:10 AM

## 2022-10-11 NOTE — Plan of Care (Signed)

## 2022-10-12 DIAGNOSIS — F12188 Cannabis abuse with other cannabis-induced disorder: Secondary | ICD-10-CM | POA: Diagnosis not present

## 2022-10-12 LAB — GLUCOSE, CAPILLARY
Glucose-Capillary: 109 mg/dL — ABNORMAL HIGH (ref 70–99)
Glucose-Capillary: 112 mg/dL — ABNORMAL HIGH (ref 70–99)
Glucose-Capillary: 97 mg/dL (ref 70–99)

## 2022-10-12 MED ORDER — HYDROMORPHONE HCL 1 MG/ML IJ SOLN
1.0000 mg | INTRAMUSCULAR | Status: DC | PRN
  Administered 2022-10-12 – 2022-10-14 (×9): 2 mg via INTRAVENOUS
  Administered 2022-10-14: 1 mg via INTRAVENOUS
  Filled 2022-10-12 (×9): qty 2
  Filled 2022-10-12: qty 1

## 2022-10-12 MED ORDER — SODIUM PHOSPHATES 45 MMOLE/15ML IV SOLN
15.0000 mmol | Freq: Once | INTRAVENOUS | Status: AC
  Administered 2022-10-12: 15 mmol via INTRAVENOUS
  Filled 2022-10-12: qty 5

## 2022-10-12 NOTE — Progress Notes (Signed)
PROGRESS NOTE    Deborah Kelly  EPP:[id number] DOB: 12-Mar-1990 DOA: 10/08/2022 PCP: Pcp, No    Brief Narrative:   Deborah Kelly is a 32 y.o. female with past medical history significant for bipolar disorder type I, idiopathic gastroparesis, chronic THC abuse, hypothyroidism secondary to Graves' disease, chronic pain syndrome, morbid obesity with multiple ED visits/hospitalizations for similar complaints who once again presented to [facility] ED on 8/1 complaining of nausea/vomiting over the last 4-5 days.  Reports unable to tolerate oral intake.  She reports this reoccurs every 2-3 weeks.  She reports has lost her job, currently undergoing eviction with plan to move in with her parents in [place].  Denies any sick contacts, no changes in her medications but does endorse continued marijuana use.  Per chart review patient has previous hospital admission in March 2024 due to intractable nausea vomiting the setting of idiopathic gastroparesis and concomitant hyperemesis cannabinoid syndrome.     Per chart review previous workup at [facility] 03/27/2022 [facility]-underwent abdominal surgery for PID and developed N/V with workup revealing gastric emptying prior therapy included Reglan and erythromycin and on recent admission had to use ativan and amitriptyline. Gastric emptying study 07/2021 on opioids 33% emptying at 4 hours EGD showed grade C esophagitis  In the ED, temperature 99.8 F, HR 83, RR 18, BP 148/108, SpO2 99% on room air.  WBC 15.7, hemoglobin 14.2, platelets 504.  Sodium 145, potassium 3.1, chloride 105, CO2 27, glucose 151, BUN 14, creatinine 1.09.  Agnesian 2.1, lipase 128, AST 24, ALT 20, total bilirubin 0.6.  Urinalysis was with 80 ketones, negative leukocytes, negative nitrite, few bacteria, 0-5 WBCs.  UDS positive for benzodiazepines, THC.  CT Abdo/pelvis with contrast with no bowel obstruction, no free air/free fluid, normal appendix, pancreas unremarkable without inflammatory changes.  Patient received hydromorphone 1 mg x 2, Ativan 0.5 mg x 2, Zofran 4 mg x 2, Protonix 40 mg IV once, KCl 10 mEq and NS 2.5 L bolus.  TRH consulted for admission for further evaluation management of intractable nausea/vomiting likely secondary to idiopathic gastroparesis/cannabinoid hyperemesis syndrome.   Assessment & Plan:   Intractable nausea/vomiting secondary to idiopathic gastroparesis versus cannabinoid hyperemesis syndrome Patient presenting with intractable nausea and vomiting over the last 4-5 days in addition to generalized abdominal pain.  Patient with multiple hospitalizations for the same.  CT abdomen/pelvis unrevealing.  UDS positive for THC. -- Reglan 10 mg IV every 8 hours -- Compazine/Zofran IV as needed -- Toradol IV PRN -- K-pad -- Dilaudid 1-2 mg every 4 hours as needed moderate/severe pain -- IVF with LR at 75 mL/h -- Full liquid diet, will advance to soft diet as tolerates  Elevated lipase Elevated lipase at 128, normal AST/ALT/ALP and bilirubin level.  CT abdomen/pelvis no acute abdominal finding. --Supportive care as above  Acute renal failure secondary to dehydration: Resolved Creatinine 1.09, likely secondary to poor oral intake with intractable nausea/vomiting. -- Cr 1.09>0.74>0.65 --Continue IV fluid hydration -- BMP in a.m.  Hypokalemia Repleted, potassium 4.1, magnesium 2.2. -- repeat electrolytes in a.m.  Hypophosphatemia Phosphorus 2.1, will replete. -- Repeat phosphorus level in a.m.  Leukocytosis: Resolved WBC elevated 15.7, afebrile.  No concern for active infection.  Etiology likely hemoconcentration. -- WBC 15.7>10.3  Bipolar 1 disorder Home regimen includes Lamictal 400 mg p.o. nightly, Haldol 10 mg p.o. nightly, clonazepam 1 mg p.o. twice daily -- Hold clonazepam in favor of Ativan 1 mg IV twice daily for now -- Continue Lamictal and Haldol  Type 2  diabetes mellitus On metformin 500 mg p.o. daily outpatient.  Hemoglobin A1c 6.6 on  05/20/2022, well-controlled. -- Hold metformin while inpatient -- SSI for coverage -- CBG before every meal/at bedtime  History of hypothyroidism secondary to Graves' disease Currently not on medication at home.  TSH 0.942, free T41.31.  Insomnia -- Ambien 5 mg p.o. nightly  Morbid Obesity Body mass index is 59.26 kg/m.  Discussed with patient needs for aggressive lifestyle changes/weight loss as this complicates all facets of care.  Outpatient follow-up with PCP.    DVT prophylaxis: SCDs Start: 10/08/22 2044    Code Status: Full Code Family Communication: No family present at bedside this morning  Disposition Plan:  Level of care: Med-Surg Status is: Inpatient Remains inpatient appropriate because: IV fluids, IV antibiotics; needs further advancement of diet with toleration before stable for discharge home, likely 1-2 days    Consultants:  None  Procedures:  None  Antimicrobials:  None   Subjective: Patient seen examined bedside, resting comfortably.  Lying in bed.  Tolerating clear liquid diet, wants further advancement to full liquids today.  No other specific questions or concerns at this time.  Denies headache, no fever/chills/night sweats, no current vomiting/diarrhea, no abdominal pain, no focal weakness, no fatigue, no chest pain/palpitations, no paresthesias.  No acute events overnight per nurse staff.  Objective: Vitals:   10/11/22 0557 10/11/22 1407 10/11/22 2059 10/12/22 0601  BP: 131/63 132/82 132/74 (!) 124/59  Pulse: 93 89 84 91  Resp: 18 20 18 18   Temp: 97.9 F (36.6 C) 98 F (36.7 C) 98.4 F (36.9 C) (!) 97.5 F (36.4 C)  TempSrc: Oral Oral Oral Oral  SpO2: 97% 97% 100% 97%  Weight:      Height:        Intake/Output Summary (Last 24 hours) at 10/12/2022 1023 Last data filed at 10/12/2022 1013 Gross per 24 hour  Intake 2851.11 ml  Output 1525 ml  Net 1326.11 ml   Filed Weights   10/08/22 1102 10/11/22 0500  Weight: (!) 172.4 kg (!) 176.8 kg     Examination:  Physical Exam: GEN: NAD, alert and oriented x 3, obese HEENT: NCAT, PERRL, EOMI, sclera clear, MMM PULM: CTAB w/o wheezes/crackles, normal respiratory effort, on room air CV: RRR w/o M/G/R GI: abd soft, NTND, NABS, no R/G/M MSK: no peripheral edema, muscle strength globally intact 5/5 bilateral upper/lower extremities NEURO: CN II-XII intact, no focal deficits, sensation to light touch intact PSYCH: normal mood/affect Integumentary: dry/intact, no rashes or wounds    Data Reviewed: I have personally reviewed following labs and imaging studies  CBC: Recent Labs  Lab 10/08/22 1200 10/09/22 0620 10/10/22 0550  WBC 15.7* 16.9* 10.3  HGB 14.2 12.9 11.9*  HCT 44.1 41.6 37.6  MCV 87.0 89.8 89.5  PLT 504* 447* 373   Basic Metabolic Panel: Recent Labs  Lab 10/08/22 1200 10/08/22 1210 10/09/22 0620 10/10/22 0550 10/11/22 0532 10/12/22 0415  NA 145  --  141 140 135 137  K 3.1*  --  3.1* 3.1* 2.9* 4.1  CL 105  --  106 103 100 106  CO2 27  --  24 24 25 25   GLUCOSE 151*  --  119* 107* 108* 94  BUN 14  --  11 6 <5* <5*  CREATININE 1.09*  --  0.88 0.74 0.65 0.65  CALCIUM 9.6  --  8.3* 8.0* 8.1* 8.0*  MG  --  2.1  --  2.1 1.9 2.2  PHOS  --   --   --  2.6  --  2.1*   GFR: Estimated Creatinine Clearance: 178.7 mL/min (by C-G formula based on SCr of 0.65 mg/dL). Liver Function Tests: Recent Labs  Lab 10/08/22 1200 10/09/22 0620  AST 24 34  ALT 20 28  ALKPHOS 93 79  BILITOT 0.6 0.9  PROT 8.6* 7.4  ALBUMIN 4.6 4.1   Recent Labs  Lab 10/08/22 1200  LIPASE 128*   No results for input(s): "AMMONIA" in the last 168 hours. Coagulation Profile: No results for input(s): "INR", "PROTIME" in the last 168 hours. Cardiac Enzymes: No results for input(s): "CKTOTAL", "CKMB", "CKMBINDEX", "TROPONINI" in the last 168 hours. BNP (last 3 results) No results for input(s): "PROBNP" in the last 8760 hours. HbA1C: No results for input(s): "HGBA1C" in the last 72  hours. CBG: Recent Labs  Lab 10/11/22 0755 10/11/22 1203 10/11/22 1752 10/11/22 2122 10/12/22 0812  GLUCAP 109* 131* 89 79 109*   Lipid Profile: No results for input(s): "CHOL", "HDL", "LDLCALC", "TRIG", "CHOLHDL", "LDLDIRECT" in the last 72 hours. Thyroid Function Tests: Recent Labs    10/10/22 0550  TSH 0.942  FREET4 1.31*   Anemia Panel: No results for input(s): "VITAMINB12", "FOLATE", "FERRITIN", "TIBC", "IRON", "RETICCTPCT" in the last 72 hours. Sepsis Labs: No results for input(s): "PROCALCITON", "LATICACIDVEN" in the last 168 hours.  No results found for this or any previous visit (from the past 240 hour(s)).       Radiology Studies: No results found.      Scheduled Meds:  enoxaparin (LOVENOX) injection  80 mg Subcutaneous Q24H   gabapentin  300 mg Oral TID   haloperidol  10 mg Oral QHS   insulin aspart  0-5 Units Subcutaneous QHS   insulin aspart  0-6 Units Subcutaneous TID WC   lamoTRIgine  400 mg Oral QHS   LORazepam  1 mg Intravenous BID   metoCLOPramide (REGLAN) injection  10 mg Intravenous Q8H   pantoprazole  40 mg Oral Daily   sodium chloride flush  3 mL Intravenous Q12H   zolpidem  5 mg Oral QHS   Continuous Infusions:  sodium chloride     0.9 % NaCl with KCl 40 mEq / L 100 mL/hr at 10/12/22 0405   sodium phosphate 15 mmol in dextrose 5 % 250 mL infusion       LOS: 3 days    Time spent: 53 minutes spent on chart review, discussion with nursing staff, consultants, updating family and interview/physical exam; more than 50% of that time was spent in counseling and/or coordination of care.    Alvira Philips [place], DO [facility] Available via Epic secure chat 7am-7pm After these hours, please refer to coverage provider listed on [email or web address] 10/12/2022, 10:23 AM

## 2022-10-12 NOTE — Plan of Care (Signed)

## 2022-10-13 DIAGNOSIS — F12188 Cannabis abuse with other cannabis-induced disorder: Secondary | ICD-10-CM | POA: Diagnosis not present

## 2022-10-13 LAB — GLUCOSE, CAPILLARY
Glucose-Capillary: 95 mg/dL (ref 70–99)
Glucose-Capillary: 96 mg/dL (ref 70–99)
Glucose-Capillary: 97 mg/dL (ref 70–99)
Glucose-Capillary: 78 mg/dL (ref 70–99)
Glucose-Capillary: 92 mg/dL (ref 70–99)

## 2022-10-13 MED ORDER — SODIUM PHOSPHATES 45 MMOLE/15ML IV SOLN
15.0000 mmol | Freq: Once | INTRAVENOUS | Status: AC
  Administered 2022-10-13: 15 mmol via INTRAVENOUS

## 2022-10-13 MED ORDER — SODIUM PHOSPHATES 45 MMOLE/15ML IV SOLN
15.0000 mmol | Freq: Once | INTRAVENOUS | Status: DC
  Filled 2022-10-13: qty 5

## 2022-10-13 NOTE — Progress Notes (Signed)
PROGRESS NOTE    Deborah Kelly  QIO:[id number] DOB: Oct 01, 1990 DOA: 10/08/2022 PCP: Pcp, No    Brief Narrative:   Deborah Kelly is a 32 y.o. female with past medical history significant for bipolar disorder type I, idiopathic gastroparesis, chronic THC abuse, hypothyroidism secondary to Graves' disease, chronic pain syndrome, morbid obesity with multiple ED visits/hospitalizations for similar complaints who once again presented to [facility] ED on 8/1 complaining of nausea/vomiting over the last 4-5 days.  Reports unable to tolerate oral intake.  She reports this reoccurs every 2-3 weeks.  She reports has lost her job, currently undergoing eviction with plan to move in with her parents in [place].  Denies any sick contacts, no changes in her medications but does endorse continued marijuana use.  Per chart review patient has previous hospital admission in March 2024 due to intractable nausea vomiting the setting of idiopathic gastroparesis and concomitant hyperemesis cannabinoid syndrome.     Per chart review previous workup at [facility] 03/27/2022 [facility]-underwent abdominal surgery for PID and developed N/V with workup revealing gastric emptying prior therapy included Reglan and erythromycin and on recent admission had to use ativan and amitriptyline. Gastric emptying study 07/2021 on opioids 33% emptying at 4 hours EGD showed grade C esophagitis  In the ED, temperature 99.8 F, HR 83, RR 18, BP 148/108, SpO2 99% on room air.  WBC 15.7, hemoglobin 14.2, platelets 504.  Sodium 145, potassium 3.1, chloride 105, CO2 27, glucose 151, BUN 14, creatinine 1.09.  Agnesian 2.1, lipase 128, AST 24, ALT 20, total bilirubin 0.6.  Urinalysis was with 80 ketones, negative leukocytes, negative nitrite, few bacteria, 0-5 WBCs.  UDS positive for benzodiazepines, THC.  CT Abdo/pelvis with contrast with no bowel obstruction, no free air/free fluid, normal appendix, pancreas unremarkable without inflammatory changes.  Patient received hydromorphone 1 mg x 2, Ativan 0.5 mg x 2, Zofran 4 mg x 2, Protonix 40 mg IV once, KCl 10 mEq and NS 2.5 L bolus.  TRH consulted for admission for further evaluation management of intractable nausea/vomiting likely secondary to idiopathic gastroparesis/cannabinoid hyperemesis syndrome.   Assessment & Plan:   Intractable nausea/vomiting secondary to idiopathic gastroparesis versus cannabinoid hyperemesis syndrome Patient presenting with intractable nausea and vomiting over the last 4-5 days in addition to generalized abdominal pain.  Patient with multiple hospitalizations for the same.  CT abdomen/pelvis unrevealing.  UDS positive for THC. -- Reglan 10 mg IV every 8 hours -- Compazine/Zofran IV as needed -- Toradol IV PRN -- K-pad -- Dilaudid 1-2 mg every 4 hours as needed moderate/severe pain -- IVF with LR at 75 mL/h -- Advance to soft diet  Elevated lipase Elevated lipase at 128, normal AST/ALT/ALP and bilirubin level.  CT abdomen/pelvis no acute abdominal finding. --Supportive care as above  Acute renal failure secondary to dehydration: Resolved Creatinine 1.09, likely secondary to poor oral intake with intractable nausea/vomiting. -- Cr 1.09>0.74>0.65 --Continue IV fluid hydration -- BMP in a.m.  Hypokalemia Repleted, potassium 4.6, magnesium 2.1. -- repeat electrolytes in a.m.  Hypophosphatemia Phosphorus 2.1, will replete. -- Repeat phosphorus level in a.m.  Leukocytosis: Resolved WBC elevated 15.7, afebrile.  No concern for active infection.  Etiology likely hemoconcentration. -- WBC 15.7>10.3  Bipolar 1 disorder Home regimen includes Lamictal 400 mg p.o. nightly, Haldol 10 mg p.o. nightly, clonazepam 1 mg p.o. twice daily -- Hold clonazepam in favor of Ativan 1 mg IV twice daily for now -- Continue Lamictal and Haldol  Type 2 diabetes mellitus On metformin 500 mg  p.o. daily outpatient.  Hemoglobin A1c 6.6 on 05/20/2022, well-controlled. -- Hold  metformin while inpatient -- SSI for coverage -- CBG before every meal/at bedtime  History of hypothyroidism secondary to Graves' disease Currently not on medication at home.  TSH 0.942, free T4 1.31.  Insomnia -- Ambien 5 mg p.o. nightly  Morbid Obesity Body mass index is 59.9 kg/m.  Discussed with patient needs for aggressive lifestyle changes/weight loss as this complicates all facets of care.  Outpatient follow-up with PCP.    DVT prophylaxis: SCDs Start: 10/08/22 2044    Code Status: Full Code Family Communication: No family present at bedside this morning  Disposition Plan:  Level of care: Med-Surg Status is: Inpatient Remains inpatient appropriate because: IV fluids, IV antibiotics; advancing to soft diet today, if tolerates anticipate discharge home tomorrow    Consultants:  None  Procedures:  None  Antimicrobials:  None   Subjective: Patient seen examined bedside, resting comfortably.  Lying in bed.  Multiple complaints/frustrations today.  Reports that she was not able to order "ice cream" yesterday due to milk allergy listed in her chart, states there is no allergy to milk and chart was updated.  Also upset about not receiving her pain medications "in a timely manner".  Also believes her nausea may be related to not having much to eat and would like to try a soft diet today.  Discussed with the patient that if she tolerates anticipate discharging home tomorrow. No other specific questions or concerns at this time.  Denies headache, no fever/chills/night sweats, no current vomiting/diarrhea, no abdominal pain, no focal weakness, no fatigue, no chest pain/palpitations, no paresthesias.  No acute events overnight per nurse staff.  Objective: Vitals:   10/12/22 0601 10/12/22 1431 10/12/22 2254 10/13/22 0445  BP: (!) 124/59 109/72 (!) 149/91 122/83  Pulse: 91 88 81 89  Resp: 18 14 20 20   Temp: (!) 97.5 F (36.4 C) 98.5 F (36.9 C) 99.2 F (37.3 C) 98.3 F (36.8  C)  TempSrc: Oral Oral Oral Oral  SpO2: 97% 93% 100% 100%  Weight:    (!) 178.7 kg  Height:        Intake/Output Summary (Last 24 hours) at 10/13/2022 1220 Last data filed at 10/13/2022 1000 Gross per 24 hour  Intake 4326.56 ml  Output 1900 ml  Net 2426.56 ml   Filed Weights   10/08/22 1102 10/11/22 0500 10/13/22 0445  Weight: (!) 172.4 kg (!) 176.8 kg (!) 178.7 kg    Examination:  Physical Exam: GEN: NAD, alert and oriented x 3, obese HEENT: NCAT, PERRL, EOMI, sclera clear, MMM PULM: CTAB w/o wheezes/crackles, normal respiratory effort, on room air CV: RRR w/o M/G/R GI: abd soft, NTND, NABS, no R/G/M MSK: no peripheral edema, muscle strength globally intact 5/5 bilateral upper/lower extremities NEURO: CN II-XII intact, no focal deficits, sensation to light touch intact PSYCH: normal mood/affect Integumentary: dry/intact, no rashes or wounds    Data Reviewed: I have personally reviewed following labs and imaging studies  CBC: Recent Labs  Lab 10/08/22 1200 10/09/22 0620 10/10/22 0550  WBC 15.7* 16.9* 10.3  HGB 14.2 12.9 11.9*  HCT 44.1 41.6 37.6  MCV 87.0 89.8 89.5  PLT 504* 447* 373   Basic Metabolic Panel: Recent Labs  Lab 10/08/22 1210 10/09/22 0620 10/10/22 0550 10/11/22 0532 10/12/22 0415 10/13/22 0643  NA  --  141 140 135 137 135  K  --  3.1* 3.1* 2.9* 4.1 4.6  CL  --  106 103  100 106 102  CO2  --  24 24 25 25 26   GLUCOSE  --  119* 107* 108* 94 108*  BUN  --  11 6 <5* <5* <5*  CREATININE  --  0.88 0.74 0.65 0.65 0.68  CALCIUM  --  8.3* 8.0* 8.1* 8.0* 8.0*  MG 2.1  --  2.1 1.9 2.2 2.1  PHOS  --   --  2.6  --  2.1* 2.1*   GFR: Estimated Creatinine Clearance: 179.9 mL/min (by C-G formula based on SCr of 0.68 mg/dL). Liver Function Tests: Recent Labs  Lab 10/08/22 1200 10/09/22 0620  AST 24 34  ALT 20 28  ALKPHOS 93 79  BILITOT 0.6 0.9  PROT 8.6* 7.4  ALBUMIN 4.6 4.1   Recent Labs  Lab 10/08/22 1200  LIPASE 128*   No results for  input(s): "AMMONIA" in the last 168 hours. Coagulation Profile: No results for input(s): "INR", "PROTIME" in the last 168 hours. Cardiac Enzymes: No results for input(s): "CKTOTAL", "CKMB", "CKMBINDEX", "TROPONINI" in the last 168 hours. BNP (last 3 results) No results for input(s): "PROBNP" in the last 8760 hours. HbA1C: No results for input(s): "HGBA1C" in the last 72 hours. CBG: Recent Labs  Lab 10/12/22 1648 10/12/22 2255 10/13/22 0730 10/13/22 0746 10/13/22 1149  GLUCAP 97 87 95 96 97   Lipid Profile: No results for input(s): "CHOL", "HDL", "LDLCALC", "TRIG", "CHOLHDL", "LDLDIRECT" in the last 72 hours. Thyroid Function Tests: No results for input(s): "TSH", "T4TOTAL", "FREET4", "T3FREE", "THYROIDAB" in the last 72 hours.  Anemia Panel: No results for input(s): "VITAMINB12", "FOLATE", "FERRITIN", "TIBC", "IRON", "RETICCTPCT" in the last 72 hours. Sepsis Labs: No results for input(s): "PROCALCITON", "LATICACIDVEN" in the last 168 hours.  No results found for this or any previous visit (from the past 240 hour(s)).       Radiology Studies: No results found.      Scheduled Meds:  enoxaparin (LOVENOX) injection  80 mg Subcutaneous Q24H   gabapentin  300 mg Oral TID   haloperidol  10 mg Oral QHS   insulin aspart  0-5 Units Subcutaneous QHS   insulin aspart  0-6 Units Subcutaneous TID WC   lamoTRIgine  400 mg Oral QHS   LORazepam  1 mg Intravenous BID   metoCLOPramide (REGLAN) injection  10 mg Intravenous Q8H   pantoprazole  40 mg Oral Daily   sodium chloride flush  3 mL Intravenous Q12H   zolpidem  5 mg Oral QHS   Continuous Infusions:  sodium chloride     0.9 % NaCl with KCl 40 mEq / L 100 mL/hr at 10/13/22 1000   sodium phosphate 15 mmol in dextrose 5 % 250 mL infusion 42 mL/hr at 10/13/22 1000     LOS: 4 days    Time spent: 53 minutes spent on chart review, discussion with nursing staff, consultants, updating family and interview/physical exam; more  than 50% of that time was spent in counseling and/or coordination of care.    Alvira Philips [place], DO [facility] Available via Epic secure chat 7am-7pm After these hours, please refer to coverage provider listed on [email or web address] 10/13/2022, 12:20 PM

## 2022-10-13 NOTE — Plan of Care (Signed)
Patient still having nausea and pain control issues.  No vomiting after meal.

## 2022-10-13 NOTE — Plan of Care (Signed)
  Problem: Metabolic: Goal: Ability to maintain appropriate glucose levels will improve Outcome: Progressing   Problem: Nutritional: Goal: Maintenance of adequate nutrition will improve Outcome: Progressing   

## 2022-10-14 DIAGNOSIS — F12188 Cannabis abuse with other cannabis-induced disorder: Secondary | ICD-10-CM | POA: Diagnosis not present

## 2022-10-14 LAB — GLUCOSE, CAPILLARY
Glucose-Capillary: 81 mg/dL (ref 70–99)
Glucose-Capillary: 145 mg/dL — ABNORMAL HIGH (ref 70–99)

## 2022-10-14 MED ORDER — PROCHLORPERAZINE MALEATE 5 MG PO TABS: 5.0000 mg | ORAL_TABLET | Freq: Four times a day (QID) | ORAL | 0 refills | Status: AC | PRN

## 2022-10-14 MED ORDER — METOCLOPRAMIDE HCL 5 MG PO TABS: 5.0000 mg | ORAL_TABLET | Freq: Three times a day (TID) | ORAL | 0 refills | Status: AC

## 2022-10-14 MED ORDER — METOCLOPRAMIDE HCL 5 MG PO TABS
5.0000 mg | ORAL_TABLET | Freq: Three times a day (TID) | ORAL | Status: DC
  Administered 2022-10-14: 5 mg via ORAL
  Filled 2022-10-14: qty 1

## 2022-10-14 NOTE — Discharge Summary (Signed)
Physician Discharge Summary   Patient: Deborah Kelly MRN: [id number] DOB: Jun 20, 1990  Admit date:     10/08/2022  Discharge date: 10/14/22  Discharge Physician: Jon Billings A Pranavi Aure   PCP: Pcp, No   Recommendations at discharge:    Further evaluation for gastroparesis   Discharge Diagnoses: Principal Problem:   Cannabis hyperemesis syndrome concurrent with and due to cannabis abuse (HCC) Active Problems:   Cannabinoid hyperemesis syndrome   Intractable nausea and vomiting   Idiopathic gastroparesis   Obesity, Class III, BMI 40-49.9 (morbid obesity) (HCC)   Bipolar disorder (HCC)   Hypokalemia   Leukocytosis   Elevated lipase   Hypothyroidism   AKI (acute kidney injury) (HCC)   Intractable cyclical vomiting with nausea  Resolved Problems:   * No resolved hospital problems. *  Hospital Course: 32 year old with past medical history significant for bipolar disorder type I, idiopathic gastroparesis, chronic THC abuse, hypothyroidism, secondary to Graves' disease, chronic pain syndrome, morbid obesity multiple ED visits/hospitalization for similar complaints who presents to [facility], ED 80/1 complaining of nausea vomiting over the last for 5 days.  Episode occurs every 2 to 3 weeks.  She reports that she has lost her job, currently undergoing infection with plan to move in with her parents in [place].  Continue marijuana use.  Per chart review previous hospital admission in March 2024 due to intractable nausea vomiting in the setting of idiopathic gastroparesis and concomitant hyperemesis cannabinoid syndrome.   Per chart review previous workup at [facility] 03/27/2022 [facility]-underwent abdominal surgery for PID and developed N/V with workup revealing gastric emptying prior therapy included Reglan and erythromycin and on recent admission had to use ativan and amitriptyline. Gastric emptying study 07/2021 on opioids 33% emptying at 4 hours EGD showed grade C esophagitis   Assessment and  Plan: Intractable nausea/vomiting secondary to idiopathic gastroparesis versus cannabinoid hyperemesis syndrome Patient presenting with intractable nausea and vomiting over the last 4-5 days in addition to generalized abdominal pain.  Patient with multiple hospitalizations for the same.  CT abdomen/pelvis unrevealing.  UDS positive for THC. -- Treated with Reglan 10 mg IV every 8 hours -- Compazine/Zofran IV as needed -- Toradol IV PRN -- K-pad Tolerated diet. No further vomiting. Counseling provided about Marihuana used. Discharge on Reglan. PRN compazine.    Elevated lipase Elevated lipase at 128, normal AST/ALT/ALP and bilirubin level.  CT abdomen/pelvis no acute abdominal finding. --Supportive care as above   Acute renal failure secondary to dehydration: Resolved Creatinine 1.09, likely secondary to poor oral intake with intractable nausea/vomiting. -- Cr 1.09>0.74>0.65 --Continue IV fluid hydration -- BMP in a.m.   Hypokalemia Repleted, potassium 4.6, magnesium 2.1. --Replaced.    Hypophosphatemia Phosphorus 2.1, will replete. -- Replaced.    Leukocytosis: Resolved WBC elevated 15.7, afebrile.  No concern for active infection.  Etiology likely hemoconcentration. -- WBC 15.7>10.3   Bipolar 1 disorder Home regimen includes Lamictal 400 mg p.o. nightly, Haldol 10 mg p.o. nightly, clonazepam 1 mg p.o. twice daily -- Hold clonazepam in favor of Ativan 1 mg IV twice daily for now -- Continue Lamictal and Haldol   Type 2 diabetes mellitus On metformin 500 mg p.o. daily outpatient.  Hemoglobin A1c 6.6 on 05/20/2022, well-controlled. -- Hold metformin while inpatient -- SSI for coverage -- CBG before every meal/at bedtime   History of hypothyroidism secondary to Graves' disease Currently not on medication at home.  TSH 0.942, free T4 1.31.   Insomnia -- Ambien 5 mg p.o. nightly   Morbid Obesity Body  mass index is 59.9 kg/m.  Discussed with patient needs for aggressive  lifestyle changes/weight loss as this complicates all facets of care.  Outpatient follow-up with PCP.            Consultants: None Procedures performed: None Disposition: Home Diet recommendation:  Discharge Diet Orders (From admission, onward)     Start     Ordered   10/14/22 0000  Diet - low sodium heart healthy        10/14/22 1244           Carb modified diet DISCHARGE MEDICATION: Allergies as of 10/14/2022       Reactions   Lisinopril Anaphylaxis   Mother had anaphylactic rxn to ACEI   Penicillins Rash, Swelling, Hives, Shortness Of Breath   Throat swells Other Reaction(s): Other (See Comments)   Amlodipine Hives   Other Reaction(s): Not available, Other (See Comments)   Dicyclomine Other (See Comments), Rash   Pt states it makes her hands shake; Other Reaction(s): Not available, Other (See Comments) Pt states it makes her hands shake;     Pt states it makes her hands shake; pt also states a rash.     Pt states it makes her hands shake; pt also states a rash.   Pt states it makes her hands shake; Pt states it makes her hands shake; pt also states a rash.     Pt states it makes her hands shake;        Medication List     STOP taking these medications    ondansetron 4 MG disintegrating tablet Commonly known as: ZOFRAN-ODT   potassium chloride SA 20 MEQ tablet Commonly known as: KLOR-CON M       TAKE these medications    bisacodyl 5 MG EC tablet Commonly known as: DULCOLAX Take 2 tablets (10 mg total) by mouth daily. What changed:  when to take this reasons to take this   clonazePAM 1 MG tablet Commonly known as: KLONOPIN Take 1 mg by mouth in the morning and at bedtime.   gabapentin 300 MG capsule Commonly known as: NEURONTIN Take 300 mg by mouth 3 (three) times daily.   haloperidol 5 MG tablet Commonly known as: HALDOL Take 10 mg by mouth at bedtime.   lamoTRIgine 200 MG tablet Commonly known as: LAMICTAL Take 1 tablet (200 mg  total) by mouth daily. What changed:  how much to take when to take this   metFORMIN 500 MG 24 hr tablet Commonly known as: GLUCOPHAGE-XR Take 500 mg by mouth daily with breakfast.   metoCLOPramide 5 MG tablet Commonly known as: REGLAN Take 1 tablet (5 mg total) by mouth 4 (four) times daily -  before meals and at bedtime for 20 days.   naproxen sodium 220 MG tablet Commonly known as: ALEVE Take 220-440 mg by mouth 2 (two) times daily as needed (for headaches or pain).   pantoprazole 40 MG tablet Commonly known as: Protonix Take 1 tablet (40 mg total) by mouth daily. What changed: when to take this   polyethylene glycol 17 g packet Commonly known as: MIRALAX / GLYCOLAX Take 17 g by mouth daily. What changed:  when to take this reasons to take this   prochlorperazine 5 MG tablet Commonly known as: COMPAZINE Take 1 tablet (5 mg total) by mouth every 6 (six) hours as needed for up to 5 days for nausea or vomiting.   Senna-S 8.6-50 MG tablet Generic drug: senna-docusate Take 1 tablet by mouth 2 (  two) times daily as needed for mild constipation.   zolpidem 6.25 MG CR tablet Commonly known as: AMBIEN CR Take 12.5 mg by mouth at bedtime.        Discharge Exam: Filed Weights   10/11/22 0500 10/13/22 0445 10/14/22 0500  Weight: (!) 176.8 kg (!) 178.7 kg (!) 180.1 kg   General; NAD  Condition at discharge: stable  The results of significant diagnostics from this hospitalization (including imaging, microbiology, ancillary and laboratory) are listed below for reference.   Imaging Studies: CT ABDOMEN PELVIS W CONTRAST  Result Date: 10/08/2022 CLINICAL DATA:  Pain.  Nausea and vomiting for 4 days. EXAM: CT ABDOMEN AND PELVIS WITH CONTRAST TECHNIQUE: Multidetector CT imaging of the abdomen and pelvis was performed using the standard protocol following bolus administration of intravenous contrast. RADIATION DOSE REDUCTION: This exam was performed according to the  departmental dose-optimization program which includes automated exposure control, adjustment of the mA and/or kV according to patient size and/or use of iterative reconstruction technique. CONTRAST:  OMNIPAQUE IOHEXOL 300 MG/ML  SOLN COMPARISON:  CT 07/24/2021 and older. HIDA scan 05/21/2022. Gastric emptying study 2023 May FINDINGS: Lower chest: Lung bases are grossly clear.  No pleural effusion. Hepatobiliary: No focal liver abnormality is seen. No gallstones, gallbladder wall thickening, or biliary dilatation. Patent portal vein. Pancreas: Unremarkable. No pancreatic ductal dilatation or surrounding inflammatory changes. Spleen: Normal in size without focal abnormality. Adrenals/Urinary Tract: Adrenal glands are unremarkable. Kidneys are normal, without renal calculi, focal lesion, or hydronephrosis. Bladder is unremarkable. Stomach/Bowel: Stomach is within normal limits. Appendix appears normal. No evidence of bowel wall thickening, distention, or inflammatory changes. Vascular/Lymphatic: No significant vascular findings are present. No enlarged abdominal or pelvic lymph nodes. Reproductive: Uterus and bilateral adnexa are unremarkable. Other: No abdominal wall hernia or abnormality. No abdominopelvic ascites. Musculoskeletal: No acute or significant osseous findings. IMPRESSION: No bowel obstruction, free air or free fluid.  Normal appendix. Electronically Signed   By: Karen Kays M.D.   On: 10/08/2022 18:12    Microbiology: Results for orders placed or performed during the hospital encounter of 09/02/21  Urine Culture     Status: Abnormal   Collection Time: 09/02/21  4:04 AM   Specimen: Urine, Random  Result Value Ref Range Status   Specimen Description   Final    URINE, RANDOM Performed at [facility], [address] [place], [place] [address]    Special Requests   Final    NONE Performed at [facility], [address] [place], [place] [address]     Culture MULTIPLE SPECIES PRESENT, SUGGEST RECOLLECTION (A)  Final   Report Status 09/04/2021 FINAL  Final    Labs: CBC: Recent Labs  Lab 10/08/22 1200 10/09/22 0620 10/10/22 0550 10/14/22 0423  WBC 15.7* 16.9* 10.3 8.4  HGB 14.2 12.9 11.9* 11.7*  HCT 44.1 41.6 37.6 36.3  MCV 87.0 89.8 89.5 88.3  PLT 504* 447* 373 360   Basic Metabolic Panel: Recent Labs  Lab 10/10/22 0550 10/11/22 0532 10/12/22 0415 10/13/22 0643 10/14/22 0423  NA 140 135 137 135 136  K 3.1* 2.9* 4.1 4.6 4.1  CL 103 100 106 102 105  CO2 24 25 25 26 25   GLUCOSE 107* 108* 94 108* 109*  BUN 6 <5* <5* <5* <5*  CREATININE 0.74 0.65 0.65 0.68 0.78  CALCIUM 8.0* 8.1* 8.0* 8.0* 8.5*  MG 2.1 1.9 2.2 2.1 1.9  PHOS 2.6  --  2.1* 2.1* 2.8  Liver Function Tests: Recent Labs  Lab 10/08/22 1200 10/09/22 0620  AST 24 34  ALT 20 28  ALKPHOS 93 79  BILITOT 0.6 0.9  PROT 8.6* 7.4  ALBUMIN 4.6 4.1   CBG: Recent Labs  Lab 10/13/22 1149 10/13/22 1701 10/13/22 2028 10/14/22 0747 10/14/22 1114  GLUCAP 97 78 92 81 145*    Discharge time spent: greater than 30 minutes.  Signed: Alba Cory, MD [facility] 10/14/2022

## 2022-10-14 NOTE — Progress Notes (Incomplete)
PROGRESS NOTE    Deborah Kelly  YQI:[id number] DOB: 14-Mar-1990 DOA: 10/08/2022 PCP: Pcp, No   Brief Narrative: 32 year old with past medical history significant for bipolar disorder type I, idiopathic gastroparesis, chronic THC abuse, hypothyroidism, secondary to Graves' disease, chronic pain syndrome, morbid obesity multiple ED visits/hospitalization for similar complaints who presents to [facility], ED 80/1 complaining of nausea vomiting over the last for 5 days.  Episode occurs every 2 to 3 weeks.  She reports that she has lost her job, currently undergoing infection with plan to move in with her parents in [place].  Continue marijuana use.  Per chart review previous hospital admission in March 2024 due to intractable nausea vomiting in the setting of idiopathic gastroparesis and concomitant hyperemesis cannabinoid syndrome.  Per chart review previous workup at [facility] 03/27/2022 [facility]-underwent abdominal surgery for PID and developed N/V with workup revealing gastric emptying prior therapy included Reglan and erythromycin and on recent admission had to use ativan and amitriptyline. Gastric emptying study 07/2021 on opioids 33% emptying at 4 hours EGD showed grade C esophagitis     Assessment & Plan:   Principal Problem:   Cannabis hyperemesis syndrome concurrent with and due to cannabis abuse (HCC) Active Problems:   Cannabinoid hyperemesis syndrome   Intractable nausea and vomiting   Idiopathic gastroparesis   Obesity, Class III, BMI 40-49.9 (morbid obesity) (HCC)   Bipolar disorder (HCC)   Hypokalemia   Leukocytosis   Elevated lipase   Hypothyroidism   AKI (acute kidney injury) (HCC)   Intractable cyclical vomiting with nausea   ***                   Estimated body mass index is 60.37 kg/m as calculated from the following:   Height as of this encounter: 5\' 8"  (1.727 m).   Weight as of this encounter: 180.1 kg.   DVT prophylaxis:  Code Status:  Family  Communication: Disposition Plan:  Status is: Inpatient {Inpatient:23812}    Consultants:  ***  Procedures:  ***  Antimicrobials:    Subjective: ***  Objective: Vitals:   10/13/22 1340 10/13/22 2027 10/14/22 0500 10/14/22 0500  BP: 127/72 123/86  128/78  Pulse: 96 92  90  Resp: 14 18  19   Temp: 98.5 F (36.9 C) 98.2 F (36.8 C)  98.6 F (37 C)  TempSrc: Oral Oral  Oral  SpO2: 100% 98%  98%  Weight:   (!) 180.1 kg   Height:        Intake/Output Summary (Last 24 hours) at 10/14/2022 0752 Last data filed at 10/14/2022 0501 Gross per 24 hour  Intake 4117.82 ml  Output 2000 ml  Net 2117.82 ml   Filed Weights   10/11/22 0500 10/13/22 0445 10/14/22 0500  Weight: (!) 176.8 kg (!) 178.7 kg (!) 180.1 kg    Examination:  General exam: Appears calm and comfortable  Respiratory system: Clear to auscultation. Respiratory effort normal. Cardiovascular system: S1 & S2 heard, RRR. No JVD, murmurs, rubs, gallops or clicks. No pedal edema. Gastrointestinal system: Abdomen is nondistended, soft and nontender. No organomegaly or masses felt. Normal bowel sounds heard. Central nervous system: Alert and oriented. No focal neurological deficits. Extremities: Symmetric 5 x 5 power. Skin: No rashes, lesions or ulcers Psychiatry: Judgement and insight appear normal. Mood & affect appropriate.     Data Reviewed: I have personally reviewed following labs and imaging studies  CBC: Recent Labs  Lab 10/08/22 1200 10/09/22 0620 10/10/22 0550 10/14/22 0423  WBC 15.7*  16.9* 10.3 8.4  HGB 14.2 12.9 11.9* 11.7*  HCT 44.1 41.6 37.6 36.3  MCV 87.0 89.8 89.5 88.3  PLT 504* 447* 373 360   Basic Metabolic Panel: Recent Labs  Lab 10/10/22 0550 10/11/22 0532 10/12/22 0415 10/13/22 0643 10/14/22 0423  NA 140 135 137 135 136  K 3.1* 2.9* 4.1 4.6 4.1  CL 103 100 106 102 105  CO2 24 25 25 26 25   GLUCOSE 107* 108* 94 108* 109*  BUN 6 <5* <5* <5* <5*  CREATININE 0.74 0.65 0.65 0.68  0.78  CALCIUM 8.0* 8.1* 8.0* 8.0* 8.5*  MG 2.1 1.9 2.2 2.1 1.9  PHOS 2.6  --  2.1* 2.1* 2.8   GFR: Estimated Creatinine Clearance: 180.8 mL/min (by C-G formula based on SCr of 0.78 mg/dL). Liver Function Tests: Recent Labs  Lab 10/08/22 1200 10/09/22 0620  AST 24 34  ALT 20 28  ALKPHOS 93 79  BILITOT 0.6 0.9  PROT 8.6* 7.4  ALBUMIN 4.6 4.1   Recent Labs  Lab 10/08/22 1200  LIPASE 128*   No results for input(s): "AMMONIA" in the last 168 hours. Coagulation Profile: No results for input(s): "INR", "PROTIME" in the last 168 hours. Cardiac Enzymes: No results for input(s): "CKTOTAL", "CKMB", "CKMBINDEX", "TROPONINI" in the last 168 hours. BNP (last 3 results) No results for input(s): "PROBNP" in the last 8760 hours. HbA1C: No results for input(s): "HGBA1C" in the last 72 hours. CBG: Recent Labs  Lab 10/13/22 0746 10/13/22 1149 10/13/22 1701 10/13/22 2028 10/14/22 0747  GLUCAP 96 97 78 92 81   Lipid Profile: No results for input(s): "CHOL", "HDL", "LDLCALC", "TRIG", "CHOLHDL", "LDLDIRECT" in the last 72 hours. Thyroid Function Tests: No results for input(s): "TSH", "T4TOTAL", "FREET4", "T3FREE", "THYROIDAB" in the last 72 hours. Anemia Panel: No results for input(s): "VITAMINB12", "FOLATE", "FERRITIN", "TIBC", "IRON", "RETICCTPCT" in the last 72 hours. Sepsis Labs: No results for input(s): "PROCALCITON", "LATICACIDVEN" in the last 168 hours.  No results found for this or any previous visit (from the past 240 hour(s)).       Radiology Studies: No results found.      Scheduled Meds:  enoxaparin (LOVENOX) injection  80 mg Subcutaneous Q24H   gabapentin  300 mg Oral TID   haloperidol  10 mg Oral QHS   insulin aspart  0-5 Units Subcutaneous QHS   insulin aspart  0-6 Units Subcutaneous TID WC   lamoTRIgine  400 mg Oral QHS   LORazepam  1 mg Intravenous BID   metoCLOPramide (REGLAN) injection  10 mg Intravenous Q8H   pantoprazole  40 mg Oral Daily    sodium chloride flush  3 mL Intravenous Q12H   zolpidem  5 mg Oral QHS   Continuous Infusions:  sodium chloride     0.9 % NaCl with KCl 40 mEq / L 100 mL/hr at 10/14/22 0104     LOS: 5 days    Time spent: 35 minutes    Izell Labat A Reneisha Stilley, MD [facility]   If 7PM-7AM, please contact night-coverage [email or web address]  10/14/2022, 7:52 AM

## 2022-10-14 NOTE — Plan of Care (Signed)

## 2022-10-14 NOTE — Plan of Care (Signed)
  Problem: Coping: Goal: Ability to adjust to condition or change in health will improve Outcome: Progressing   Problem: Metabolic: Goal: Ability to maintain appropriate glucose levels will improve Outcome: Progressing   Problem: Coping: Goal: Ability to adjust to condition or change in health will improve Outcome: Progressing

## 2022-10-14 NOTE — Progress Notes (Signed)
Patient was given DC instructions and all questions were answered. Patient was taken to main entrance via wheelchair.

## 2022-10-14 NOTE — Plan of Care (Signed)
  Problem: Metabolic: Goal: Ability to maintain appropriate glucose levels will improve Outcome: Progressing   

## 2023-06-25 IMAGING — DX DG ABDOMEN 1V
2 series · 2 of 2 positions shown · non-contrast
Comparison: CT abdomen pelvis 04/26/2021.

CLINICAL DATA: Abdominal pain.

EXAM:
ABDOMEN - 1 VIEW

[abdomen kub (1 of 2)]
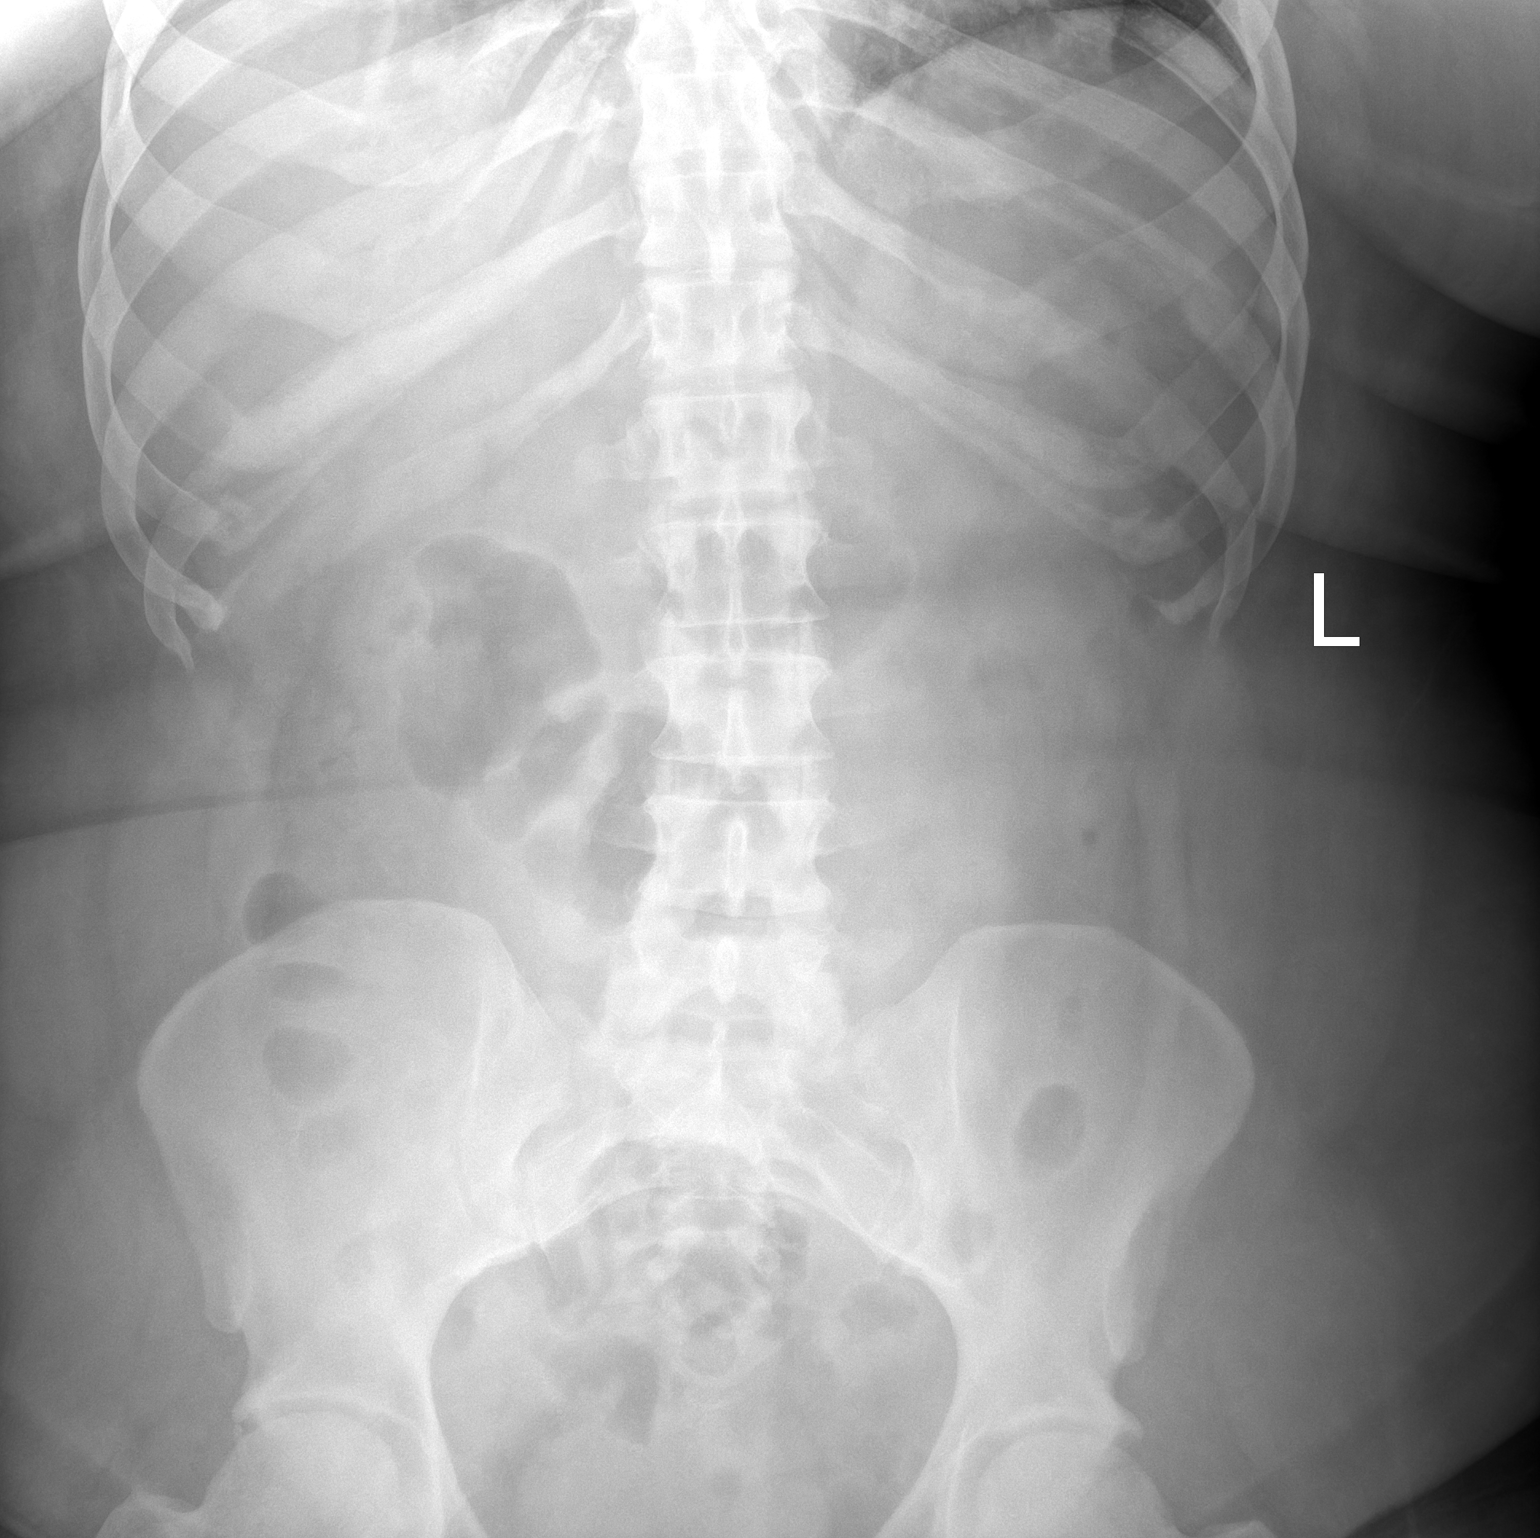

[abdomen kub (2 of 2)]
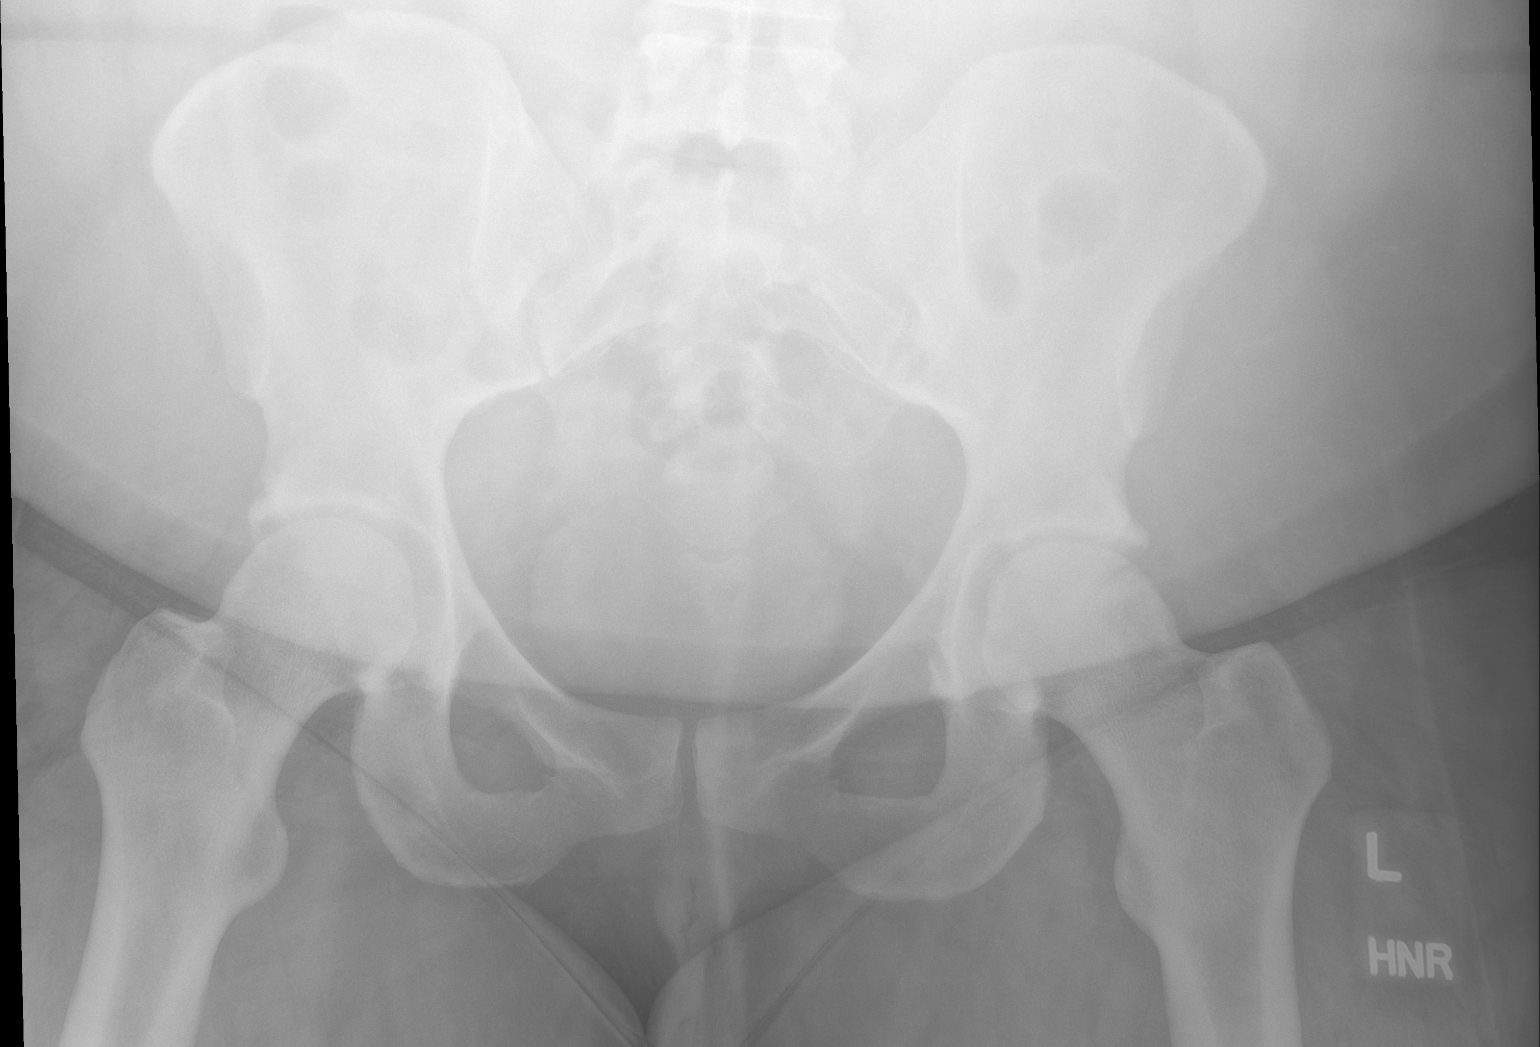

[2 of 2 positions shown; findings below may reference images not displayed]

FINDINGS: Mild gaseous prominence of a segment of proximal transverse colon.
Otherwise, no small bowel or colonic dilatation. No unexpected
radiopaque calculi.
IMPRESSION: No acute findings.

## 2023-06-26 IMAGING — CT CT ABD-PELV W/ CM
2 of 4 series · 15 of 46 positions shown, 17 images · IV contrast (OMNIPAQUE)
Comparison: 04/26/2021

CLINICAL DATA: Abdominal pain off and on for 3 months with nausea
and vomiting.

EXAM:
CT ABDOMEN AND PELVIS WITH CONTRAST
TECHNIQUE: Multidetector CT imaging of the abdomen and pelvis was performed
using the standard protocol following bolus administration of
intravenous contrast.

[Series 2: axial st · axial · 0.77mm/px · z∈[+1159,+1559]mm · 12 of 92 slices shown, 14 images]
[im 6/92  soft-tissue]
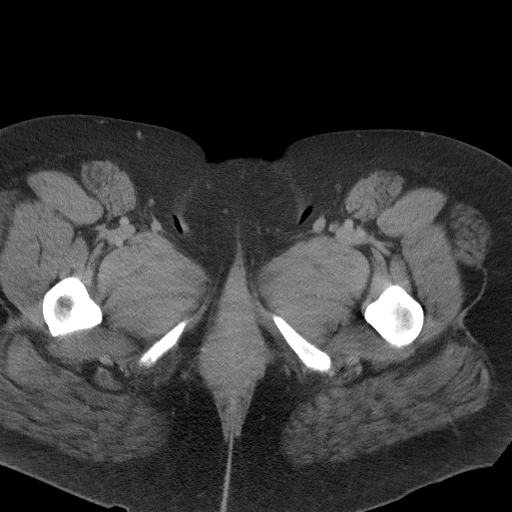
[im 6/92  bone]
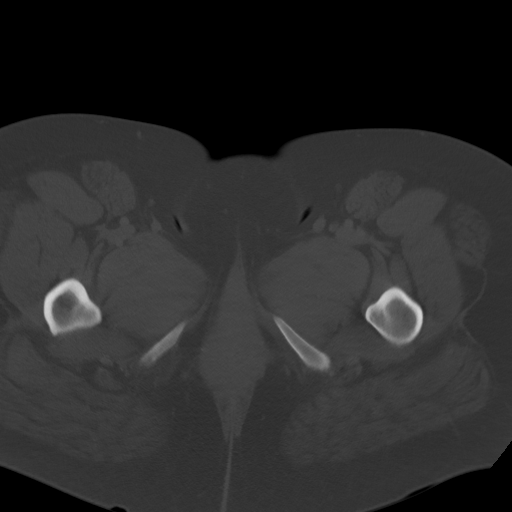
[im 12/92  soft-tissue]
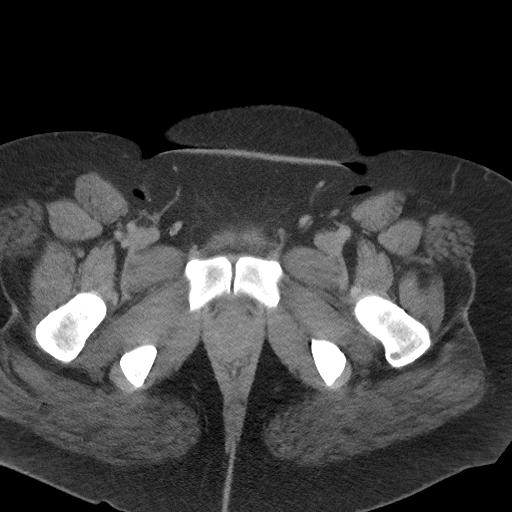
[im 23/92  soft-tissue]
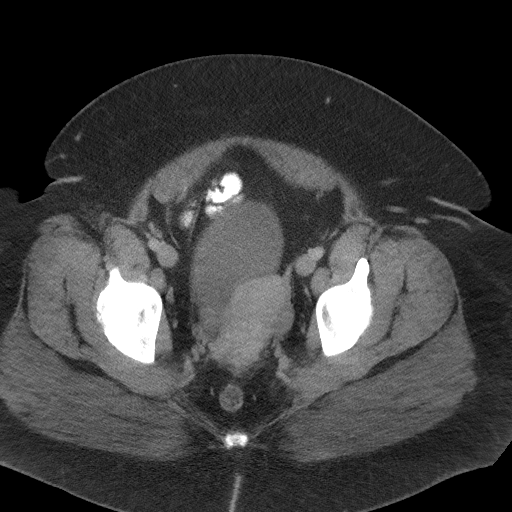
[im 29/92  soft-tissue]
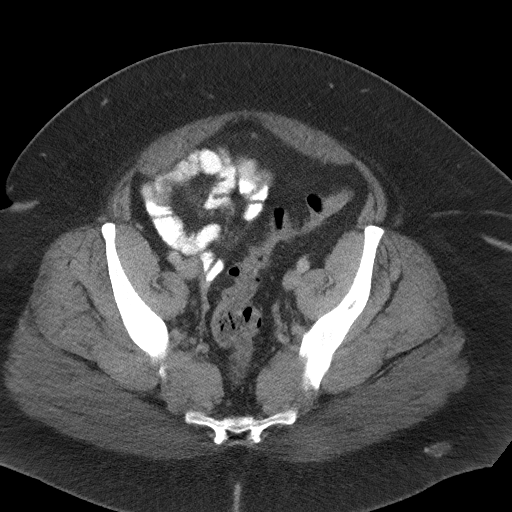
[im 35/92  soft-tissue]
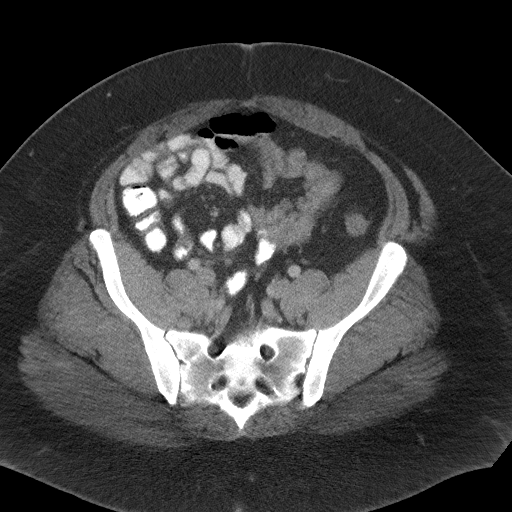
[im 40/92  soft-tissue]
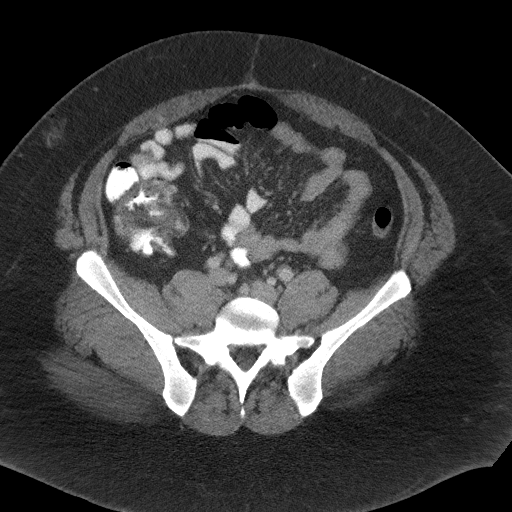
[im 52/92  soft-tissue]
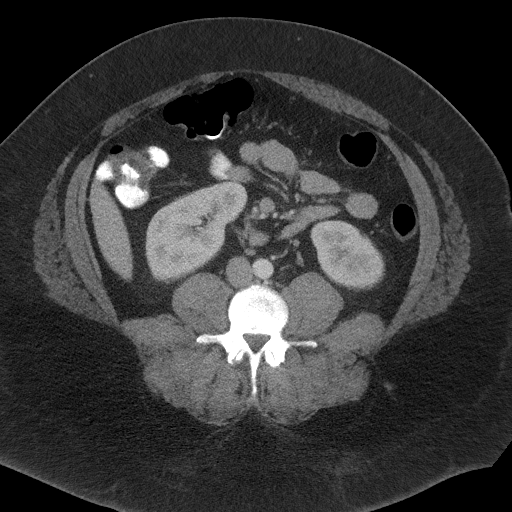
[im 57/92  soft-tissue]
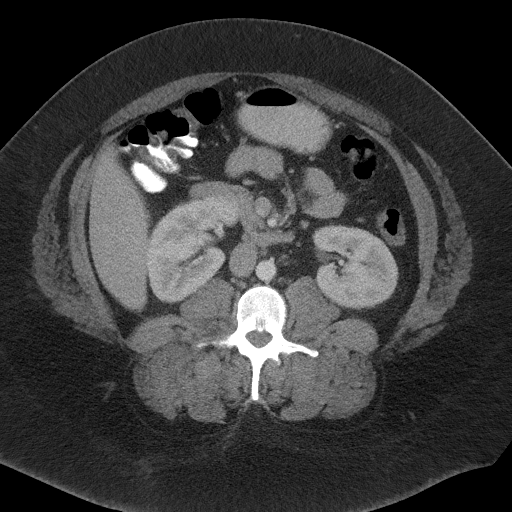
[im 63/92  soft-tissue]
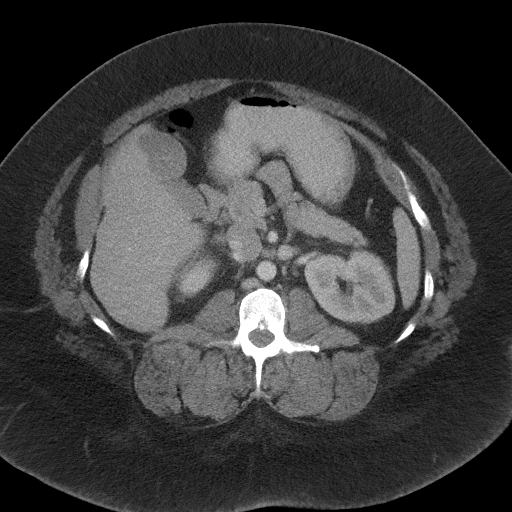
[im 63/92  bone]
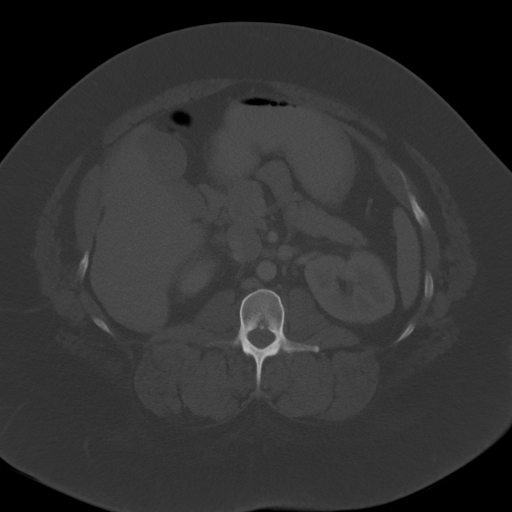
[im 69/92  soft-tissue]
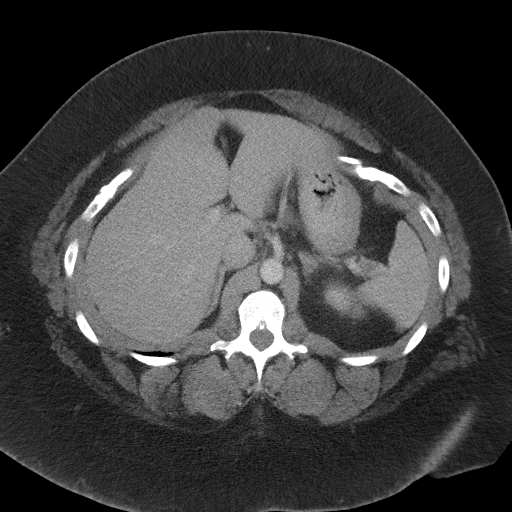
[im 80/92  soft-tissue]
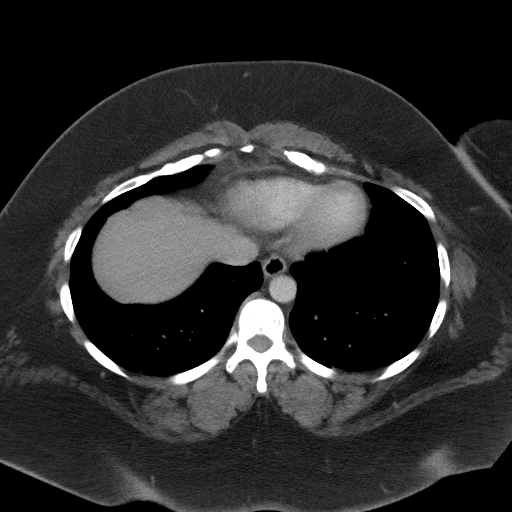
[im 86/92  soft-tissue]
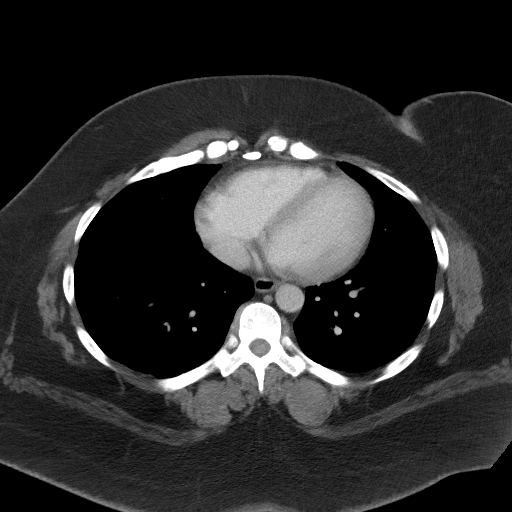

[Series 5: coronal st · coronal · 0.74mm/px · 3 of 110 slices shown]
[im 37/110  soft-tissue]
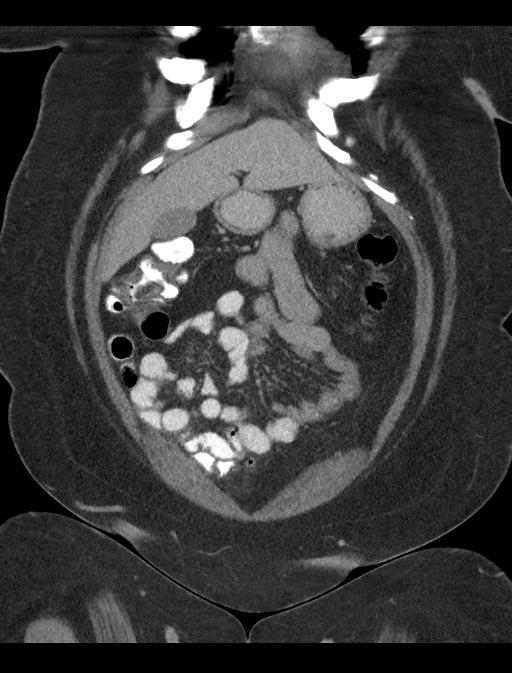
[im 49/110  soft-tissue]
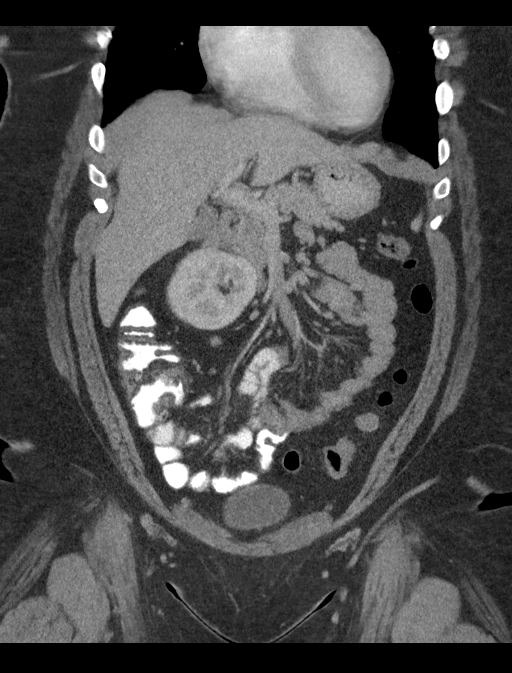
[im 61/110  soft-tissue]
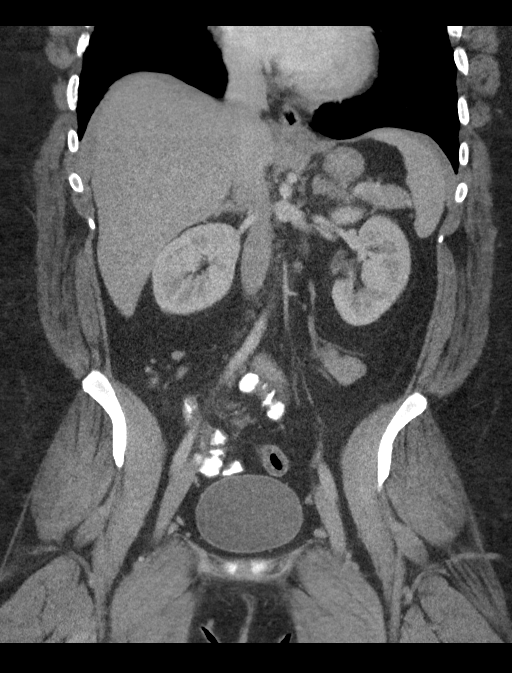

[15 of 46 positions shown; findings below may reference images not displayed]

RADIATION DOSE REDUCTION: This exam was performed according to the
departmental dose-optimization program which includes automated
exposure control, adjustment of the mA and/or kV according to
patient size and/or use of iterative reconstruction technique.

CONTRAST:  100mL OMNIPAQUE IOHEXOL 300 MG/ML  SOLN
FINDINGS: Lower chest: Subsegmental atelectasis noted in the dependent lung
bases.

Hepatobiliary: No suspicious focal abnormality within the liver
parenchyma. There is no evidence for gallstones, gallbladder wall
thickening, or pericholecystic fluid. No intrahepatic or
extrahepatic biliary dilation.

Pancreas: No focal mass lesion. No dilatation of the main duct. No
intraparenchymal cyst. No peripancreatic edema.

Spleen: No splenomegaly. No focal mass lesion.

Adrenals/Urinary Tract: No adrenal nodule or mass. Kidneys
unremarkable. No evidence for hydroureter. The urinary bladder
appears normal for the degree of distention.

Stomach/Bowel: Stomach is unremarkable. No gastric wall thickening.
No evidence of outlet obstruction. Duodenum is normally positioned
as is the ligament of Treitz. No small bowel wall thickening. No
small bowel dilatation. The terminal ileum is normal. The appendix
is normal. No gross colonic mass. No colonic wall thickening.

Vascular/Lymphatic: No abdominal aortic aneurysm. No abdominal
aortic atherosclerotic calcification. 1.7 x 1.8 cm nodal
conglomeration in the ileocolic mesentery noted on image 42/2. This
is oriented differently than on the prior study due to mobility of
the mesentery, but overall is unchanged when comparing axial imaging
today to coronal imaging previously. Additional upper normal lymph
nodes in the ileocolic mesentery are similar to prior. No
retroperitoneal lymphadenopathy. No pelvic sidewall lymphadenopathy.

Reproductive: The uterus is unremarkable.  There is no adnexal mass.

Other: No intraperitoneal free fluid.

Musculoskeletal: No worrisome lytic or sclerotic osseous
abnormality.
IMPRESSION: No acute findings in the abdomen or pelvis. Specifically, no
findings to explain the patient's history of abdominal pain with
nausea and vomiting.

Borderline to mild lymphadenopathy in the ileocolic mesentery is
stable in the 3 month interval since prior study, most suggestive of
benign/reactive etiology. Follow-up CT in 3-6 months could be used
to ensure continued stability as clinically warranted.

## 2023-06-29 IMAGING — DX DG ABD PORTABLE 1V
1 series · 1 of 1 positions shown · non-contrast
Comparison: Film from earlier in the same day.

CLINICAL DATA: Small-bowel follow-through 8 hour follow-up, initial
encounter

EXAM:
PORTABLE ABDOMEN - 1 VIEW

[abdomen kub]
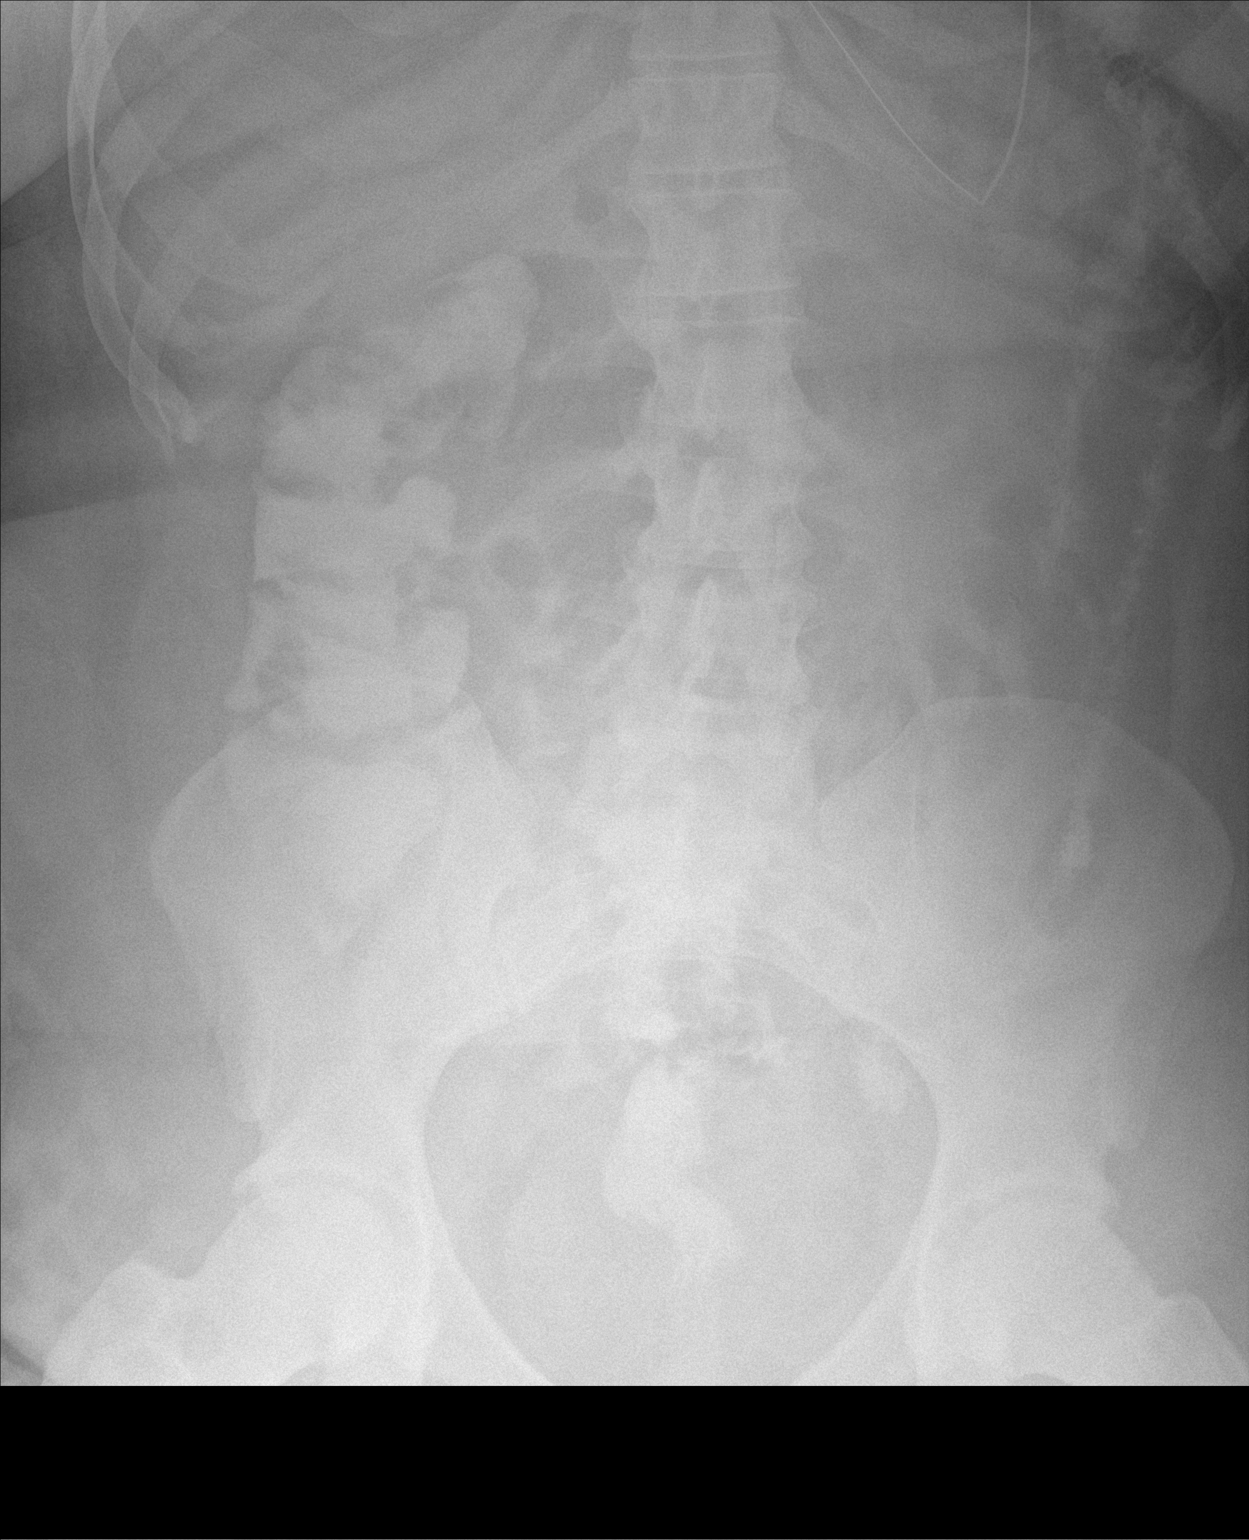

[1 of 1 positions shown; findings below may reference images not displayed]

FINDINGS: Gastric catheter is noted within the stomach. Contrast material is
noted within the colon. No obstructive changes are seen.
IMPRESSION: No evidence of small-bowel obstruction. Contrast administered now
lies within the colon.

## 2023-06-29 IMAGING — DX DG ABD PORTABLE 1V
1 series · 1 of 1 positions shown · non-contrast
Comparison: 07/16/2021

CLINICAL DATA: NG tube placement

EXAM:
PORTABLE ABDOMEN - 1 VIEW

[abdomen kub]
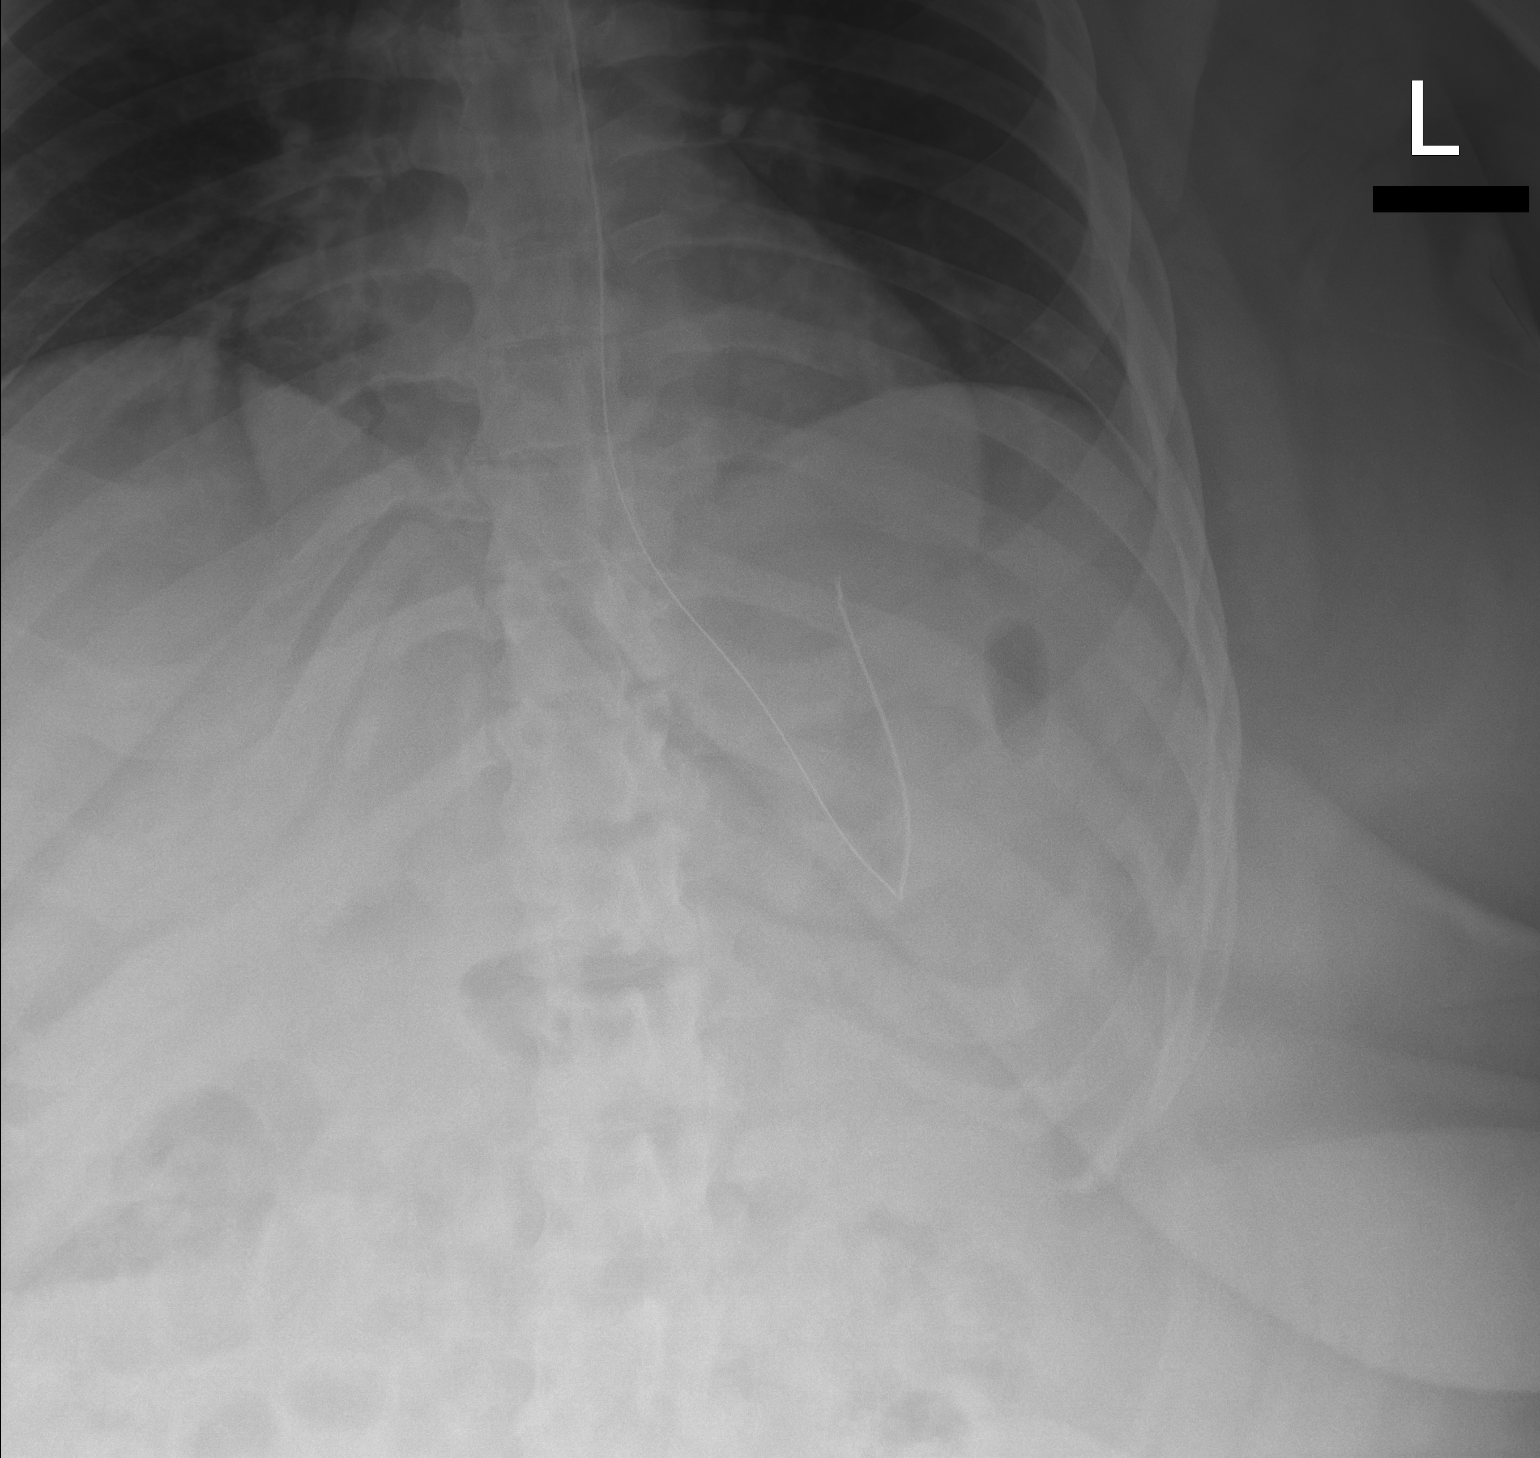

[1 of 1 positions shown; findings below may reference images not displayed]

FINDINGS: Distal portion of enteric tube is coiled within the stomach with its
tip pointing cephalad in the fundus. Bowel gas pattern in the upper
abdomen is unremarkable. Lower abdomen and pelvis are not included
in the image.
IMPRESSION: Tip of enteric tube is seen in the fundus of the stomach.
